# Patient Record
Sex: Female | Born: 1960
Health system: Southern US, Community
[De-identification: ages and names within clinical notes are randomized; demographics above are authoritative.]

## PROBLEM LIST (undated history)

## (undated) DIAGNOSIS — I10 Essential (primary) hypertension: Secondary | ICD-10-CM

## (undated) DIAGNOSIS — M1990 Unspecified osteoarthritis, unspecified site: Secondary | ICD-10-CM

## (undated) DIAGNOSIS — F988 Other specified behavioral and emotional disorders with onset usually occurring in childhood and adolescence: Secondary | ICD-10-CM

## (undated) DIAGNOSIS — L409 Psoriasis, unspecified: Secondary | ICD-10-CM

## (undated) DIAGNOSIS — G2581 Restless legs syndrome: Secondary | ICD-10-CM

## (undated) DIAGNOSIS — F32A Depression, unspecified: Secondary | ICD-10-CM

## (undated) DIAGNOSIS — K219 Gastro-esophageal reflux disease without esophagitis: Secondary | ICD-10-CM

## (undated) HISTORY — PX: TONSILLECTOMY: SUR1361

---

## 1996-03-27 HISTORY — PX: DILATION AND CURETTAGE OF UTERUS: SHX78

## 2016-01-10 DIAGNOSIS — G4709 Other insomnia: Secondary | ICD-10-CM | POA: Diagnosis not present

## 2016-01-10 DIAGNOSIS — E668 Other obesity: Secondary | ICD-10-CM | POA: Diagnosis not present

## 2016-01-10 DIAGNOSIS — F331 Major depressive disorder, recurrent, moderate: Secondary | ICD-10-CM | POA: Diagnosis not present

## 2016-01-10 DIAGNOSIS — I10 Essential (primary) hypertension: Secondary | ICD-10-CM | POA: Diagnosis not present

## 2016-01-10 DIAGNOSIS — M150 Primary generalized (osteo)arthritis: Secondary | ICD-10-CM | POA: Diagnosis not present

## 2016-01-10 DIAGNOSIS — F5081 Binge eating disorder: Secondary | ICD-10-CM | POA: Diagnosis not present

## 2016-01-18 ENCOUNTER — Ambulatory Visit: Payer: Self-pay | Admitting: Family Medicine

## 2016-02-07 ENCOUNTER — Encounter (INDEPENDENT_AMBULATORY_CARE_PROVIDER_SITE_OTHER): Payer: Self-pay | Admitting: Physician Assistant

## 2016-02-07 ENCOUNTER — Ambulatory Visit (INDEPENDENT_AMBULATORY_CARE_PROVIDER_SITE_OTHER): Payer: 59 | Admitting: Physician Assistant

## 2016-02-07 ENCOUNTER — Ambulatory Visit (INDEPENDENT_AMBULATORY_CARE_PROVIDER_SITE_OTHER): Payer: Self-pay

## 2016-02-07 VITALS — Ht 64.0 in | Wt 190.0 lb

## 2016-02-07 DIAGNOSIS — M25561 Pain in right knee: Secondary | ICD-10-CM

## 2016-02-07 MED ORDER — LIDOCAINE HCL 1 % IJ SOLN
1.0000 mL | INTRAMUSCULAR | Status: AC | PRN
  Administered 2016-02-07: 1 mL

## 2016-02-07 MED ORDER — METHYLPREDNISOLONE ACETATE 40 MG/ML IJ SUSP
40.0000 mg | INTRAMUSCULAR | Status: AC | PRN
  Administered 2016-02-07: 40 mg via INTRA_ARTICULAR

## 2016-02-07 NOTE — Progress Notes (Signed)
Office Visit Note   Patient: [name]           Date of Birth: 1960/06/20           MRN: [id number] Visit Date: 02/07/2016              Requested by: No referring provider defined for this encounter. PCP: No primary care provider on file.   Assessment & Plan: Visit Diagnoses:  1. Acute pain of right knee     Plan: Quad strengthening . If knee pain persist would recommend MRI of the right knee to rule out medial meniscal tear. Re- Examination in 2 weeks.  Follow-Up Instructions: Return in about 2 weeks (around 02/21/2016).   Orders:  Orders Placed This Encounter  Procedures  . XR Knee 1-2 Views Right   No orders of the defined types were placed in this encounter.     Procedures: Large Joint Inj Date/Time: 02/07/2016 11:49 AM Performed by: Kirtland BouchardLARK, Saraia Platner W Authorized by: Kirtland BouchardLARK, Roberto Hlavaty W   Consent Given by:  Patient Indications:  Pain Location:  Knee Site:  R knee Needle Size:  22 G Approach:  Anterolateral Ultrasound Guidance: No   Fluoroscopic Guidance: No   Medications:  1 mL lidocaine 1 %; 40 mg methylPREDNISolone acetate 40 MG/ML Aspiration Attempted: Yes   Aspirate amount (mL):  12 Patient tolerance:  Patient tolerated the procedure well with no immediate complications     Clinical Data: No additional findings.   Subjective: Chief Complaint  Patient presents with  . Right Knee - Pain    Pain in right knee for about 8 weeks. States pain came on after using bike and treadmill for about 4 days in row. States that her knee pops in and out, limited range of motion, and swelling. Wears hinge knee brace while working. She notes no catching locking or painful popping. 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    Review of Systems See HPI  Objective: Vital Signs: Ht 5\' 4"  (1.626 m)   Wt 190 lb (86.2 kg)   BMI 32.61 kg/m   Physical Exam  Constitutional: She is oriented to person, place, and time. She appears  well-developed and well-nourished.  Pulmonary/Chest: Effort normal.  Musculoskeletal:       Right knee: She exhibits effusion.       Left knee: She exhibits no effusion.  Neurological: She is alert and oriented to person, place, and time.  Psychiatric: She has a normal mood and affect.    Right Knee Exam   Tenderness  The patient is experiencing tenderness in the medial joint line.  Range of Motion  Extension: normal  Flexion: normal   Tests  McMurray:  Medial - negative Lateral - negative Varus: negative Valgus: negative  Other  Erythema: present Swelling: moderate Other tests: effusion present   Left Knee Exam  Left knee exam is normal.  Range of Motion  Extension: normal  Flexion: normal   Tests  McMurray:  Medial - negative Lateral - negative Varus: negative Valgus: negative  Other  Effusion: no effusion present      Specialty Comments:  No specialty comments available.  Imaging: Xr Knee 1-2 Views Right  Result Date: 02/07/2016  AP view bilateral knees and lateral view of the right knee: No acute fractures. Left knee with near bone-on-bone medial compartment and lateral compartment spurring. Right knee with moderate narrowing of the lateral compartment. No other bony abnormalities.  PMFS History: There are no active problems to display for this patient.  No past medical history on file.  No family history on file.  No past surgical history on file. Social History   Occupational History  . Not on file.   Social History Main Topics  . Smoking status: Never Smoker  . Smokeless tobacco: Never Used  . Alcohol use No  . Drug use: No  . Sexual activity: Not on file

## 2016-02-23 ENCOUNTER — Ambulatory Visit (INDEPENDENT_AMBULATORY_CARE_PROVIDER_SITE_OTHER): Payer: 59 | Admitting: Physician Assistant

## 2016-08-10 ENCOUNTER — Ambulatory Visit (INDEPENDENT_AMBULATORY_CARE_PROVIDER_SITE_OTHER): Payer: 59 | Admitting: Physician Assistant

## 2016-09-07 DIAGNOSIS — E669 Obesity, unspecified: Secondary | ICD-10-CM | POA: Diagnosis not present

## 2016-09-07 DIAGNOSIS — I10 Essential (primary) hypertension: Secondary | ICD-10-CM | POA: Diagnosis not present

## 2016-09-07 DIAGNOSIS — G4700 Insomnia, unspecified: Secondary | ICD-10-CM | POA: Diagnosis not present

## 2016-09-07 DIAGNOSIS — F329 Major depressive disorder, single episode, unspecified: Secondary | ICD-10-CM | POA: Diagnosis not present

## 2016-09-07 DIAGNOSIS — Z7689 Persons encountering health services in other specified circumstances: Secondary | ICD-10-CM | POA: Diagnosis not present

## 2016-09-07 DIAGNOSIS — R238 Other skin changes: Secondary | ICD-10-CM | POA: Diagnosis not present

## 2016-09-07 DIAGNOSIS — Z6835 Body mass index (BMI) 35.0-35.9, adult: Secondary | ICD-10-CM | POA: Diagnosis not present

## 2016-09-07 DIAGNOSIS — F5081 Binge eating disorder: Secondary | ICD-10-CM | POA: Diagnosis not present

## 2016-09-08 ENCOUNTER — Other Ambulatory Visit: Payer: Self-pay

## 2016-09-08 ENCOUNTER — Ambulatory Visit (HOSPITAL_BASED_OUTPATIENT_CLINIC_OR_DEPARTMENT_OTHER)
Admission: RE | Admit: 2016-09-08 | Discharge: 2016-09-08 | Disposition: A | Discharge status: Home / Self Care | Payer: 59 | Source: Ambulatory Visit | Attending: Family Medicine | Admitting: Family Medicine

## 2016-09-08 ENCOUNTER — Other Ambulatory Visit (HOSPITAL_BASED_OUTPATIENT_CLINIC_OR_DEPARTMENT_OTHER): Payer: Self-pay | Admitting: Family Medicine

## 2016-09-08 ENCOUNTER — Encounter (HOSPITAL_BASED_OUTPATIENT_CLINIC_OR_DEPARTMENT_OTHER): Payer: Self-pay

## 2016-09-08 DIAGNOSIS — Z1231 Encounter for screening mammogram for malignant neoplasm of breast: Secondary | ICD-10-CM

## 2016-10-06 DIAGNOSIS — E782 Mixed hyperlipidemia: Secondary | ICD-10-CM | POA: Diagnosis not present

## 2016-10-06 DIAGNOSIS — N951 Menopausal and female climacteric states: Secondary | ICD-10-CM | POA: Diagnosis not present

## 2016-10-06 DIAGNOSIS — L409 Psoriasis, unspecified: Secondary | ICD-10-CM | POA: Diagnosis not present

## 2016-10-06 DIAGNOSIS — E559 Vitamin D deficiency, unspecified: Secondary | ICD-10-CM | POA: Diagnosis not present

## 2016-10-06 DIAGNOSIS — Z7689 Persons encountering health services in other specified circumstances: Secondary | ICD-10-CM | POA: Diagnosis not present

## 2016-10-06 DIAGNOSIS — R41840 Attention and concentration deficit: Secondary | ICD-10-CM | POA: Diagnosis not present

## 2016-10-06 DIAGNOSIS — F5081 Binge eating disorder: Secondary | ICD-10-CM | POA: Diagnosis not present

## 2016-10-06 DIAGNOSIS — G4700 Insomnia, unspecified: Secondary | ICD-10-CM | POA: Diagnosis not present

## 2016-10-06 DIAGNOSIS — Z008 Encounter for other general examination: Secondary | ICD-10-CM | POA: Diagnosis not present

## 2016-10-06 DIAGNOSIS — I10 Essential (primary) hypertension: Secondary | ICD-10-CM | POA: Diagnosis not present

## 2016-12-28 ENCOUNTER — Ambulatory Visit: Payer: 59 | Admitting: Osteopathic Medicine

## 2017-01-09 ENCOUNTER — Ambulatory Visit: Payer: 59 | Admitting: Osteopathic Medicine

## 2017-01-09 DIAGNOSIS — Z0189 Encounter for other specified special examinations: Secondary | ICD-10-CM

## 2017-01-26 MED FILL — AMPHETAMINE SALTS 30 MG TAB: 30 | 30 days supply | Qty: 30 | Fill #0

## 2017-03-26 MED FILL — cloNIDine HCL 0.2 MG TABS: 0.2 | 90 days supply | Qty: 90 | Fill #0

## 2017-05-01 ENCOUNTER — Encounter (INDEPENDENT_AMBULATORY_CARE_PROVIDER_SITE_OTHER): Payer: 59

## 2017-05-08 ENCOUNTER — Ambulatory Visit (INDEPENDENT_AMBULATORY_CARE_PROVIDER_SITE_OTHER): Payer: 59 | Admitting: Family Medicine

## 2017-06-05 ENCOUNTER — Ambulatory Visit: Payer: 59 | Admitting: Family Medicine

## 2017-06-05 ENCOUNTER — Telehealth: Payer: Self-pay | Admitting: *Deleted

## 2017-06-05 DIAGNOSIS — Z0289 Encounter for other administrative examinations: Secondary | ICD-10-CM

## 2017-06-05 NOTE — Telephone Encounter (Signed)
Copied from CRM [id number]. Topic: Quick Communication - Appointment Cancellation >> Jun 05, 2017 12:11 PM Cecelia ByarsGreen, Temeka L, RMA wrote: Patient called to cancel appointment scheduled for 06/05/17 @ 2:15 pm. Patient has rescheduled their appointment.

## 2017-07-02 ENCOUNTER — Encounter (INDEPENDENT_AMBULATORY_CARE_PROVIDER_SITE_OTHER): Payer: Self-pay

## 2017-07-03 ENCOUNTER — Ambulatory Visit: Payer: 59 | Admitting: Family Medicine

## 2017-09-14 ENCOUNTER — Ambulatory Visit: Payer: 59 | Admitting: Podiatry

## 2017-10-29 ENCOUNTER — Ambulatory Visit (HOSPITAL_BASED_OUTPATIENT_CLINIC_OR_DEPARTMENT_OTHER)
Admission: RE | Admit: 2017-10-29 | Discharge: 2017-10-29 | Disposition: A | Discharge status: Home / Self Care | Payer: 59 | Source: Ambulatory Visit | Attending: Family Medicine | Admitting: Family Medicine

## 2017-10-29 ENCOUNTER — Other Ambulatory Visit (HOSPITAL_BASED_OUTPATIENT_CLINIC_OR_DEPARTMENT_OTHER): Payer: Self-pay | Admitting: Family Medicine

## 2017-10-29 DIAGNOSIS — Z1231 Encounter for screening mammogram for malignant neoplasm of breast: Secondary | ICD-10-CM | POA: Diagnosis not present

## 2017-10-31 DIAGNOSIS — N951 Menopausal and female climacteric states: Secondary | ICD-10-CM | POA: Diagnosis not present

## 2017-10-31 DIAGNOSIS — R635 Abnormal weight gain: Secondary | ICD-10-CM | POA: Diagnosis not present

## 2017-11-01 DIAGNOSIS — I10 Essential (primary) hypertension: Secondary | ICD-10-CM | POA: Diagnosis not present

## 2017-11-01 DIAGNOSIS — E669 Obesity, unspecified: Secondary | ICD-10-CM | POA: Diagnosis not present

## 2017-11-01 DIAGNOSIS — F329 Major depressive disorder, single episode, unspecified: Secondary | ICD-10-CM | POA: Diagnosis not present

## 2017-11-01 DIAGNOSIS — M2550 Pain in unspecified joint: Secondary | ICD-10-CM | POA: Diagnosis not present

## 2017-11-01 DIAGNOSIS — R635 Abnormal weight gain: Secondary | ICD-10-CM | POA: Diagnosis not present

## 2017-11-01 DIAGNOSIS — Z1331 Encounter for screening for depression: Secondary | ICD-10-CM | POA: Diagnosis not present

## 2017-11-01 DIAGNOSIS — Z1339 Encounter for screening examination for other mental health and behavioral disorders: Secondary | ICD-10-CM | POA: Diagnosis not present

## 2017-11-01 DIAGNOSIS — R4586 Emotional lability: Secondary | ICD-10-CM | POA: Diagnosis not present

## 2017-11-01 DIAGNOSIS — R6882 Decreased libido: Secondary | ICD-10-CM | POA: Diagnosis not present

## 2017-11-01 DIAGNOSIS — R5383 Other fatigue: Secondary | ICD-10-CM | POA: Diagnosis not present

## 2017-11-06 ENCOUNTER — Ambulatory Visit: Payer: 59 | Admitting: Family Medicine

## 2017-11-06 ENCOUNTER — Encounter: Payer: Self-pay | Admitting: Family Medicine

## 2017-11-06 VITALS — BP 167/100 | HR 84 | Ht 63.0 in | Wt 224.0 lb

## 2017-11-06 DIAGNOSIS — M25562 Pain in left knee: Secondary | ICD-10-CM | POA: Diagnosis not present

## 2017-11-06 DIAGNOSIS — G8929 Other chronic pain: Secondary | ICD-10-CM | POA: Diagnosis not present

## 2017-11-06 MED ORDER — METHYLPREDNISOLONE ACETATE 40 MG/ML IJ SUSP
40.0000 mg | Freq: Once | INTRAMUSCULAR | Status: AC
  Administered 2017-11-06: 40 mg via INTRA_ARTICULAR

## 2017-11-06 NOTE — Patient Instructions (Signed)
Your pain is due to arthritis. These are the different medications you can take for this: Tylenol 500mg  1-2 tabs three times a day for pain. Capsaicin, aspercreme, or biofreeze topically up to four times a day may also help with pain. Some supplements that may help for arthritis: Boswellia extract, curcumin, pycnogenol Aleve 1-2 tabs twice a day with food Cortisone injections are an option - you were given this today. If cortisone injections do not help, there are different types of shots that may help but they take longer to take effect. It's important that you continue to stay active. Straight leg raises, knee extensions 3 sets of 10 once a day (add ankle weight if these become too easy). Consider physical therapy to strengthen muscles around the joint that hurts to take pressure off of the joint itself. Shoe inserts with good arch support may be helpful. Heat or ice 15 minutes at a time 3-4 times a day as needed to help with pain. Water aerobics and cycling with low resistance are the best two types of exercise for arthritis though any exercise is ok as long as it doesn't worsen the pain. Wait about 2 weeks before you get back on the treadmill - only do it every other day start at 15-20 minutes, increase by 5 minutes each time you exercise if tolerated. Avoid squats, lunges, leg press. Follow up with me in 1 month.

## 2017-11-07 ENCOUNTER — Encounter: Payer: Self-pay | Admitting: Family Medicine

## 2017-11-07 NOTE — Progress Notes (Signed)
PCP: System, Pcp Not In  Subjective:   HPI: Patient is a 57 y.o. female here for left knee pain.  Patient reports that she has had problems with her left knee off and on the past 2 years though it has been much worse the past 3 weeks. She has [facility] that showed bone-on-bone arthritis medially. 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 Pain is medial with some associated swelling. She is tried a knee brace and Aleve with mild benefit. No skin changes or numbness.  History reviewed. No pertinent past medical history.  Current Outpatient Medications on File Prior to Visit  Medication Sig Dispense Refill  . traMADol (ULTRAM) 50 MG tablet TK 1 T PO BID PRN P  1   No current facility-administered medications on file prior to visit.     History reviewed. No pertinent surgical history.  No Known Allergies  Social History   Socioeconomic History  . Marital status: Divorced    Spouse name: Not on file  . Number of children: Not on file  . Years of education: Not on file  . Highest education level: Not on file  Occupational History  . Not on file  Social Needs  . Financial resource strain: Not on file  . Food insecurity:    Worry: Not on file    Inability: Not on file  . Transportation needs:    Medical: Not on file    Non-medical: Not on file  Tobacco Use  . Smoking status: Never Smoker  . Smokeless tobacco: Never Used  Substance and Sexual Activity  . Alcohol use: No  . Drug use: No  . Sexual activity: Not on file  Lifestyle  . Physical activity:    Days per week: Not on file    Minutes per session: Not on file  . Stress: Not on file  Relationships  . Social connections:    Talks on phone: Not on file    Gets together: Not on file    Attends religious service: Not on file    Active member of club or organization: Not on file    Attends meetings of  clubs or organizations: Not on file    Relationship status: Not on file  . Intimate partner violence:    Fear of current or ex partner: Not on file    Emotionally abused: Not on file    Physically abused: Not on file    Forced sexual activity: Not on file  Other Topics Concern  . Not on file  Social History Narrative  . Not on file    History reviewed. No pertinent family history.  BP (!) 167/100   Pulse 84   Ht 5\' 3"  (1.6 m)   Wt 224 lb (101.6 kg)   BMI 39.68 kg/m   Review of Systems: See HPI above.     Objective:  Physical Exam:  Gen: NAD, comfortable in exam room  Left knee: No gross deformity, ecchymoses.  Mild effusion. TTP medial joint line. FROM with 5/5 strength flexion and extension. Negative ant/post drawers. Negative valgus/varus testing. Negative lachmanns. Negative mcmurrays, apleys, patellar apprehension. NV intact distally.  Right knee: No deformity. FROM with 5/5 strength. No tenderness to palpation. NVI distally.   Assessment & Plan:  1. Left knee pain - secondary to known arthritis which is at least moderate based on muscular skeletal ultrasound.  Prior radiographs about 1 to 2 years ago showed severe end-stage arthritis.  She was given an intra-articular injection today Depo-Medrol.  We discussed Tylenol, topical medications, supplements that might help, Aleve.  She will consider Visco supplementation.  Shown exercises to do daily.  Heat or ice.  We discussed return to walking on the treadmill and how to go about this.  Avoid squats, lunges, leg press.  Follow-up in 1 month.  After informed written consent timeout was performed, patient was seated on exam table. Left knee was prepped with alcohol swab and utilizing anteromedial approach, patient's left knee was injected intraarticularly with 3:1 bupivicaine: depomedrol. Patient tolerated the procedure well without immediate complications.

## 2017-11-12 DIAGNOSIS — E669 Obesity, unspecified: Secondary | ICD-10-CM | POA: Diagnosis not present

## 2017-11-12 DIAGNOSIS — I10 Essential (primary) hypertension: Secondary | ICD-10-CM | POA: Diagnosis not present

## 2017-11-12 DIAGNOSIS — Z713 Dietary counseling and surveillance: Secondary | ICD-10-CM | POA: Diagnosis not present

## 2017-12-10 ENCOUNTER — Ambulatory Visit: Payer: 59 | Admitting: Family Medicine

## 2017-12-25 ENCOUNTER — Encounter: Payer: Self-pay | Admitting: Medical

## 2017-12-25 ENCOUNTER — Ambulatory Visit: Payer: 59 | Admitting: Medical

## 2017-12-25 VITALS — BP 153/85 | HR 99 | Temp 98.4°F | Ht 64.0 in | Wt 218.4 lb

## 2017-12-25 DIAGNOSIS — F419 Anxiety disorder, unspecified: Secondary | ICD-10-CM

## 2017-12-25 DIAGNOSIS — Z113 Encounter for screening for infections with a predominantly sexual mode of transmission: Secondary | ICD-10-CM | POA: Diagnosis not present

## 2017-12-25 DIAGNOSIS — I10 Essential (primary) hypertension: Secondary | ICD-10-CM

## 2017-12-25 DIAGNOSIS — M25541 Pain in joints of right hand: Secondary | ICD-10-CM | POA: Diagnosis not present

## 2017-12-25 DIAGNOSIS — F32A Depression, unspecified: Secondary | ICD-10-CM

## 2017-12-25 DIAGNOSIS — M25542 Pain in joints of left hand: Secondary | ICD-10-CM | POA: Diagnosis not present

## 2017-12-25 DIAGNOSIS — F988 Other specified behavioral and emotional disorders with onset usually occurring in childhood and adolescence: Secondary | ICD-10-CM

## 2017-12-25 DIAGNOSIS — Z124 Encounter for screening for malignant neoplasm of cervix: Secondary | ICD-10-CM | POA: Diagnosis not present

## 2017-12-25 DIAGNOSIS — F329 Major depressive disorder, single episode, unspecified: Secondary | ICD-10-CM

## 2017-12-25 MED ORDER — BUPROPION HCL ER (SR) 150 MG PO TB12: 150.0000 mg | ORAL_TABLET | Freq: Two times a day (BID) | ORAL | 0 refills | Status: DC

## 2017-12-25 MED ORDER — FLUOXETINE HCL 40 MG PO CAPS: 40.0000 mg | ORAL_CAPSULE | Freq: Every day | ORAL | 0 refills | Status: DC

## 2017-12-25 MED ORDER — LISINOPRIL 20 MG PO TABS: 20.0000 mg | ORAL_TABLET | Freq: Every day | ORAL | 3 refills | Status: DC

## 2017-12-25 MED ORDER — QUETIAPINE FUMARATE 50 MG PO TABS: 50.0000 mg | ORAL_TABLET | Freq: Every day | ORAL | 0 refills | Status: DC

## 2017-12-25 MED FILL — BUPROPION SR 150 MG TABLET: 150 | 90 days supply | Qty: 180 | Fill #0

## 2017-12-25 MED FILL — FLUoxetine HCL 40 MG CAPS: 40 | 90 days supply | Qty: 90 | Fill #0

## 2017-12-25 MED FILL — LISINOPRIL 20 MG TABLET: 20 | 90 days supply | Qty: 90 | Fill #0

## 2017-12-25 MED FILL — QUETIAPINE FUMARATE 50 MG T: 50 | 90 days supply | Qty: 90 | Fill #0

## 2017-12-25 NOTE — Progress Notes (Signed)
Subjective:    Patient ID: Bryna Colander, female    DOB: 13-Oct-1960, 57 y.o.   MRN: [id number]  HPI  Pt in for first time. She is new to area. She works as ED Engineer, civil (consulting). She does not work out regularly.    Pt does had hx of knee arthritis. Pt sees Dr. Pearletha Forge. Pt has some recent left shin pain about 2 weeks ago. She states after knee injection.  Pt bp is moderate high today. She states 160/110 at work in ED. She been out of bp med since more than a year/15 months.  Pt has hx of depression, anxiety and ADD. Pt states mood overall stable but know she feels better and functions better with her meds.  Pt did see [facility] to try to loose weight. Pt had labs done. She will bring those in and we can scan.   She also reports wrist and hand pain bilaterally. More pain in last year.   Pt declines gi referral for colonosocopy presently.  Pt states menopausal.  Review of Systems  Constitutional: Negative for chills, fatigue and fever.  HENT: Negative for congestion, drooling, ear pain, facial swelling, hearing loss, sinus pressure and sinus pain.   Respiratory: Negative for cough, chest tightness, shortness of breath and wheezing.   Cardiovascular: Negative for chest pain and palpitations.  Gastrointestinal: Negative for abdominal pain.  Genitourinary: Negative for decreased urine volume, difficulty urinating, flank pain, frequency and pelvic pain.  Musculoskeletal: Negative for back pain, joint swelling, neck pain and neck stiffness.  Skin: Negative for rash.  Neurological: Negative for dizziness, tremors, seizures, syncope, weakness, numbness and headaches.  Hematological: Negative for adenopathy. Does not bruise/bleed easily.  Psychiatric/Behavioral: Positive for dysphoric mood and sleep disturbance. Negative for suicidal ideas. The patient is nervous/anxious.        Controlled with meds. Pt assures me large number of tabs not risk. She is always relatively stable.    No past medical history  on file.   Social History   Socioeconomic History  . Marital status: Divorced    Spouse name: Not on file  . Number of children: Not on file  . Years of education: Not on file  . Highest education level: Not on file  Occupational History  . Not on file  Social Needs  . Financial resource strain: Not on file  . Food insecurity:    Worry: Not on file    Inability: Not on file  . Transportation needs:    Medical: Not on file    Non-medical: Not on file  Tobacco Use  . Smoking status: Never Smoker  . Smokeless tobacco: Never Used  Substance and Sexual Activity  . Alcohol use: No  . Drug use: No  . Sexual activity: Not on file  Lifestyle  . Physical activity:    Days per week: Not on file    Minutes per session: Not on file  . Stress: Not on file  Relationships  . Social connections:    Talks on phone: Not on file    Gets together: Not on file    Attends religious service: Not on file    Active member of club or organization: Not on file    Attends meetings of clubs or organizations: Not on file    Relationship status: Not on file  . Intimate partner violence:    Fear of current or ex partner: Not on file    Emotionally abused: Not on file    Physically  abused: Not on file    Forced sexual activity: Not on file  Other Topics Concern  . Not on file  Social History Narrative  . Not on file    No past surgical history on file.  No family history on file.  No Known Allergies  No current outpatient medications on file prior to visit.   No current facility-administered medications on file prior to visit.     BP (!) 143/81 (BP Location: Left Arm, Patient Position: Sitting, Cuff Size: Large)   Pulse 99   Temp 98.4 F (36.9 C) (Oral)   Ht 5\' 4"  (1.626 m) Comment: Per patient  Wt 218 lb 6.4 oz (99.1 kg)   LMP 12/25/2012 (Approximate)   SpO2 99%   BMI 37.49 kg/m       Objective:   Physical Exam  General Mental Status- Alert. General Appearance- Not in  acute distress.   Skin General: Color- Normal Color. Moisture- Normal Moisture.  Neck Carotid Arteries- Normal color. Moisture- Normal Moisture. No carotid bruits. No JVD.  Chest and Lung Exam Auscultation: Breath Sounds:-Normal.  Cardiovascular Auscultation:Rythm- Regular. Murmurs & Other Heart Sounds:Auscultation of the heart reveals- No Murmurs.  Abdomen Inspection:-Inspeection Normal. Palpation/Percussion:Note:No mass. Palpation and Percussion of the abdomen reveal- Non Tender, Non Distended + BS, no rebound or guarding.   Neurologic Cranial Nerve exam:- CN III-XII intact(No nystagmus), symmetric smile. Strength:- 5/5 equal and symmetric strength both upper and lower extremities.      Assessment & Plan:  Your blood pressure is moderately elevated presently but you have not been on your medications.  So I did refill your lisinopril.  For history of ADD, I did refill your Wellbutrin.  For history of anxiety and depression, I did refill your fluoxetine and Seroquel.  For recent arthralgias of both hands over the past month, I did place future arthritis panel.  When those studies are back may refer to rheumatologist.  Also can refer to rheumatologist even if labs are negative particularly if you worsen clinically.  For STD screening, did place HIV future lab.  For cervical cancer screening, did place referral to gynecologist.  Let me know when you are ready to be referred to gastroenterology for screening colonoscopy.  Follow-up in 3 months or as needed.  But please my chart me in 1 month let me know how you feel or sooner.  Esperanza Richters, PA-C

## 2017-12-25 NOTE — Patient Instructions (Signed)
Your blood pressure is moderately elevated presently but you have not been on your medications.  So I did refill your lisinopril.  For history of ADD, I did refill your Wellbutrin.  For history of anxiety and depression, I did refill your fluoxetine and Seroquel.  For recent arthralgias of both hands over the past month, I did place future arthritis panel.  When those studies are back may refer to rheumatologist.  Also can refer to rheumatologist even if labs are negative particularly if you worsen clinically.  For STD screening, did place HIV future lab.  For cervical cancer screening, did place referral to gynecologist.  Let me know when you are ready to be referred to gastroenterology for screening colonoscopy.  Follow-up in 3 months or as needed.  But please my chart me in 1 month let me know how you feel or sooner.

## 2017-12-27 ENCOUNTER — Encounter: Payer: Self-pay | Admitting: Family Medicine

## 2017-12-28 ENCOUNTER — Other Ambulatory Visit: Payer: 59

## 2017-12-28 ENCOUNTER — Ambulatory Visit: Payer: 59 | Admitting: Family Medicine

## 2018-01-04 ENCOUNTER — Other Ambulatory Visit: Payer: 59

## 2018-01-09 ENCOUNTER — Other Ambulatory Visit: Payer: 59

## 2018-01-13 ENCOUNTER — Encounter: Payer: Self-pay | Admitting: Family Medicine

## 2018-01-14 ENCOUNTER — Ambulatory Visit: Payer: 59 | Admitting: Family Medicine

## 2018-01-15 ENCOUNTER — Other Ambulatory Visit: Payer: 59

## 2018-03-06 ENCOUNTER — Encounter: Payer: Self-pay | Admitting: Medical

## 2018-03-07 ENCOUNTER — Ambulatory Visit: Payer: 59 | Admitting: Medical

## 2018-03-10 ENCOUNTER — Other Ambulatory Visit: Payer: Self-pay | Admitting: Medical

## 2018-03-14 MED ORDER — BUPROPION HCL ER (SR) 150 MG PO TB12: 150.0000 mg | ORAL_TABLET | Freq: Two times a day (BID) | ORAL | 0 refills | Status: DC

## 2018-03-14 MED ORDER — QUETIAPINE FUMARATE 50 MG PO TABS: ORAL_TABLET | ORAL | 0 refills | Status: DC

## 2018-03-14 MED ORDER — FLUOXETINE HCL 40 MG PO CAPS: 40.0000 mg | ORAL_CAPSULE | Freq: Every day | ORAL | 0 refills | Status: DC

## 2018-03-15 ENCOUNTER — Ambulatory Visit: Payer: 59 | Admitting: Medical

## 2018-03-15 ENCOUNTER — Other Ambulatory Visit: Payer: Self-pay | Admitting: Medical

## 2018-03-18 ENCOUNTER — Ambulatory Visit: Payer: 59 | Admitting: Medical

## 2018-03-18 DIAGNOSIS — Z0289 Encounter for other administrative examinations: Secondary | ICD-10-CM

## 2018-03-25 ENCOUNTER — Encounter: Payer: Self-pay | Admitting: Medical

## 2018-03-25 ENCOUNTER — Ambulatory Visit: Payer: 59 | Admitting: Medical

## 2018-03-25 VITALS — BP 138/90 | HR 74 | Temp 97.9°F | Resp 16 | Ht 64.0 in | Wt 217.4 lb

## 2018-03-25 DIAGNOSIS — L409 Psoriasis, unspecified: Secondary | ICD-10-CM

## 2018-03-25 DIAGNOSIS — Z113 Encounter for screening for infections with a predominantly sexual mode of transmission: Secondary | ICD-10-CM | POA: Diagnosis not present

## 2018-03-25 DIAGNOSIS — I10 Essential (primary) hypertension: Secondary | ICD-10-CM

## 2018-03-25 DIAGNOSIS — K219 Gastro-esophageal reflux disease without esophagitis: Secondary | ICD-10-CM | POA: Diagnosis not present

## 2018-03-25 DIAGNOSIS — F329 Major depressive disorder, single episode, unspecified: Secondary | ICD-10-CM

## 2018-03-25 DIAGNOSIS — F988 Other specified behavioral and emotional disorders with onset usually occurring in childhood and adolescence: Secondary | ICD-10-CM | POA: Diagnosis not present

## 2018-03-25 DIAGNOSIS — M2550 Pain in unspecified joint: Secondary | ICD-10-CM

## 2018-03-25 DIAGNOSIS — F32A Depression, unspecified: Secondary | ICD-10-CM

## 2018-03-25 LAB — C-REACTIVE PROTEIN: CRP: 1.3 mg/dL (ref 0.5–20.0)

## 2018-03-25 LAB — SEDIMENTATION RATE: Sed Rate: 24 mm/hr (ref 0–30)

## 2018-03-25 MED ORDER — OMEPRAZOLE 20 MG PO CPDR: 20.0000 mg | DELAYED_RELEASE_CAPSULE | Freq: Every day | ORAL | 3 refills | Status: DC

## 2018-03-25 MED ORDER — HYDROXYZINE HCL 25 MG PO TABS: ORAL_TABLET | ORAL | 0 refills | Status: DC

## 2018-03-25 MED ORDER — KETOCONAZOLE 2 % EX SHAM: 1.0000 "application " | MEDICATED_SHAMPOO | CUTANEOUS | 1 refills | Status: DC

## 2018-03-25 MED ORDER — QUETIAPINE FUMARATE 100 MG PO TABS: 100.0000 mg | ORAL_TABLET | Freq: Every day | ORAL | 1 refills | Status: DC

## 2018-03-25 MED ORDER — CLOBETASOL PROPIONATE 0.05 % EX OINT: 1.0000 "application " | TOPICAL_OINTMENT | Freq: Two times a day (BID) | CUTANEOUS | 1 refills | Status: DC

## 2018-03-25 MED FILL — CLOBETASOL PROP 0.05% OINT: 0.05 | 15 days supply | Qty: 30 | Fill #0

## 2018-03-25 MED FILL — KETOCONAZOLE 2% SHAMPOO: 2 | 90 days supply | Qty: 120 | Fill #0

## 2018-03-25 MED FILL — QUETIAPINE FUMARATE 100 MG: 100 | 90 days supply | Qty: 90 | Fill #0

## 2018-03-25 MED FILL — BUPROPION SR 150 MG TABLET: 150 | 90 days supply | Qty: 180 | Fill #0

## 2018-03-25 MED FILL — FLUoxetine HCL 40 MG CAPS: 40 | 90 days supply | Qty: 90 | Fill #0

## 2018-03-25 MED FILL — OMEPRAZOLE 20 MG CPDR: 20 | 30 days supply | Qty: 30 | Fill #0

## 2018-03-25 MED FILL — LISINOPRIL 20 MG TABLET: 20 | 90 days supply | Qty: 90 | Fill #1

## 2018-03-25 MED FILL — hydrOXYzine HCL 25 MG TABS: 25 | 30 days supply | Qty: 30 | Fill #0

## 2018-03-25 NOTE — Progress Notes (Signed)
Subjective:    Patient ID: Sarah ColanderSophie Strough, female    DOB: 1960-04-21, 57 y.o.   MRN: [id number]  HPI  Pt in today and she states has not taking lisinopril today. Pt last took lisinopril 5:30 am. No cardiac or neurologic signs or symptoms.  Pt has hx of depression, insomnia, anxiety and ADD. Pt has been on wellbutrin and seroquel(before I started seeing her). She states not bipolor. She has taken seroquel to help with insomnia. She states side effects of seroquel allows her to sleep. She does not want to be on a hypnotic.  Hx of psoriasis. Flares behind her ears. She uses clobestol ocassionaly about twice a year.   Gerd signs and symptoms for past 2 months on and off. Worse past 6 months.   Review of Systems  Constitutional: Negative for chills and fatigue.  HENT: Negative for congestion, ear pain and facial swelling.   Respiratory: Negative for cough, chest tightness, shortness of breath and wheezing.   Cardiovascular: Negative for chest pain and palpitations.  Gastrointestinal: Negative for abdominal distention, abdominal pain and diarrhea.       Bad gerd when she drinks coffee or eats greasy.  Genitourinary: Negative for difficulty urinating, flank pain, frequency, pelvic pain and urgency.  Musculoskeletal: Negative for back pain and neck stiffness.  Skin: Positive for rash.  Neurological: Negative for dizziness, speech difficulty, weakness, numbness and headaches.  Hematological: Negative for adenopathy. Does not bruise/bleed easily.  Psychiatric/Behavioral: Positive for decreased concentration and dysphoric mood. Negative for behavioral problems, confusion and sleep disturbance. The patient is not hyperactive.     No past medical history on file.   Social History   Socioeconomic History  . Marital status: Divorced    Spouse name: Not on file  . Number of children: Not on file  . Years of education: Not on file  . Highest education level: Not on file  Occupational History    . Not on file  Social Needs  . Financial resource strain: Not on file  . Food insecurity:    Worry: Not on file    Inability: Not on file  . Transportation needs:    Medical: Not on file    Non-medical: Not on file  Tobacco Use  . Smoking status: Never Smoker  . Smokeless tobacco: Never Used  Substance and Sexual Activity  . Alcohol use: No  . Drug use: No  . Sexual activity: Not on file  Lifestyle  . Physical activity:    Days per week: Not on file    Minutes per session: Not on file  . Stress: Not on file  Relationships  . Social connections:    Talks on phone: Not on file    Gets together: Not on file    Attends religious service: Not on file    Active member of club or organization: Not on file    Attends meetings of clubs or organizations: Not on file    Relationship status: Not on file  . Intimate partner violence:    Fear of current or ex partner: Not on file    Emotionally abused: Not on file    Physically abused: Not on file    Forced sexual activity: Not on file  Other Topics Concern  . Not on file  Social History Narrative  . Not on file    No past surgical history on file.  No family history on file.  No Known Allergies  Current Outpatient Medications on File Prior  to Visit  Medication Sig Dispense Refill  . buPROPion (WELLBUTRIN SR) 150 MG 12 hr tablet Take 1 tablet (150 mg total) by mouth 2 (two) times daily. 180 tablet 0  . FLUoxetine (PROZAC) 40 MG capsule Take 1 capsule (40 mg total) by mouth daily. 90 capsule 0  . lisinopril (PRINIVIL,ZESTRIL) 20 MG tablet Take 1 tablet (20 mg total) by mouth daily. 90 tablet 3  . QUEtiapine (SEROQUEL) 50 MG tablet Take 1-2 tablets by mouth as needed for sleep. 180 tablet 0   No current facility-administered medications on file prior to visit.     BP 138/90   Pulse 74   Temp 97.9 F (36.6 C) (Oral)   Resp 16   Ht 5\' 4"  (1.626 m)   Wt 217 lb 6.4 oz (98.6 kg)   LMP 12/25/2012 (Approximate)   SpO2 97%    BMI 37.32 kg/m       Objective:   Physical Exam  General Mental Status- Alert. General Appearance- Not in acute distress.   Skin General: Color- Normal Color. Moisture- Normal Moisture.  Neck Carotid Arteries- Normal color. Moisture- Normal Moisture. No carotid bruits. No JVD.  Chest and Lung Exam Auscultation: Breath Sounds:-Normal.  Cardiovascular Auscultation:Rythm- Regular. Murmurs & Other Heart Sounds:Auscultation of the heart reveals- No Murmurs.  Abdomen Inspection:-Inspeection Normal. Palpation/Percussion:Note:No mass. Palpation and Percussion of the abdomen reveal- Non Tender, Non Distended + BS, no rebound or guarding.    Neurologic Cranial Nerve exam:- CN III-XII intact(No nystagmus), symmetric smile. Drift Test:- No drift. Romberg Exam:- Negative.  Heal to Toe Gait exam:-Normal. Finger to Nose:- Normal/Intact Strength:- 5/5 equal and symmetric strength both upper and lower extremities.      Assessment & Plan:  Your blood pressure was moderately well controlled on second check with a medical assistant staff.  A little bit higher when I checked.  You were not on your medication so please take lisinopril daily at the same time.  Would also recommend that you check your blood pressure at home and if your blood pressures not consistently less than 140/90 please let us know and we would make appropriate adjustment.  For history of depression, continue Wellbutrin and Seroquel.  He has some described ADD in the past and Wellbutrin may have added benefit of treating ADD.  There are other options but we would need to make sure that you are blood sugar is tightly controlled before considering controlled medication/stimulant options.  For psoriasis, refilled the clobetasol.  Also Rx Nizoral shampoo.  For GERD, I prescribed omeprazole.  EKG done today due to hypertension history and Seroquel use. No lvh seen. No qt prolongation seen.  For insomnia did make  hydroxyzine available.  You can see how this works.  Follow-up in 3 months or as needed.   40 minutes spent with pt today. 50% of time spent on discussing treatment plans for each conditions.   Esperanza RichtersEdward Jaskarn Schweer, PA-C

## 2018-03-25 NOTE — Patient Instructions (Addendum)
Your blood pressure was moderately well controlled on second check with a medical assistant staff.  A little bit higher when I checked.  You were not on your medication so please take lisinopril daily at the same time.  Would also recommend that you check your blood pressure at home and if your blood pressures not consistently less than 140/90 please let us know and we would make appropriate adjustment.  For history of depression, continue Wellbutrin and Seroquel.  He has some described ADD in the past and Wellbutrin may have added benefit of treating ADD.  There are other options but we would need to make sure that you are blood sugar is tightly controlled before considering controlled medication/stimulant options.  For psoriasis, refilled the clobetasol.  Also Rx Nizoral shampoo.  For GERD, I prescribed omeprazole.  EKG done today due to hypertension history and Seroquel use.sinus rhythm. No lvh seen. No qt prolongation seen.  For insomnia did make hydroxyzine available.  You can see how this works.  Follow-up in 3 months or as needed.

## 2018-03-26 ENCOUNTER — Encounter: Payer: Self-pay | Admitting: Medical

## 2018-03-28 LAB — RHEUMATOID FACTOR: Rheumatoid fact SerPl-aCnc: <14 IU/mL (ref <14)

## 2018-03-28 LAB — ANA: Anti Nuclear Antibody(ANA): NEGATIVE

## 2018-03-28 LAB — HIV ANTIBODY (ROUTINE TESTING W REFLEX): HIV 1&2 Ab, 4th Generation: NONREACTIVE

## 2018-03-28 LAB — HLA-B27 ANTIGEN: HLA-B27 ANTIGEN: NEGATIVE

## 2018-04-26 MED FILL — OMEPRAZOLE 20 MG CPDR: 20 | 30 days supply | Qty: 30 | Fill #1

## 2018-05-23 MED FILL — OMEPRAZOLE 20 MG CPDR: 20 | 30 days supply | Qty: 30 | Fill #2

## 2018-06-02 ENCOUNTER — Other Ambulatory Visit: Payer: Self-pay | Admitting: Medical

## 2018-06-03 ENCOUNTER — Ambulatory Visit (INDEPENDENT_AMBULATORY_CARE_PROVIDER_SITE_OTHER): Payer: 59 | Admitting: Physician Assistant

## 2018-06-03 MED ORDER — HYDROXYZINE HCL 25 MG PO TABS: ORAL_TABLET | ORAL | 0 refills | Status: DC

## 2018-06-03 NOTE — Telephone Encounter (Signed)
Rx hydroxyzine sent to pt pharmacy. °

## 2018-06-17 ENCOUNTER — Other Ambulatory Visit: Payer: Self-pay | Admitting: Medical

## 2018-06-17 MED ORDER — FLUOXETINE HCL 40 MG PO CAPS: 40.0000 mg | ORAL_CAPSULE | Freq: Every day | ORAL | 1 refills | Status: DC

## 2018-06-17 MED ORDER — BUPROPION HCL ER (SR) 150 MG PO TB12: 150.0000 mg | ORAL_TABLET | Freq: Two times a day (BID) | ORAL | 1 refills | Status: DC

## 2018-06-17 MED ORDER — HYDROXYZINE HCL 25 MG PO TABS: 25.0000 mg | ORAL_TABLET | Freq: Every evening | ORAL | 3 refills | Status: DC | PRN

## 2018-06-17 MED FILL — BUPROPION HCL ER (SR) 150 M: 150 | 90 days supply | Qty: 180 | Fill #0

## 2018-06-17 MED FILL — hydrOXYzine HCL 25 MG TABS: 25 | 30 days supply | Qty: 30 | Fill #0

## 2018-06-17 MED FILL — FLUoxetine HCL 40 MG CAPS: 40 | 90 days supply | Qty: 90 | Fill #0

## 2018-06-20 MED FILL — OMEPRAZOLE 20 MG CPDR: 20 | 30 days supply | Qty: 30 | Fill #3

## 2018-06-20 MED FILL — LISINOPRIL 20 MG TABLET: 20 | 90 days supply | Qty: 90 | Fill #2

## 2018-07-25 ENCOUNTER — Encounter: Payer: Self-pay | Admitting: Medical

## 2018-08-01 ENCOUNTER — Other Ambulatory Visit: Payer: Self-pay | Admitting: Medical

## 2018-08-01 MED FILL — hydrOXYzine HCL 25 MG TABS: 25 | 30 days supply | Qty: 30 | Fill #1

## 2018-08-01 MED FILL — KETOCONAZOLE 2% SHAMPOO: 2 | 90 days supply | Qty: 120 | Fill #1

## 2018-09-04 ENCOUNTER — Ambulatory Visit: Payer: 59 | Admitting: Family Medicine

## 2018-09-05 ENCOUNTER — Encounter: Payer: Self-pay | Admitting: Family Medicine

## 2018-09-05 ENCOUNTER — Ambulatory Visit: Payer: 59 | Admitting: Family Medicine

## 2018-09-22 NOTE — Progress Notes (Signed)
Subjective:    Patient ID: [name], female    DOB: 02-04-1961, 58 y.o.   MRN: [id number]  HPI  Virtual Visit via Video Note  I connected with [name] on 09/22/18 at  8:00 AM EDT by a video enabled telemedicine application and verified that I am speaking with the correct person using two identifiers.  Location: Patient: home  Provider: office  Pt has not checked her bp yet.   I discussed the limitations of evaluation and management by telemedicine and the availability of in person appointments. The patient expressed understanding and agreed to proceed.  History of Present Illness: Follow up for htn. Last visit her bp was borderline controlled but she had not taken lisinopril that day. No cardiac or neurologic signs or symptoms reported.  For hx of depression advised continue wellbutrin and seroquel. Pt states 2 months ago she stopped wellbutrin since it was not helping for ADD. She states on line for FNP. Pt [place] vyvanse 70 mg. Pt bp at beginning of her shift is 120/83.(formerly on vyvanse for about 4 years).   Pt states she needs refill of prozac 40 mg daily.  Also she mentioned some ADD in the past. I explained wellbutrin off label for ADD but sometimes pt report helps.  For reported insomnia I did make hydroxyzine available on last visit. Pt decided to stop prn seroquel for insomnia. Since hydroxyzine works Education officer, environmentaladequatlely.  For psoriasis behind ears I refilled her clobestol. For scalp rash I rx' nizoral shampoo. No worse controlled.  For gerd refilled his omeprazole.    Pt has htn and need refill of lisinopril.   Observations/Objective: General-no acute distress, pleasant, oriented. Lungs- on inspection lungs appear unlabored. Neck- no tracheal deviation or jvd on inspection. Neuro- gross motor function appears intact.  General-no acute distress, pleasant, oriented. Lungs- on inspection lungs appear unlabored. Neck-  no tracheal deviation or jvd on inspection. Neuro- gross motor function appears intact.    Assessment and Plan: For hx of depression stable with just prozac. Doing well presently.  Htn recently controlled but do want updated bp level today. Pt will my chart me bp reading. Refilled pt lisinopril  Hx of ADD in the past. Pt thinks wellbutrin was not helping so she stopped. Had been on vyvanse in the past. Pt will give uds, sign contract and will get her to fill out ADD questioneer. Try to send out old record who wrote her former vyvanse.  Plan to start vyvanse at lower dose 30 mg assess respnse on attention and bp as well.  For gerd, refilled her omeprazole.  Follow Up Instructions:    I discussed the assessment and treatment plan with the patient. The patient was provided an opportunity to ask questions and all were answered. The patient agreed with the plan and demonstrated an understanding of the instructions.   The patient was advised to call back or seek an in-person evaluation if the symptoms worsen or if the condition fails to improve as anticipated.  I provided 25 minutes of non-face-to-face time during this encounter.   Esperanza RichtersEdward Annalisa Colonna, PA-C    Review of Systems  Constitutional: Negative for chills, fatigue and fever.  Respiratory: Negative for cough, chest tightness and wheezing.   Cardiovascular: Negative for chest pain and palpitations.  Gastrointestinal: Negative for abdominal distention, abdominal pain, constipation, diarrhea and nausea.       Controlled gerd.  Musculoskeletal: Negative for back pain.  Skin: Positive  for rash.       See hpi.  Neurological: Negative for dizziness, tremors, speech difficulty, numbness and headaches.  Psychiatric/Behavioral: Positive for decreased concentration, dysphoric mood and sleep disturbance. Negative for agitation and behavioral problems. The patient is not nervous/anxious.        See hpi.    No past medical history on file.    Social History   Socioeconomic History  . Marital status: Divorced    Spouse name: Not on file  . Number of children: Not on file  . Years of education: Not on file  . Highest education level: Not on file  Occupational History  . Not on file  Social Needs  . Financial resource strain: Not on file  . Food insecurity    Worry: Not on file    Inability: Not on file  . Transportation needs    Medical: Not on file    Non-medical: Not on file  Tobacco Use  . Smoking status: Never Smoker  . Smokeless tobacco: Never Used  Substance and Sexual Activity  . Alcohol use: No  . Drug use: No  . Sexual activity: Not on file  Lifestyle  . Physical activity    Days per week: Not on file    Minutes per session: Not on file  . Stress: Not on file  Relationships  . Social Herbalist on phone: Not on file    Gets together: Not on file    Attends religious service: Not on file    Active member of club or organization: Not on file    Attends meetings of clubs or organizations: Not on file    Relationship status: Not on file  . Intimate partner violence    Fear of current or ex partner: Not on file    Emotionally abused: Not on file    Physically abused: Not on file    Forced sexual activity: Not on file  Other Topics Concern  . Not on file  Social History Narrative  . Not on file    No past surgical history on file.  No family history on file.  No Known Allergies  Current Outpatient Medications on File Prior to Visit  Medication Sig Dispense Refill  . buPROPion (WELLBUTRIN SR) 150 MG 12 hr tablet Take 1 tablet (150 mg total) by mouth 2 (two) times daily. 180 tablet 1  . clobetasol ointment (TEMOVATE) 9.37 % Apply 1 application topically 2 (two) times daily. 30 g 1  . FLUoxetine (PROZAC) 40 MG capsule Take 1 capsule (40 mg total) by mouth daily. 90 capsule 1  . hydrOXYzine (ATARAX/VISTARIL) 25 MG tablet Take 1 tablet (25 mg total) by mouth at bedtime as needed. 30  tablet 3  . ketoconazole (NIZORAL) 2 % shampoo Apply 1 application topically 2 (two) times a week. 120 mL 1  . lisinopril (PRINIVIL,ZESTRIL) 20 MG tablet Take 1 tablet (20 mg total) by mouth daily. 90 tablet 3  . omeprazole (PRILOSEC) 20 MG capsule Take 1 capsule (20 mg total) by mouth daily. 30 capsule 3  . QUEtiapine (SEROQUEL) 100 MG tablet Take 1 tablet (100 mg total) by mouth at bedtime. 90 tablet 1   No current facility-administered medications on file prior to visit.     Pulse 72   Ht 5\' 3"  (1.6 m)   Wt 223 lb (101.2 kg)   LMP 12/25/2012 (Approximate)   BMI 39.50 kg/m        Objective:   Physical  Exam        Assessment & Plan:

## 2018-09-24 ENCOUNTER — Ambulatory Visit (INDEPENDENT_AMBULATORY_CARE_PROVIDER_SITE_OTHER): Payer: 59 | Admitting: Medical

## 2018-09-24 ENCOUNTER — Encounter: Payer: Self-pay | Admitting: Medical

## 2018-09-24 ENCOUNTER — Other Ambulatory Visit: Payer: Self-pay

## 2018-09-24 VITALS — BP 118/73 | HR 72 | Ht 63.0 in | Wt 223.0 lb

## 2018-09-24 DIAGNOSIS — F988 Other specified behavioral and emotional disorders with onset usually occurring in childhood and adolescence: Secondary | ICD-10-CM

## 2018-09-24 DIAGNOSIS — L409 Psoriasis, unspecified: Secondary | ICD-10-CM

## 2018-09-24 DIAGNOSIS — F329 Major depressive disorder, single episode, unspecified: Secondary | ICD-10-CM | POA: Diagnosis not present

## 2018-09-24 DIAGNOSIS — I10 Essential (primary) hypertension: Secondary | ICD-10-CM | POA: Diagnosis not present

## 2018-09-24 DIAGNOSIS — F32A Depression, unspecified: Secondary | ICD-10-CM

## 2018-09-24 DIAGNOSIS — K219 Gastro-esophageal reflux disease without esophagitis: Secondary | ICD-10-CM

## 2018-09-24 MED ORDER — LISINOPRIL 20 MG PO TABS: 20.0000 mg | ORAL_TABLET | Freq: Every day | ORAL | 3 refills | Status: DC

## 2018-09-24 MED ORDER — FLUOXETINE HCL 40 MG PO CAPS: 40.0000 mg | ORAL_CAPSULE | Freq: Every day | ORAL | 1 refills | Status: DC

## 2018-09-24 MED ORDER — OMEPRAZOLE 20 MG PO CPDR: 20.0000 mg | DELAYED_RELEASE_CAPSULE | Freq: Every day | ORAL | 3 refills | Status: DC

## 2018-09-24 MED FILL — LISINOPRIL 20 MG TABLET: 20 | 90 days supply | Qty: 90 | Fill #0

## 2018-09-24 MED FILL — FLUoxetine HCL 40 MG CAPS: 40 | 90 days supply | Qty: 90 | Fill #0

## 2018-09-24 MED FILL — OMEPRAZOLE 20 MG CPDR: 20 | 30 days supply | Qty: 30 | Fill #0

## 2018-09-24 NOTE — Patient Instructions (Addendum)
For hx of depression stable with just prozac. Doing well presently.  Htn recently controlled but do want updated bp level today. Pt will my chart me bp reading. Refilled pt lisinopril  Hx of ADD in the past. Pt thinks wellbutrin was not helping so she stopped. Had been on vyvanse in the past. Pt will give uds, sign contract and will get her to fill out ADD questioneer. Try to send out old record who wrote her former vyvanse.  Plan to start vyvanse at lower dose 30 mg assess respnse on attention and bp as well.  For gerd, refilled her omeprazole.

## 2018-09-26 ENCOUNTER — Other Ambulatory Visit: Payer: 59

## 2018-09-26 ENCOUNTER — Other Ambulatory Visit: Payer: Self-pay

## 2018-09-26 ENCOUNTER — Telehealth: Payer: Self-pay | Admitting: Medical

## 2018-09-26 DIAGNOSIS — Z79899 Other long term (current) drug therapy: Secondary | ICD-10-CM

## 2018-09-26 NOTE — Telephone Encounter (Signed)
Pt coming in today for lab appointment.

## 2018-09-29 ENCOUNTER — Telehealth: Payer: Self-pay | Admitting: Medical

## 2018-09-29 ENCOUNTER — Encounter: Payer: Self-pay | Admitting: Medical

## 2018-09-29 LAB — PAIN MGMT, PROFILE 8 W/CONF, U
6 Acetylmorphine: NEGATIVE ng/mL
Alcohol Metabolites: POSITIVE ng/mL — AB (ref <500)
Amphetamines: NEGATIVE ng/mL
Benzodiazepines: NEGATIVE ng/mL
Buprenorphine, Urine: NEGATIVE ng/mL
Cocaine Metabolite: NEGATIVE ng/mL
Creatinine: 117.1 mg/dL
Ethyl Glucuronide (ETG): 1439 ng/mL
Ethyl Sulfate (ETS): 620 ng/mL
MDMA: NEGATIVE ng/mL
Marijuana Metabolite: NEGATIVE ng/mL
Opiates: NEGATIVE ng/mL
Oxidant: NEGATIVE ug/mL
Oxycodone: NEGATIVE ng/mL
pH: 5.5 (ref 4.5–9.0)

## 2018-09-29 MED ORDER — LISDEXAMFETAMINE DIMESYLATE 30 MG PO CAPS: 30.0000 mg | ORAL_CAPSULE | Freq: Every day | ORAL | 0 refills | Status: DC

## 2018-09-29 NOTE — Telephone Encounter (Signed)
Rx vyvanse sent to pt pharmacy. 

## 2018-09-30 MED FILL — VYVANSE 30 MG CAPSULE: 30 | 30 days supply | Qty: 30 | Fill #0

## 2018-09-30 MED FILL — hydrOXYzine HCL 25 MG TABS: 25 | 30 days supply | Qty: 30 | Fill #2

## 2018-10-20 ENCOUNTER — Encounter: Payer: Self-pay | Admitting: Medical

## 2018-10-22 ENCOUNTER — Ambulatory Visit: Payer: 59 | Admitting: Medical

## 2018-10-22 ENCOUNTER — Other Ambulatory Visit: Payer: Self-pay

## 2018-10-23 ENCOUNTER — Encounter: Payer: Self-pay | Admitting: Medical

## 2018-10-24 ENCOUNTER — Ambulatory Visit: Payer: 59 | Admitting: Medical

## 2018-11-04 ENCOUNTER — Ambulatory Visit (INDEPENDENT_AMBULATORY_CARE_PROVIDER_SITE_OTHER): Payer: 59 | Admitting: Medical

## 2018-11-04 ENCOUNTER — Other Ambulatory Visit: Payer: Self-pay

## 2018-11-04 ENCOUNTER — Encounter: Payer: Self-pay | Admitting: Medical

## 2018-11-04 VITALS — BP 154/84 | HR 87 | Temp 98.1°F | Resp 16 | Ht 63.0 in | Wt 227.0 lb

## 2018-11-04 DIAGNOSIS — R5383 Other fatigue: Secondary | ICD-10-CM

## 2018-11-04 DIAGNOSIS — I10 Essential (primary) hypertension: Secondary | ICD-10-CM | POA: Diagnosis not present

## 2018-11-04 MED ORDER — LISDEXAMFETAMINE DIMESYLATE 40 MG PO CAPS: ORAL_CAPSULE | ORAL | 0 refills | Status: DC

## 2018-11-04 MED ORDER — LISINOPRIL 40 MG PO TABS: 40.0000 mg | ORAL_TABLET | Freq: Every day | ORAL | 0 refills | Status: DC

## 2018-11-04 MED FILL — LISINOPRIL 40 MG TABLET: 40 | 30 days supply | Qty: 30 | Fill #0

## 2018-11-04 MED FILL — VYVANSE 40 MG CAPSULE: 40 | 30 days supply | Qty: 30 | Fill #0

## 2018-11-04 NOTE — Patient Instructions (Addendum)
For left knee osteoarthritis moderate to severe knee pain, patient is going to see Ortho  this Thursday.  Not giving any NSAIDs today due to concern for patient's blood pressure.  For hypertension usually controlled at home and at work but high today, going to increase her lisinopril since also increasing her Vyvanse.   For ADD, patient has reported that current dose has not been adequate enough to help her concentration.  Formally was on 70 mg but I think that is too high of a dose in light of her hypertension.  Hopefully she will get improvement with increase to 40 mg.  Future metabolic panel and lipid panel placed today.  Asked patient to get scheduled on the way out for either tomorrow or Wednesday.  Follow-up date to be determined after lab review.  Maybe 2 week bp follow up by virtual telephone or my chart reading review.

## 2018-11-04 NOTE — Progress Notes (Signed)
   Subjective:    Patient ID: [name], female    DOB: April 29, 1960, 58 y.o.   MRN: [id number]  HPI  Pt in for follow up.  Pt is going to ortho for left knee bone on bone OA. Pt will see ortho on Thursday.  Pt bp is high today. She just worked 4 12 hour shift. Also has severe knee pain.  Pt has htn. At work and home her bp 427-062 systolic and 37-62 diastolic.  Pt is on ADD meds. She states has been on vyvanse 30 mg. In past was on 70 mg. Up to date on UDS.     Review of Systems  Constitutional: Positive for fatigue. Negative for chills and fever.       Probably associated with work.  Respiratory: Negative for cough, chest tightness, wheezing and stridor.   Cardiovascular: Negative for chest pain and palpitations.  Gastrointestinal: Negative for abdominal pain.  Genitourinary: Negative for dysuria and flank pain.  Musculoskeletal: Negative for back pain, joint swelling and neck stiffness.       Left knee pain  Skin: Negative for rash.  Neurological: Negative for dizziness, syncope, weakness and headaches.  Hematological: Negative for adenopathy. Does not bruise/bleed easily.  Psychiatric/Behavioral: Positive for decreased concentration. Negative for agitation, confusion, hallucinations, sleep disturbance and suicidal ideas. The patient is not nervous/anxious.        Objective:   Physical Exam  General Mental Status- Alert. General Appearance- Not in acute distress.   Skin General: Color- Normal Color. Moisture- Normal Moisture.  Neck Carotid Arteries- Normal color. Moisture- Normal Moisture. No carotid bruits. No JVD.  Chest and Lung Exam Auscultation: Breath Sounds:-Normal.  Cardiovascular Auscultation:Rythm- Regular. Murmurs & Other Heart Sounds:Auscultation of the heart reveals- No Murmurs.  Abdomen Inspection:-Inspeection Normal. Palpation/Percussion:Note:No mass. Palpation and Percussion of the abdomen reveal- Non Tender, Non Distended + BS, no rebound or  guarding.   Neurologic Cranial Nerve exam:- CN III-XII intact(No nystagmus), symmetric smile. Strength:- 5/5 equal and symmetric strength both upper and lower extremities.  Left knee- moderate to severe crepitus. Tender to palpation over tibial plateau.     Assessment & Plan:  For left knee osteoarthritis moderate to severe knee pain, patient is going to see Ortho  this Thursday.  Not giving any NSAIDs today due to concern for patient's blood pressure.  For hypertension usually controlled at home and at work but high today, going to increase her lisinopril since also increasing her Vyvanse.   For ADD, patient has reported that current dose has not been adequate enough to help her concentration.  Formally was on 70 mg but I think that is too high of a dose in light of her hypertension.  Hopefully she will get improvement with increase to 40 mg.  Future metabolic panel and lipid panel placed today.  Asked patient to get scheduled on the way out for either tomorrow or Wednesday.  Follow-up date to be determined after lab review.  Maybe 2 week bp follow up by virtual telephone or my chart reading review.

## 2018-11-06 ENCOUNTER — Other Ambulatory Visit: Payer: 59

## 2018-11-06 MED FILL — hydrOXYzine HCL 25 MG TABS: 25 | 30 days supply | Qty: 30 | Fill #3

## 2018-11-07 DIAGNOSIS — M170 Bilateral primary osteoarthritis of knee: Secondary | ICD-10-CM | POA: Diagnosis not present

## 2018-11-07 DIAGNOSIS — M1712 Unilateral primary osteoarthritis, left knee: Secondary | ICD-10-CM | POA: Diagnosis not present

## 2018-11-07 DIAGNOSIS — M25562 Pain in left knee: Secondary | ICD-10-CM | POA: Diagnosis not present

## 2018-11-10 ENCOUNTER — Encounter: Payer: Self-pay | Admitting: Medical

## 2018-11-11 ENCOUNTER — Other Ambulatory Visit: Payer: 59

## 2018-11-14 ENCOUNTER — Encounter: Payer: Self-pay | Admitting: Medical

## 2018-11-14 ENCOUNTER — Telehealth: Payer: Self-pay | Admitting: Medical

## 2018-11-14 ENCOUNTER — Telehealth: Payer: Self-pay | Admitting: *Deleted

## 2018-11-14 ENCOUNTER — Other Ambulatory Visit (INDEPENDENT_AMBULATORY_CARE_PROVIDER_SITE_OTHER): Payer: 59

## 2018-11-14 ENCOUNTER — Other Ambulatory Visit: Payer: Self-pay

## 2018-11-14 DIAGNOSIS — I10 Essential (primary) hypertension: Secondary | ICD-10-CM | POA: Diagnosis not present

## 2018-11-14 DIAGNOSIS — R5383 Other fatigue: Secondary | ICD-10-CM

## 2018-11-14 LAB — COMPREHENSIVE METABOLIC PANEL
ALT: 22 U/L (ref 0–35)
AST: 23 U/L (ref 0–37)
Albumin: 4.6 g/dL (ref 3.5–5.2)
Alkaline Phosphatase: 98 U/L (ref 39–117)
BUN: 22 mg/dL (ref 6–23)
CO2: 27 mEq/L (ref 19–32)
Calcium: 9.4 mg/dL (ref 8.4–10.5)
Chloride: 102 mEq/L (ref 96–112)
Creatinine, Ser: 0.76 mg/dL (ref 0.40–1.20)
GFR: 78.7 mL/min (ref >60.00)
Glucose, Bld: 100 mg/dL — ABNORMAL HIGH (ref 70–99)
Potassium: 3.9 mEq/L (ref 3.5–5.1)
Sodium: 140 mEq/L (ref 135–145)
Total Bilirubin: 0.4 mg/dL (ref 0.2–1.2)
Total Protein: 6.8 g/dL (ref 6.0–8.3)

## 2018-11-14 LAB — T4, FREE: Free T4: 0.98 ng/dL (ref 0.60–1.60)

## 2018-11-14 LAB — TSH: TSH: 1.17 u[IU]/mL (ref 0.35–4.50)

## 2018-11-14 MED FILL — OMEPRAZOLE 20 MG CAP: 20 | 30 days supply | Qty: 30 | Fill #1

## 2018-11-14 NOTE — Telephone Encounter (Signed)
Pt came in for labs. States she has been having fatigue and would like a tsh level checked. Is this ok to add?  Also, she declined lipid panel today as she was not fasting and stated she would do lipids at another time.

## 2018-11-14 NOTE — Telephone Encounter (Signed)
For fatigue can get tsh and t4. You could also add cbc, cmp and b12. If you have right tubes?

## 2018-11-14 NOTE — Telephone Encounter (Signed)
I added tsh and free T4 to today's lab. I not able to add CBC since I did not draw a lavender tube.

## 2018-11-14 NOTE — Addendum Note (Signed)
Addended by: Kelle Darting A on: 11/14/2018 10:47 AM   Modules accepted: Orders

## 2018-11-14 NOTE — Telephone Encounter (Signed)
Opened to review 

## 2018-11-14 NOTE — Addendum Note (Signed)
Addended by: Kelle Darting A on: 11/14/2018 01:26 PM   Modules accepted: Orders

## 2018-12-01 ENCOUNTER — Other Ambulatory Visit: Payer: Self-pay | Admitting: Medical

## 2018-12-01 DIAGNOSIS — R5383 Other fatigue: Secondary | ICD-10-CM

## 2018-12-03 MED ORDER — LISDEXAMFETAMINE DIMESYLATE 40 MG PO CAPS: ORAL_CAPSULE | ORAL | 0 refills | Status: DC

## 2018-12-03 MED FILL — VYVANSE 40 MG CAPSULE: 40 | 30 days supply | Qty: 30 | Fill #0

## 2018-12-03 NOTE — Telephone Encounter (Signed)
Up to date on contract and uds.  Controlled med site review today.  Refill vyvanse today.

## 2018-12-09 MED FILL — CLOBETASOL PROP 0.05% OINT: 0.05 | 15 days supply | Qty: 30 | Fill #1

## 2018-12-28 ENCOUNTER — Other Ambulatory Visit: Payer: Self-pay | Admitting: Medical

## 2018-12-28 DIAGNOSIS — R5383 Other fatigue: Secondary | ICD-10-CM

## 2018-12-30 ENCOUNTER — Encounter: Payer: Self-pay | Admitting: Medical

## 2018-12-30 ENCOUNTER — Other Ambulatory Visit: Payer: Self-pay | Admitting: *Deleted

## 2018-12-30 MED ORDER — LISINOPRIL 40 MG PO TABS: 40.0000 mg | ORAL_TABLET | Freq: Every day | ORAL | 1 refills | Status: DC

## 2018-12-30 MED ORDER — LISDEXAMFETAMINE DIMESYLATE 40 MG PO CAPS: ORAL_CAPSULE | ORAL | 0 refills | Status: DC

## 2018-12-30 MED FILL — LISINOPRIL 40 MG TABLET: 40 | 90 days supply | Qty: 90 | Fill #0

## 2018-12-30 NOTE — Telephone Encounter (Signed)
Rx vyvanse sent to pt pharmacy.   Up to date on contract, uds and  Reviewed controlled med site today.

## 2019-01-08 MED FILL — FLUoxetine HCL 40 MG CAPS: 40 | 90 days supply | Qty: 90 | Fill #1

## 2019-01-08 MED FILL — hydrOXYzine HCL 25 MG TABS: 25 | 30 days supply | Qty: 30 | Fill #0

## 2019-01-08 MED FILL — LISINOPRIL 40 MG TABLET: 40 | 90 days supply | Qty: 90 | Fill #0

## 2019-01-08 MED FILL — VYVANSE 40 MG CAPSULE: 40 | 30 days supply | Qty: 30 | Fill #0

## 2019-01-08 MED FILL — OMEPRAZOLE 20 MG CAP: 20 | 30 days supply | Qty: 30 | Fill #2

## 2019-02-04 ENCOUNTER — Other Ambulatory Visit: Payer: Self-pay | Admitting: Medical

## 2019-02-04 DIAGNOSIS — R5383 Other fatigue: Secondary | ICD-10-CM

## 2019-02-04 MED ORDER — LISDEXAMFETAMINE DIMESYLATE 40 MG PO CAPS: ORAL_CAPSULE | ORAL | 0 refills | Status: DC

## 2019-02-04 MED FILL — VYVANSE 40 MG CAPSULE: 40 | 30 days supply | Qty: 30 | Fill #0

## 2019-02-04 NOTE — Telephone Encounter (Signed)
Requesting:Vyvanse Contract:10/09/2018 UDS:09/26/2018 Last Visit:11/04/2018 Next Visit:none Last Refill:12/30/2018  Please Advise

## 2019-02-04 NOTE — Telephone Encounter (Signed)
Rx vyvanse sent to pharmacy. 

## 2019-02-24 ENCOUNTER — Encounter: Payer: Self-pay | Admitting: Medical

## 2019-02-24 MED ORDER — OMEPRAZOLE 20 MG PO CPDR: 20.0000 mg | DELAYED_RELEASE_CAPSULE | Freq: Every day | ORAL | 3 refills | Status: DC

## 2019-02-24 MED FILL — OMEPRAZOLE 20 MG CAP: 20 | 30 days supply | Qty: 30 | Fill #0

## 2019-03-04 ENCOUNTER — Other Ambulatory Visit: Payer: Self-pay | Admitting: Medical

## 2019-03-04 DIAGNOSIS — R5383 Other fatigue: Secondary | ICD-10-CM

## 2019-03-04 NOTE — Telephone Encounter (Addendum)
Requesting:vyvanse  Contract:yes UDS: low risk  Last OV:11/04/18 Next OV:n/a Last Refill:02/04/19  #30-0rf Database:03/05/2019 reviewed   Please advise   I refilled her rx. vyvanse

## 2019-03-05 ENCOUNTER — Telehealth: Payer: Self-pay | Admitting: Medical

## 2019-03-05 MED ORDER — LISDEXAMFETAMINE DIMESYLATE 40 MG PO CAPS: ORAL_CAPSULE | ORAL | 0 refills | Status: DC

## 2019-03-05 MED FILL — VYVANSE 40 MG CAPSULE: 40 | 30 days supply | Qty: 30 | Fill #0

## 2019-03-05 NOTE — Telephone Encounter (Signed)
Opened to review controlled med site review.

## 2019-03-22 ENCOUNTER — Encounter: Payer: Self-pay | Admitting: Medical

## 2019-04-02 ENCOUNTER — Ambulatory Visit: Payer: 59 | Admitting: Medical

## 2019-04-02 ENCOUNTER — Encounter: Payer: Self-pay | Admitting: Medical

## 2019-04-10 ENCOUNTER — Other Ambulatory Visit: Payer: Self-pay | Admitting: Medical

## 2019-04-10 MED FILL — LISINOPRIL 40 MG TABLET: 40 | 90 days supply | Qty: 90 | Fill #1

## 2019-04-10 MED FILL — hydrOXYzine HCL 25 MG TABS: 25 | 30 days supply | Qty: 30 | Fill #0

## 2019-04-10 MED FILL — OMEPRAZOLE 20 MG CAP: 20 | 30 days supply | Qty: 30 | Fill #1

## 2019-04-11 ENCOUNTER — Other Ambulatory Visit: Payer: Self-pay

## 2019-04-14 ENCOUNTER — Other Ambulatory Visit: Payer: Self-pay

## 2019-04-14 ENCOUNTER — Ambulatory Visit: Payer: 59 | Admitting: Medical

## 2019-04-14 ENCOUNTER — Encounter: Payer: Self-pay | Admitting: Medical

## 2019-04-14 VITALS — BP 149/94 | HR 99 | Temp 97.0°F | Resp 18 | Ht 64.0 in | Wt 231.2 lb

## 2019-04-14 DIAGNOSIS — F988 Other specified behavioral and emotional disorders with onset usually occurring in childhood and adolescence: Secondary | ICD-10-CM

## 2019-04-14 DIAGNOSIS — F329 Major depressive disorder, single episode, unspecified: Secondary | ICD-10-CM | POA: Diagnosis not present

## 2019-04-14 DIAGNOSIS — R739 Hyperglycemia, unspecified: Secondary | ICD-10-CM

## 2019-04-14 DIAGNOSIS — I10 Essential (primary) hypertension: Secondary | ICD-10-CM

## 2019-04-14 DIAGNOSIS — F32A Depression, unspecified: Secondary | ICD-10-CM

## 2019-04-14 MED ORDER — LISDEXAMFETAMINE DIMESYLATE 50 MG PO CAPS: 50.0000 mg | ORAL_CAPSULE | Freq: Every day | ORAL | 0 refills | Status: DC

## 2019-04-14 MED ORDER — AMLODIPINE BESYLATE 5 MG PO TABS: 5.0000 mg | ORAL_TABLET | Freq: Every day | ORAL | 0 refills | Status: DC

## 2019-04-14 MED ORDER — FLUOXETINE HCL 20 MG PO TABS: 20.0000 mg | ORAL_TABLET | Freq: Every day | ORAL | 3 refills | Status: DC

## 2019-04-14 MED FILL — VYVANSE 50 MG CAPSULE: 50 | 30 days supply | Qty: 30 | Fill #0

## 2019-04-14 MED FILL — FLUOXETINE HCL 20 MG TABS: 20 | 30 days supply | Qty: 30 | Fill #0

## 2019-04-14 MED FILL — AMLODIPINE BESYLATE 5 MG TA: 5 | 30 days supply | Qty: 30 | Fill #0

## 2019-04-14 NOTE — Progress Notes (Signed)
Subjective:    Patient ID: [name], female    DOB: 09-29-1960, 59 y.o.   MRN: [id number]  HPI  Pt in for follow up.  She has ADD. Missed her last appointment due to flu like illness.   Pt is on vyvanse 40 mg daily. In the past she had wanted me to increase dose to 50 mg. Pt up to date on contract and uds.  Pt has htn history. Pt is on lisinopril 40 mg daily. Pt bp usually closer to 732 systolic usually. Does not give me diastolic reading estimate. No gross motor or sensory function deficits reported. No cardiac or neurologic signs or symptoms.    Review of Systems  Constitutional: Negative for chills, fatigue and fever.  Respiratory: Negative for cough, chest tightness, shortness of breath and wheezing.   Cardiovascular: Negative for chest pain and palpitations.  Skin: Negative for rash.  Neurological: Negative for dizziness, seizures, weakness and headaches.  Hematological: Negative for adenopathy. Does not bruise/bleed easily.  Psychiatric/Behavioral: Positive for decreased concentration and dysphoric mood. Negative for behavioral problems, confusion, sleep disturbance and suicidal ideas. The patient is not nervous/anxious.     No past medical history on file.   Social History   Socioeconomic History  . Marital status: Divorced    Spouse name: Not on file  . Number of children: Not on file  . Years of education: Not on file  . Highest education level: Not on file  Occupational History  . Not on file  Tobacco Use  . Smoking status: Never Smoker  . Smokeless tobacco: Never Used  Substance and Sexual Activity  . Alcohol use: No  . Drug use: No  . Sexual activity: Not on file  Other Topics Concern  . Not on file  Social History Narrative  . Not on file   Social Determinants of Health   Financial Resource Strain:   . Difficulty of Paying Living Expenses: Not on file  Food Insecurity:   . Worried About Charity fundraiser in the Last Year: Not on file  . Ran  Out of Food in the Last Year: Not on file  Transportation Needs:   . Lack of Transportation (Medical): Not on file  . Lack of Transportation (Non-Medical): Not on file  Physical Activity:   . Days of Exercise per Week: Not on file  . Minutes of Exercise per Session: Not on file  Stress:   . Feeling of Stress : Not on file  Social Connections:   . Frequency of Communication with Friends and Family: Not on file  . Frequency of Social Gatherings with Friends and Family: Not on file  . Attends Religious Services: Not on file  . Active Member of Clubs or Organizations: Not on file  . Attends Archivist Meetings: Not on file  . Marital Status: Not on file  Intimate Partner Violence:   . Fear of Current or Ex-Partner: Not on file  . Emotionally Abused: Not on file  . Physically Abused: Not on file  . Sexually Abused: Not on file    No past surgical history on file.  No family history on file.  No Known Allergies  Current Outpatient Medications on File Prior to Visit  Medication Sig Dispense Refill  . clobetasol ointment (TEMOVATE) 2.02 % Apply 1 application topically 2 (two) times daily. 30 g 1  . hydrOXYzine (ATARAX/VISTARIL) 25 MG tablet TAKE 1 TABLET BY MOUTH AT BEDTIME AS NEEDED FOR INSOMNIA 30 tablet 0  .  ketoconazole (NIZORAL) 2 % shampoo Apply 1 application topically 2 (two) times a week. 120 mL 1  . lisinopril (ZESTRIL) 40 MG tablet Take 1 tablet (40 mg total) by mouth daily. 90 tablet 1  . omeprazole (PRILOSEC) 20 MG capsule Take 1 capsule (20 mg total) by mouth daily. 30 capsule 3   No current facility-administered medications on file prior to visit.    BP (!) 149/94 (BP Location: Right Arm, Patient Position: Sitting, Cuff Size: Large)   Pulse 99   Temp (!) 97 F (36.1 C) (Temporal)   Resp 18   Ht 5\' 4"  (1.626 m)   Wt 231 lb 3.2 oz (104.9 kg)   LMP 12/25/2012 (Approximate)   SpO2 96%   BMI 39.69 kg/m       Objective:   Physical  Exam  General Mental Status- Alert. General Appearance- Not in acute distress.   Skin General: Color- Normal Color. Moisture- Normal Moisture.  Neck Carotid Arteries- Normal color. Moisture- Normal Moisture. No carotid bruits. No JVD.  Chest and Lung Exam Auscultation: Breath Sounds:-Normal.  Cardiovascular Auscultation:Rythm- Regular. Murmurs & Other Heart Sounds:Auscultation of the heart reveals- No Murmurs.  Abdomen Inspection:-Inspeection Normal. Palpation/Percussion:Note:No mass. Palpation and Percussion of the abdomen reveal- Non Tender, Non Distended + BS, no rebound or guarding.  Neurologic Cranial Nerve exam:- CN III-XII intact(No nystagmus), symmetric smile. Strength:- 5/5 equal and symmetric strength both upper and lower extremities.      Assessment & Plan:  For htn, I am adding amlodipine to your current regimen of lisinopril.  For add, I am increasing your vyvanse to 50 mg daily.   Keep checking bp. If bp not coming down to less than 140/90 will need to increase amlodipine to 10 mg daily. Update me in one week.  For depression, I am refilling your prozac.   Follow up by my chart in one.(need bp readings to review)   30 minutes spent with pt. 50% of time spent counseling pt on plan going forward.   02/24/2013, PA-C

## 2019-04-14 NOTE — Patient Instructions (Addendum)
For htn, I am adding amlodipine to your current regimen of lisinopril.  For add, I am increasing your vyvanse to 50 mg daily.   Keep checking bp. If bp not coming down to less than 140/90 will need to increase amlodipine to 10 mg daily. Update me in one week.  For depression, I am refilling your prozac.   Future labs placed cmp, lipid panel and a1c.  Follow up by my chart in one.(need bp readings to review)

## 2019-04-16 ENCOUNTER — Telehealth: Payer: Self-pay | Admitting: *Deleted

## 2019-04-16 DIAGNOSIS — M25562 Pain in left knee: Secondary | ICD-10-CM | POA: Diagnosis not present

## 2019-04-16 NOTE — Telephone Encounter (Signed)
Called pt to screen for lab visit tomorrow and mailbox was full. Sent [employer].

## 2019-04-17 ENCOUNTER — Other Ambulatory Visit: Payer: Self-pay

## 2019-04-17 ENCOUNTER — Other Ambulatory Visit (INDEPENDENT_AMBULATORY_CARE_PROVIDER_SITE_OTHER): Payer: 59

## 2019-04-17 DIAGNOSIS — I10 Essential (primary) hypertension: Secondary | ICD-10-CM | POA: Diagnosis not present

## 2019-04-17 DIAGNOSIS — R739 Hyperglycemia, unspecified: Secondary | ICD-10-CM

## 2019-04-17 LAB — COMPREHENSIVE METABOLIC PANEL
ALT: 27 U/L (ref 0–35)
AST: 25 U/L (ref 0–37)
Albumin: 4.1 g/dL (ref 3.5–5.2)
Alkaline Phosphatase: 95 U/L (ref 39–117)
BUN: 14 mg/dL (ref 6–23)
CO2: 31 mEq/L (ref 19–32)
Calcium: 9.3 mg/dL (ref 8.4–10.5)
Chloride: 103 mEq/L (ref 96–112)
Creatinine, Ser: 0.64 mg/dL (ref 0.40–1.20)
GFR: 95.82 mL/min (ref >60.00)
Glucose, Bld: 92 mg/dL (ref 70–99)
Potassium: 4 mEq/L (ref 3.5–5.1)
Sodium: 140 mEq/L (ref 135–145)
Total Bilirubin: 0.4 mg/dL (ref 0.2–1.2)
Total Protein: 6.4 g/dL (ref 6.0–8.3)

## 2019-04-17 LAB — LIPID PANEL
Cholesterol: 227 mg/dL — ABNORMAL HIGH (ref 0–200)
HDL: 74 mg/dL (ref >39.00)
LDL Cholesterol: 133 mg/dL — ABNORMAL HIGH (ref 0–99)
NonHDL: 152.82
Total CHOL/HDL Ratio: 3
Triglycerides: 99 mg/dL (ref 0.0–149.0)
VLDL: 19.8 mg/dL (ref 0.0–40.0)

## 2019-04-17 LAB — HEMOGLOBIN A1C: Hgb A1c MFr Bld: 5.5 % (ref 4.6–6.5)

## 2019-04-17 NOTE — Addendum Note (Signed)
Addended by: Mervin Kung A on: 04/17/2019 07:37 AM   Modules accepted: Orders

## 2019-04-28 ENCOUNTER — Encounter: Payer: Self-pay | Admitting: Medical

## 2019-05-03 ENCOUNTER — Encounter: Payer: Self-pay | Admitting: Medical

## 2019-05-06 ENCOUNTER — Telehealth: Payer: Self-pay | Admitting: Medical

## 2019-05-06 MED ORDER — AMLODIPINE BESYLATE 10 MG PO TABS: 10.0000 mg | ORAL_TABLET | Freq: Every day | ORAL | 1 refills | Status: DC

## 2019-05-06 MED FILL — AMLODIPINE BESYLATE 10 MG T: 10 | 90 days supply | Qty: 90 | Fill #0

## 2019-05-06 NOTE — Telephone Encounter (Signed)
Rx higher dose amlodipine sent to pharmacy.

## 2019-05-08 MED FILL — OMEPRAZOLE 20 MG CAP: 20 | 30 days supply | Qty: 30 | Fill #2

## 2019-05-12 MED FILL — FLUOXETINE HCL 20 MG TABS: 20 | 30 days supply | Qty: 30 | Fill #1

## 2019-05-14 ENCOUNTER — Encounter: Payer: Self-pay | Admitting: Medical

## 2019-05-14 ENCOUNTER — Other Ambulatory Visit: Payer: Self-pay | Admitting: Medical

## 2019-05-14 MED ORDER — LISDEXAMFETAMINE DIMESYLATE 50 MG PO CAPS: 50.0000 mg | ORAL_CAPSULE | Freq: Every day | ORAL | 0 refills | Status: DC

## 2019-05-14 MED FILL — VYVANSE 50 MG CAPSULE: 50 | 30 days supply | Qty: 30 | Fill #0

## 2019-05-14 NOTE — Telephone Encounter (Signed)
Rx refill vyvanse sent.

## 2019-05-14 NOTE — Telephone Encounter (Signed)
Requesting: Vyvanse  Contract: none UDS:09/26/2018 Last Visit:04/14/19 Next Visit:05/21/19 Last Refill:04/14/19  Please Advise

## 2019-05-14 NOTE — Telephone Encounter (Signed)
Requesting: Vyvanse Contract: None UDS: UTD Last OV: 1.18.2021 Next OV: 2.19.2021 Last Refill: 1.18.2021   Please advise

## 2019-05-15 ENCOUNTER — Ambulatory Visit: Payer: 59 | Admitting: Medical

## 2019-05-16 ENCOUNTER — Emergency Department (HOSPITAL_BASED_OUTPATIENT_CLINIC_OR_DEPARTMENT_OTHER): Payer: 59

## 2019-05-16 ENCOUNTER — Encounter (HOSPITAL_BASED_OUTPATIENT_CLINIC_OR_DEPARTMENT_OTHER): Payer: Self-pay | Admitting: Emergency Medicine

## 2019-05-16 ENCOUNTER — Ambulatory Visit: Payer: 59 | Admitting: Medical

## 2019-05-16 ENCOUNTER — Emergency Department (HOSPITAL_BASED_OUTPATIENT_CLINIC_OR_DEPARTMENT_OTHER)
Admission: EM | Admit: 2019-05-16 | Discharge: 2019-05-16 | Disposition: A | Discharge status: Home / Self Care | Payer: 59 | Attending: Emergency Medicine | Admitting: Emergency Medicine

## 2019-05-16 ENCOUNTER — Other Ambulatory Visit: Payer: Self-pay

## 2019-05-16 DIAGNOSIS — S8002XA Contusion of left knee, initial encounter: Secondary | ICD-10-CM | POA: Insufficient documentation

## 2019-05-16 DIAGNOSIS — Y999 Unspecified external cause status: Secondary | ICD-10-CM | POA: Diagnosis not present

## 2019-05-16 DIAGNOSIS — R220 Localized swelling, mass and lump, head: Secondary | ICD-10-CM | POA: Diagnosis not present

## 2019-05-16 DIAGNOSIS — S0121XA Laceration without foreign body of nose, initial encounter: Secondary | ICD-10-CM | POA: Insufficient documentation

## 2019-05-16 DIAGNOSIS — S0191XA Laceration without foreign body of unspecified part of head, initial encounter: Secondary | ICD-10-CM | POA: Diagnosis not present

## 2019-05-16 DIAGNOSIS — Y929 Unspecified place or not applicable: Secondary | ICD-10-CM | POA: Insufficient documentation

## 2019-05-16 DIAGNOSIS — W010XXA Fall on same level from slipping, tripping and stumbling without subsequent striking against object, initial encounter: Secondary | ICD-10-CM | POA: Diagnosis not present

## 2019-05-16 DIAGNOSIS — Y93K1 Activity, walking an animal: Secondary | ICD-10-CM | POA: Insufficient documentation

## 2019-05-16 DIAGNOSIS — I10 Essential (primary) hypertension: Secondary | ICD-10-CM | POA: Diagnosis not present

## 2019-05-16 DIAGNOSIS — S0181XA Laceration without foreign body of other part of head, initial encounter: Secondary | ICD-10-CM | POA: Diagnosis present

## 2019-05-16 DIAGNOSIS — Z79899 Other long term (current) drug therapy: Secondary | ICD-10-CM | POA: Diagnosis not present

## 2019-05-16 DIAGNOSIS — S0083XA Contusion of other part of head, initial encounter: Secondary | ICD-10-CM | POA: Diagnosis not present

## 2019-05-16 DIAGNOSIS — S0993XA Unspecified injury of face, initial encounter: Secondary | ICD-10-CM | POA: Diagnosis not present

## 2019-05-16 DIAGNOSIS — W19XXXA Unspecified fall, initial encounter: Secondary | ICD-10-CM

## 2019-05-16 HISTORY — DX: Other specified behavioral and emotional disorders with onset usually occurring in childhood and adolescence: F98.8

## 2019-05-16 HISTORY — DX: Unspecified osteoarthritis, unspecified site: M19.90

## 2019-05-16 HISTORY — DX: Gastro-esophageal reflux disease without esophagitis: K21.9

## 2019-05-16 HISTORY — DX: Essential (primary) hypertension: I10

## 2019-05-16 NOTE — ED Provider Notes (Signed)
[facility] EMERGENCY DEPARTMENT Provider Note   CSN: [id number] Arrival date & time: 05/16/19  1854     History Chief Complaint  Patient presents with  . Fall    [name] is a 59 y.o. female.  Pt presents to the ED today with a laceration to her forehead and nose.  Pt said she was walking her dog and tripped and fell.  She did a face plant and was dragged.  She did not have a loc.  She also hit her left knee, but is able to walk and bend her knee.          Past Medical History:  Diagnosis Date  . ADD (attention deficit disorder)   . Arthritis   . GERD (gastroesophageal reflux disease)   . Hypertension     Patient Active Problem List   Diagnosis Date Noted  . HTN (hypertension) 12/25/2017    Past Surgical History:  Procedure Laterality Date  . CESAREAN SECTION    . TONSILLECTOMY       OB History   No obstetric history on file.     No family history on file.  Social History   Tobacco Use  . Smoking status: Never Smoker  . Smokeless tobacco: Never Used  Substance Use Topics  . Alcohol use: No  . Drug use: No    Home Medications Prior to Admission medications   Medication Sig Start Date End Date Taking? Authorizing Provider  amLODipine (NORVASC) 10 MG tablet Take 1 tablet (10 mg total) by mouth daily. 05/06/19   Saguier, [name], PA-C  clobetasol ointment (TEMOVATE) 0.05 % Apply 1 application topically 2 (two) times daily. 03/25/18   Saguier, [name], PA-C  FLUoxetine (PROZAC) 20 MG tablet Take 1 tablet (20 mg total) by mouth daily. 04/14/19   Saguier, [name], PA-C  hydrOXYzine (ATARAX/VISTARIL) 25 MG tablet TAKE 1 TABLET BY MOUTH AT BEDTIME AS NEEDED FOR INSOMNIA 04/10/19   Saguier, [name], PA-C  ketoconazole (NIZORAL) 2 % shampoo Apply 1 application topically 2 (two) times a week. 03/25/18   Saguier, [name], PA-C  lisdexamfetamine (VYVANSE) 50 MG capsule Take 1 capsule (50 mg total) by mouth daily. 05/14/19   Saguier, [name], PA-C  lisinopril  (ZESTRIL) 40 MG tablet Take 1 tablet (40 mg total) by mouth daily. 12/30/18   Saguier, [name], PA-C  omeprazole (PRILOSEC) 20 MG capsule Take 1 capsule (20 mg total) by mouth daily. 02/24/19   Saguier, [name], PA-C    Allergies    Patient has no known allergies.  Review of Systems   Review of Systems  HENT: Positive for facial swelling.        Forehead lac, bridge of nose lac  All other systems reviewed and are negative.   Physical Exam Updated Vital Signs BP 129/77   Pulse 69   Temp 97.8 F (36.6 C) (Oral)   Resp 16   Ht 5\' 4"  (1.626 m)   Wt 102.1 kg   LMP 12/25/2012 (Approximate)   SpO2 99%   BMI 38.62 kg/m   Physical Exam Vitals and nursing note reviewed.  Constitutional:      Appearance: Normal appearance.  HENT:     Head:     Comments: Skin on bridge of nose gone.  No cartilage showing.  Lac to left forehead.  See picture.    Right Ear: External ear normal.     Left Ear: External ear normal.     Mouth/Throat:     Mouth: Mucous membranes are moist.  Pharynx: Oropharynx is clear.  Eyes:     Extraocular Movements: Extraocular movements intact.     Conjunctiva/sclera: Conjunctivae normal.     Pupils: Pupils are equal, round, and reactive to light.  Cardiovascular:     Rate and Rhythm: Normal rate and regular rhythm.     Pulses: Normal pulses.     Heart sounds: Normal heart sounds.  Pulmonary:     Effort: Pulmonary effort is normal.     Breath sounds: Normal breath sounds.  Abdominal:     General: Abdomen is flat. Bowel sounds are normal.     Palpations: Abdomen is soft.  Musculoskeletal:     Cervical back: Normal range of motion and neck supple.       Legs:     Comments: Full rom to left knee, but extensive bruising  Skin:    General: Skin is warm.     Capillary Refill: Capillary refill takes less than 2 seconds.  Neurological:     General: No focal deficit present.     Mental Status: She is alert and oriented to person, place, and time.    Psychiatric:        Mood and Affect: Mood normal.        Behavior: Behavior normal.        Thought Content: Thought content normal.        Judgment: Judgment normal.       ED Results / Procedures / Treatments   Labs (all labs ordered are listed, but only abnormal results are displayed) Labs Reviewed - No data to display  EKG None  Radiology CT Head Wo Contrast  Result Date: 05/16/2019 CLINICAL DATA:  59 year old female status post blunt trauma to the face, fall while walking dog. Lacerations. EXAM: CT HEAD WITHOUT CONTRAST CT MAXILLOFACIAL WITHOUT CONTRAST TECHNIQUE: Multidetector CT imaging of the head and maxillofacial structures were performed using the standard protocol without intravenous contrast. Multiplanar CT image reconstructions of the maxillofacial structures were also generated. COMPARISON:  None. FINDINGS: CT HEAD FINDINGS Brain: No midline shift, ventriculomegaly, mass effect, evidence of mass lesion, intracranial hemorrhage or evidence of cortically based acute infarction. Gray-white matter differentiation is within normal limits throughout the brain. Vascular: Mild Calcified atherosclerosis at the skull base. Skull: Benign-appearing and circumscribed 12 mm lucent area along the left lateral calvarium on series 3, image 53. No skull fracture identified. Other: Left forehead scalp hematoma and laceration with some subcutaneous gas measures up to 9 mm in thickness. Underlying left frontal bone and left frontal sinus intact. Negative scalp soft tissues elsewhere. CT MAXILLOFACIAL FINDINGS Osseous: Mandible intact. Left TMJ degeneration. No maxilla or zygoma fracture. Nasal bones also appear to remain intact. Central skull base intact. Advanced cervical spine degeneration from C3 inferiorly. Orbits: Intact orbital walls. Globes and intraorbital soft tissues appear normal. Sinuses: Clear throughout. Tympanic cavities and mastoids are clear. Soft tissues: Negative visible noncontrast  thyroid, larynx, pharynx, parapharyngeal spaces, retropharyngeal space, sublingual space, submandibular, parotid and masticator spaces. No upper cervical lymphadenopathy. IMPRESSION: 1. Left forehead scalp hematoma and laceration without underlying skull fracture. No facial fracture identified. 2. Normal noncontrast CT appearance of the brain. 3. Circumscribed and benign appearing lucent lesion of the left lateral calvarium (series 3, image 53). Consider a 88-month follow-up Noncontrast Head CT to document stability. Electronically Signed   By: Odessa Fleming M.D.   On: 05/16/2019 19:43   CT Maxillofacial Wo Contrast  Result Date: 05/16/2019 CLINICAL DATA:  59 year old female status post blunt trauma to  the face, fall while walking dog. Lacerations. EXAM: CT HEAD WITHOUT CONTRAST CT MAXILLOFACIAL WITHOUT CONTRAST TECHNIQUE: Multidetector CT imaging of the head and maxillofacial structures were performed using the standard protocol without intravenous contrast. Multiplanar CT image reconstructions of the maxillofacial structures were also generated. COMPARISON:  None. FINDINGS: CT HEAD FINDINGS Brain: No midline shift, ventriculomegaly, mass effect, evidence of mass lesion, intracranial hemorrhage or evidence of cortically based acute infarction. Gray-white matter differentiation is within normal limits throughout the brain. Vascular: Mild Calcified atherosclerosis at the skull base. Skull: Benign-appearing and circumscribed 12 mm lucent area along the left lateral calvarium on series 3, image 53. No skull fracture identified. Other: Left forehead scalp hematoma and laceration with some subcutaneous gas measures up to 9 mm in thickness. Underlying left frontal bone and left frontal sinus intact. Negative scalp soft tissues elsewhere. CT MAXILLOFACIAL FINDINGS Osseous: Mandible intact. Left TMJ degeneration. No maxilla or zygoma fracture. Nasal bones also appear to remain intact. Central skull base intact. Advanced  cervical spine degeneration from C3 inferiorly. Orbits: Intact orbital walls. Globes and intraorbital soft tissues appear normal. Sinuses: Clear throughout. Tympanic cavities and mastoids are clear. Soft tissues: Negative visible noncontrast thyroid, larynx, pharynx, parapharyngeal spaces, retropharyngeal space, sublingual space, submandibular, parotid and masticator spaces. No upper cervical lymphadenopathy. IMPRESSION: 1. Left forehead scalp hematoma and laceration without underlying skull fracture. No facial fracture identified. 2. Normal noncontrast CT appearance of the brain. 3. Circumscribed and benign appearing lucent lesion of the left lateral calvarium (series 3, image 53). Consider a 43-month follow-up Noncontrast Head CT to document stability. Electronically Signed   By: Odessa Fleming M.D.   On: 05/16/2019 19:43    Procedures .Marland KitchenLaceration Repair  Date/Time: 05/16/2019 8:30 PM Performed by: Jacalyn Lefevre, MD Authorized by: Jacalyn Lefevre, MD   Consent:    Consent obtained:  Verbal   Consent given by:  Patient   Risks discussed:  Pain and infection   Alternatives discussed:  No treatment Anesthesia (see MAR for exact dosages):    Anesthesia method:  None Laceration details:    Location:  Face   Face location:  Forehead   Length (cm):  1 Repair type:    Repair type:  Simple Treatment:    Area cleansed with:  Saline   Amount of cleaning:  Extensive   Irrigation solution:  Sterile saline Skin repair:    Repair method:  Tissue adhesive Post-procedure details:    Dressing:  Open (no dressing)   Patient tolerance of procedure:  Tolerated well, no immediate complications .Marland KitchenLaceration Repair  Date/Time: 05/16/2019 8:30 PM Performed by: Jacalyn Lefevre, MD Authorized by: Jacalyn Lefevre, MD   Consent:    Consent obtained:  Verbal   Consent given by:  Patient   Risks discussed:  Pain Anesthesia (see MAR for exact dosages):    Anesthesia method:  None Laceration details:     Location:  Face   Face location:  Nose   Length (cm):  1 Treatment:    Area cleansed with:  Saline   Amount of cleaning:  Extensive   Irrigation solution:  Sterile saline Skin repair:    Repair method:  Tissue adhesive Post-procedure details:    Dressing:  Open (no dressing)   Patient tolerance of procedure:  Tolerated well, no immediate complications   (including critical care time)  Medications Ordered in ED Medications - No data to display  ED Course  I have reviewed the triage vital signs and the nursing notes.  Pertinent labs &  imaging results that were available during my care of the patient were reviewed by me and considered in my medical decision making (see chart for details).    MDM Rules/Calculators/A&P                       I had to dermabond the nose as well as the forehead to get it to stop bleeding.  Pt did not want an xray of her knee.    Pt d/w Dr. Iran Planas who recommended f/u in her office in a week.  Pt is stable for d/c.  Return if worse. Final Clinical Impression(s) / ED Diagnoses Final diagnoses:  Fall, initial encounter  Contusion of face, initial encounter  Contusion of left knee, initial encounter  Laceration of nose, initial encounter    Rx / DC Orders ED Discharge Orders    None       Isla Pence, MD 05/16/19 2037

## 2019-05-16 NOTE — ED Notes (Signed)
ED Provider at bedside. 

## 2019-05-16 NOTE — ED Triage Notes (Signed)
Face planted onto hard top while walking dog.  Lac to forehead and top of nose.

## 2019-05-21 ENCOUNTER — Other Ambulatory Visit: Payer: Self-pay

## 2019-05-21 ENCOUNTER — Ambulatory Visit (INDEPENDENT_AMBULATORY_CARE_PROVIDER_SITE_OTHER): Payer: 59 | Admitting: Medical

## 2019-05-21 VITALS — BP 110/70 | HR 107 | Temp 97.3°F | Resp 17 | Ht 64.0 in | Wt 219.6 lb

## 2019-05-21 DIAGNOSIS — I10 Essential (primary) hypertension: Secondary | ICD-10-CM | POA: Diagnosis not present

## 2019-05-21 DIAGNOSIS — S0121XD Laceration without foreign body of nose, subsequent encounter: Secondary | ICD-10-CM | POA: Diagnosis not present

## 2019-05-21 DIAGNOSIS — F988 Other specified behavioral and emotional disorders with onset usually occurring in childhood and adolescence: Secondary | ICD-10-CM | POA: Diagnosis not present

## 2019-05-21 NOTE — Progress Notes (Signed)
   Subjective:    Patient ID: [name], female    DOB: [birth year], 59 y.o.   MRN: [id number]  HPI  Pt in for wellness exam as well to fill out forms for nursing school.   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  Pt is up to date on vaccine and ppd etc. So I don't need to fill out that form per pt.  Pt bp is well controlled. Depression and ADD well controlled.  Pt did have nose laceration. I reviewed ED note the other day. Pt states on sore bridge of nose. She does have plastic surgery appointment pending.       Review of Systems     Objective:   Physical Exam  General- No acute distress. Pleasant patient. Neck- Full range of motion, no jvd Lungs- Clear, even and unlabored. Heart- regular rate and rhythm. Neurologic- CNII- XII grossly intact.       Assessment & Plan:  Htn well controlled on current tx.   ADD treated well with vyvanse.  Depression well controlled with prozac.   Nose laceration healing by secondary intention. Plastic referral pending.  Filled out pt NP clinical rotation form.  Follow up in 3 months or as needed  30 spent with pt.

## 2019-05-21 NOTE — Patient Instructions (Addendum)
Htn well controlled on current tx.   ADD treated well with vyvanse.  Depression well controlled with prozac.   Nose laceration healing by secondary intention. Plastic referral pending.  Filled out pt NP clinical rotation form.  Follow up in 3 months or as needed

## 2019-06-09 ENCOUNTER — Encounter: Payer: Self-pay | Admitting: Medical

## 2019-06-09 ENCOUNTER — Other Ambulatory Visit: Payer: Self-pay | Admitting: Medical

## 2019-06-09 NOTE — Telephone Encounter (Signed)
Requesting: vyvanse Contract:11/04/18 UDS:09/26/18 Last Visit:05/21/19 Next Visit:n/a Last Refill:05/14/19  Please Advise

## 2019-06-10 ENCOUNTER — Telehealth: Payer: Self-pay | Admitting: Medical

## 2019-06-10 MED ORDER — LISDEXAMFETAMINE DIMESYLATE 50 MG PO CAPS: 50.0000 mg | ORAL_CAPSULE | Freq: Every day | ORAL | 0 refills | Status: DC

## 2019-06-10 MED FILL — VYVANSE 50 MG CAPSULE: 50 | 30 days supply | Qty: 30 | Fill #0

## 2019-06-10 NOTE — Telephone Encounter (Signed)
Opened to rx med.

## 2019-06-10 NOTE — Telephone Encounter (Signed)
Rx vyvanse sent to pt pharmacy. 

## 2019-06-12 MED FILL — OMEPRAZOLE 20 MG CAP: 20 | 30 days supply | Qty: 30 | Fill #3

## 2019-06-16 ENCOUNTER — Encounter: Payer: Self-pay | Admitting: Medical

## 2019-07-09 ENCOUNTER — Other Ambulatory Visit: Payer: Self-pay | Admitting: Medical

## 2019-07-09 ENCOUNTER — Encounter: Payer: Self-pay | Admitting: Medical

## 2019-07-10 MED ORDER — LISDEXAMFETAMINE DIMESYLATE 50 MG PO CAPS: 50.0000 mg | ORAL_CAPSULE | Freq: Every day | ORAL | 0 refills | Status: DC

## 2019-07-10 MED FILL — VYVANSE 50 MG CAPSULE: 50 | 30 days supply | Qty: 30 | Fill #0

## 2019-07-10 MED FILL — FLUOXETINE HCL 20 MG TABS: 20 | 30 days supply | Qty: 30 | Fill #2

## 2019-07-10 NOTE — Telephone Encounter (Signed)
Refilled vyvanse. Up todate uds, contract and review controlled med site.

## 2019-07-16 ENCOUNTER — Other Ambulatory Visit (HOSPITAL_BASED_OUTPATIENT_CLINIC_OR_DEPARTMENT_OTHER): Payer: Self-pay | Admitting: Medical

## 2019-07-16 ENCOUNTER — Encounter: Payer: Self-pay | Admitting: Medical

## 2019-07-16 MED ORDER — OMEPRAZOLE 20 MG PO CPDR: 20.0000 mg | DELAYED_RELEASE_CAPSULE | Freq: Every day | ORAL | 3 refills | Status: DC

## 2019-07-16 MED FILL — OMEPRAZOLE 20 MG CAP: 20 | 30 days supply | Qty: 30 | Fill #0

## 2019-07-17 ENCOUNTER — Encounter: Payer: Self-pay | Admitting: Medical

## 2019-07-17 MED ORDER — LISINOPRIL 40 MG PO TABS: 40.0000 mg | ORAL_TABLET | Freq: Every day | ORAL | 1 refills | Status: DC

## 2019-07-17 MED FILL — LISINOPRIL 40 MG TABS: 40 | 90 days supply | Qty: 90 | Fill #0

## 2019-08-12 ENCOUNTER — Other Ambulatory Visit: Payer: Self-pay | Admitting: Medical

## 2019-08-12 ENCOUNTER — Encounter: Payer: Self-pay | Admitting: Medical

## 2019-08-12 MED ORDER — LISDEXAMFETAMINE DIMESYLATE 50 MG PO CAPS: 50.0000 mg | ORAL_CAPSULE | Freq: Every day | ORAL | 0 refills | Status: DC

## 2019-08-12 MED FILL — VYVANSE 50 MG CAPSULE: 50 | 30 days supply | Qty: 30 | Fill #0

## 2019-08-12 NOTE — Telephone Encounter (Addendum)
Requesting:Vyvanse Contract: 09/26/2018 UDS:10/14/2018  Last Visit:05/21/2019 Next Visit:NONE Last Refill:07/10/2019  Please Advise  Reviewed controlled med site today.  Sent over refill today.

## 2019-08-13 ENCOUNTER — Telehealth: Payer: Self-pay | Admitting: Medical

## 2019-08-13 MED ORDER — AMLODIPINE BESYLATE 10 MG PO TABS: 10.0000 mg | ORAL_TABLET | Freq: Every day | ORAL | 1 refills | Status: DC

## 2019-08-13 NOTE — Telephone Encounter (Signed)
Refilled pt norvasc

## 2019-08-14 ENCOUNTER — Ambulatory Visit: Payer: 59 | Admitting: Medical

## 2019-08-14 ENCOUNTER — Encounter: Payer: Self-pay | Admitting: Medical

## 2019-08-14 MED FILL — AMLODIPINE BESYLATE 10 MG T: 10 | 90 days supply | Qty: 90 | Fill #0

## 2019-08-28 ENCOUNTER — Encounter: Payer: Self-pay | Admitting: Medical

## 2019-08-28 ENCOUNTER — Other Ambulatory Visit (HOSPITAL_BASED_OUTPATIENT_CLINIC_OR_DEPARTMENT_OTHER): Payer: Self-pay | Admitting: Medical

## 2019-08-28 MED ORDER — OMEPRAZOLE 20 MG PO CPDR: 20.0000 mg | DELAYED_RELEASE_CAPSULE | Freq: Every day | ORAL | 6 refills | Status: DC

## 2019-08-28 MED FILL — OMEPRAZOLE DR 20 MG CAPSULE: 20 | 30 days supply | Qty: 30 | Fill #0

## 2019-09-10 ENCOUNTER — Encounter: Payer: Self-pay | Admitting: Medical

## 2019-09-12 ENCOUNTER — Ambulatory Visit (INDEPENDENT_AMBULATORY_CARE_PROVIDER_SITE_OTHER): Payer: 59 | Admitting: Medical

## 2019-09-12 ENCOUNTER — Other Ambulatory Visit: Payer: Self-pay

## 2019-09-12 ENCOUNTER — Encounter: Payer: Self-pay | Admitting: Medical

## 2019-09-12 VITALS — BP 128/86 | HR 107 | Resp 18 | Ht 64.0 in | Wt 216.6 lb

## 2019-09-12 DIAGNOSIS — F988 Other specified behavioral and emotional disorders with onset usually occurring in childhood and adolescence: Secondary | ICD-10-CM | POA: Diagnosis not present

## 2019-09-12 DIAGNOSIS — F329 Major depressive disorder, single episode, unspecified: Secondary | ICD-10-CM

## 2019-09-12 DIAGNOSIS — I10 Essential (primary) hypertension: Secondary | ICD-10-CM

## 2019-09-12 DIAGNOSIS — F32A Depression, unspecified: Secondary | ICD-10-CM

## 2019-09-12 MED ORDER — LISDEXAMFETAMINE DIMESYLATE 50 MG PO CAPS: 50.0000 mg | ORAL_CAPSULE | Freq: Every day | ORAL | 0 refills | Status: DC

## 2019-09-12 MED ORDER — CHLORTHALIDONE 25 MG PO TABS: 25.0000 mg | ORAL_TABLET | Freq: Every day | ORAL | 0 refills | Status: DC

## 2019-09-12 MED ORDER — BUPROPION HCL ER (XL) 150 MG PO TB24: 150.0000 mg | ORAL_TABLET | Freq: Every day | ORAL | 2 refills | Status: DC

## 2019-09-12 MED ORDER — METFORMIN HCL 500 MG PO TABS: 500.0000 mg | ORAL_TABLET | Freq: Every day | ORAL | 1 refills | Status: DC

## 2019-09-12 MED FILL — METFORMIN HCL 500 MG TABS: 500 | 30 days supply | Qty: 30 | Fill #0

## 2019-09-12 MED FILL — VYVANSE 50 MG CAPSULE: 50 | 30 days supply | Qty: 30 | Fill #0

## 2019-09-12 MED FILL — CHLORTHALIDONE 25 MG TABS: 25 | 30 days supply | Qty: 30 | Fill #0

## 2019-09-12 MED FILL — buPROPion HCL ER (XL) 150 M: 150 | 30 days supply | Qty: 30 | Fill #0

## 2019-09-12 MED FILL — OMEPRAZOLE DR 20 MG CAPSULE: 20 | 30 days supply | Qty: 30 | Fill #0

## 2019-09-12 NOTE — Patient Instructions (Addendum)
You updated your contract today and uds. Pt needs refill of vyvanse today. Controlled me site reviewed today.  For htn adding chlorthalidone 25 gm. Continue ace inhibitor. Stop amlodipoine.  For weight loss low dose metformin.  For depression stop prozac due to side effects and use wellbutrin but wait until Monday to start as want you bp to be better before starting wellbutrin.  Follow up my chart bp update in 7 days.

## 2019-09-12 NOTE — Progress Notes (Addendum)
Subjective:    Patient ID: [name], female    DOB: [birth year], 59 y.o.   MRN: [id number]  HPI  Pt in for follow up.  Pt states she stopped prozac since decreased libido. Also clenched teeth too much. She stopped prozac. She started back on wellbutrin which she had and feels like helping.   Pt had been on wellbutrin in past no side effects.  Bp is high. She was on amlodipine. Did not like effects of pedal edema. Has not been on medication for 3 weeks. She is on lisinopril. She does not want to be on b blocker.  ADD. Needed update on uds and contract. Pt bp had been controlled when on amlodipine. Pt did well with med. 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 Vyvanse helping concentration significantly.   Review of Systems  Constitutional: Negative for chills, fatigue and fever.  Respiratory: Negative for cough, chest tightness, shortness of breath and wheezing.   Cardiovascular: Negative for chest pain and palpitations.  Gastrointestinal: Negative for abdominal pain.  Musculoskeletal: Negative for back pain.  Skin: Negative for rash.  Neurological: Negative for dizziness, syncope, weakness and headaches.  Hematological: Negative for adenopathy. Does not bruise/bleed easily.  Psychiatric/Behavioral: Positive for decreased concentration and dysphoric mood. Negative for behavioral problems, confusion, sleep disturbance and suicidal ideas. The patient is not nervous/anxious.        But controlled with meds.     Past Medical History:  Diagnosis Date  . ADD (attention deficit disorder)   . Arthritis   . GERD (gastroesophageal reflux disease)   . Hypertension      Social History   Socioeconomic History  . Marital status: Divorced    Spouse name: Not on file  . Number of children: Not on file  . Years of education: Not on file  . Highest education level: Not on file  Occupational History  . Not on file  Tobacco Use  . Smoking status: Never Smoker  . Smokeless  tobacco: Never Used  Substance and Sexual Activity  . Alcohol use: No  . Drug use: No  . Sexual activity: Not on file  Other Topics Concern  . Not on file  Social History Narrative  . Not on file   Social Determinants of Health   Financial Resource Strain:   . Difficulty of Paying Living Expenses:   Food Insecurity:   . Worried About Charity fundraiser in the Last Year:   . Arboriculturist in the Last Year:   Transportation Needs:   . Film/video editor (Medical):   Marland Kitchen Lack of Transportation (Non-Medical):   Physical Activity:   . Days of Exercise per Week:   . Minutes of Exercise per Session:   Stress:   . Feeling of Stress :   Social Connections:   . Frequency of Communication with Friends and Family:   . Frequency of Social Gatherings with Friends and Family:   . Attends Religious Services:   . Active Member of Clubs or Organizations:   . Attends Archivist Meetings:   Marland Kitchen Marital Status:   Intimate Partner Violence:   . Fear of Current or Ex-Partner:   . Emotionally Abused:   Marland Kitchen Physically Abused:   . Sexually Abused:     Past Surgical History:  Procedure Laterality Date  . CESAREAN SECTION    . TONSILLECTOMY      No family history on file.  No Known Allergies  Current Outpatient  Medications on File Prior to Visit  Medication Sig Dispense Refill  . clobetasol ointment (TEMOVATE) 0.05 % Apply 1 application topically 2 (two) times daily. 30 g 1  . lisdexamfetamine (VYVANSE) 50 MG capsule Take 1 capsule (50 mg total) by mouth daily. 30 capsule 0  . lisinopril (ZESTRIL) 40 MG tablet Take 1 tablet (40 mg total) by mouth daily. 90 tablet 1  . omeprazole (PRILOSEC) 20 MG capsule Take 1 capsule (20 mg total) by mouth daily. 30 capsule 6  . amLODipine (NORVASC) 10 MG tablet Take 1 tablet (10 mg total) by mouth daily. (Patient not taking: Reported on 09/12/2019) 90 tablet 1  . FLUoxetine (PROZAC) 20 MG tablet Take 1 tablet (20 mg total) by mouth daily.  (Patient not taking: Reported on 09/12/2019) 30 tablet 3   No current facility-administered medications on file prior to visit.    BP (!) 146/102 (BP Location: Left Arm, Patient Position: Sitting, Cuff Size: Large)   Pulse (!) 107   Resp 18   Ht 5\' 4"  (1.626 m)   Wt 216 lb 9.6 oz (98.2 kg)   LMP 12/25/2012 (Approximate)   SpO2 98%   BMI 37.18 kg/m       Objective:   Physical Exam   General Mental Status- Alert. General Appearance- Not in acute distress.   Skin General: Color- Normal Color. Moisture- Normal Moisture.  Neck Carotid Arteries- Normal color. Moisture- Normal Moisture. No carotid bruits. No JVD.  Chest and Lung Exam Auscultation: Breath Sounds:-Normal.  Cardiovascular Auscultation:Rythm- Regular. Murmurs & Other Heart Sounds:Auscultation of the heart reveals- No Murmurs.  Abdomen Inspection:-Inspeection Normal. Palpation/Percussion:Note:No mass. Palpation and Percussion of the abdomen reveal- Non Tender, Non Distended + BS, no rebound or guarding.    Neurologic Cranial Nerve exam:- CN III-XII intact(No nystagmus), symmetric smile. Strength:- 5/5 equal and symmetric strength both upper and lower extremities.     Assessment & Plan:  You updated your contract today and uds. Pt needs refill of vyvanse today. Controlled me site reviewed today.  For htn adding chlorthalidone 25 gm. Continue ace inhibitor. Stop amlodipoine.  For weight loss low dose metformin.  For depression stop prozac due to side effects and use wellbutrin but wait until Monday to start as want you bp to be better.  Follow up my chart bp update in 7 days.  Time spent with patient today was 30  minutes which consisted of chart review, discussing diagnoses, work up, treatment and documentation.  Sending note for supervison DO to review controlled med RX.  Friday, PA-C

## 2019-09-14 LAB — DRUG MONITORING, PANEL 8 WITH CONFIRMATION, URINE
6 Acetylmorphine: NEGATIVE ng/mL (ref <10)
Alcohol Metabolites: POSITIVE ng/mL — AB
Amphetamine: 1761 ng/mL — ABNORMAL HIGH (ref <250)
Amphetamines: POSITIVE ng/mL — AB (ref <500)
Benzodiazepines: NEGATIVE ng/mL (ref <100)
Buprenorphine, Urine: NEGATIVE ng/mL (ref <5)
Cocaine Metabolite: NEGATIVE ng/mL (ref <150)
Creatinine: 35.1 mg/dL
Ethyl Glucuronide (ETG): 524 ng/mL — ABNORMAL HIGH (ref <500)
Ethyl Sulfate (ETS): 142 ng/mL — ABNORMAL HIGH (ref <100)
MDMA: NEGATIVE ng/mL (ref <500)
Marijuana Metabolite: NEGATIVE ng/mL (ref <20)
Methamphetamine: NEGATIVE ng/mL (ref <250)
Opiates: NEGATIVE ng/mL (ref <100)
Oxidant: NEGATIVE ug/mL
Oxycodone: NEGATIVE ng/mL (ref <100)
pH: 7.3 (ref 4.5–9.0)

## 2019-09-14 LAB — DM TEMPLATE

## 2019-09-21 ENCOUNTER — Encounter: Payer: Self-pay | Admitting: Medical

## 2019-09-30 ENCOUNTER — Encounter: Payer: Self-pay | Admitting: Medical

## 2019-10-01 ENCOUNTER — Telehealth: Payer: Self-pay | Admitting: Medical

## 2019-10-01 MED ORDER — TRAZODONE HCL 50 MG PO TABS: 25.0000 mg | ORAL_TABLET | Freq: Every evening | ORAL | 0 refills | Status: DC | PRN

## 2019-10-01 MED FILL — traZODone HCL 50 MG TABS: 50 | 30 days supply | Qty: 30 | Fill #0

## 2019-10-01 NOTE — Telephone Encounter (Signed)
Rx trazadone sent to pt pharmacy. 

## 2019-10-08 DIAGNOSIS — M25562 Pain in left knee: Secondary | ICD-10-CM | POA: Diagnosis not present

## 2019-10-10 ENCOUNTER — Encounter: Payer: Self-pay | Admitting: Medical

## 2019-10-10 ENCOUNTER — Other Ambulatory Visit: Payer: Self-pay | Admitting: Medical

## 2019-10-10 MED ORDER — CHLORTHALIDONE 25 MG PO TABS: 25.0000 mg | ORAL_TABLET | Freq: Every day | ORAL | 5 refills | Status: DC

## 2019-10-10 MED FILL — CHLORTHALIDONE 25 MG TABS: 25 | 30 days supply | Qty: 30 | Fill #0

## 2019-10-10 NOTE — Telephone Encounter (Signed)
Requesting:Vyvanse Contract:09/24/2019 UDS: none Last Visit:09/12/2019 Next Visit:none Last Refill:09/12/2019  Please Advise

## 2019-10-10 NOTE — Telephone Encounter (Signed)
Requesting: vyvanse Contract:09/24/19 UDS:09/12/19 Last Visit:09/12/19 Next Visit:n/a Last Refill:09/12/19  Please Advise

## 2019-10-11 ENCOUNTER — Telehealth: Payer: Self-pay | Admitting: Medical

## 2019-10-11 MED ORDER — LISDEXAMFETAMINE DIMESYLATE 50 MG PO CAPS: 50.0000 mg | ORAL_CAPSULE | Freq: Every day | ORAL | 0 refills | Status: DC

## 2019-10-11 MED FILL — VYVANSE 50 MG CAPSULE: 50 | 30 days supply | Qty: 30 | Fill #0

## 2019-10-11 NOTE — Telephone Encounter (Signed)
Rx vyvanse refill sent to pt pharmacy. 

## 2019-10-13 NOTE — Telephone Encounter (Signed)
[name]- can you deny this request? You have already refilled but it won't let me deny it.

## 2019-11-11 ENCOUNTER — Other Ambulatory Visit: Payer: Self-pay | Admitting: Medical

## 2019-11-11 MED ORDER — LISDEXAMFETAMINE DIMESYLATE 50 MG PO CAPS: 50.0000 mg | ORAL_CAPSULE | Freq: Every day | ORAL | 0 refills | Status: DC

## 2019-11-11 NOTE — Telephone Encounter (Signed)
Rx vyvanse sent to pt pharmacy. 

## 2019-11-11 NOTE — Telephone Encounter (Signed)
Requesting: vyvanse  Contract:09/24/19 UDS:09/12/19 Last Visit:09/12/19 Next Visit:n/a Last Refill:10/11/19  Please Advise

## 2019-11-12 MED FILL — VYVANSE 50 MG CAPSULE: 50 | 30 days supply | Qty: 30 | Fill #0

## 2019-11-14 MED FILL — OMEPRAZOLE 20 MG CAP: 20 | 30 days supply | Qty: 30 | Fill #1

## 2019-11-14 MED FILL — buPROPion HCL ER (XL) 150 M: 150 | 30 days supply | Qty: 30 | Fill #1

## 2019-11-14 MED FILL — LISINOPRIL 40 MG TABS: 40 | 90 days supply | Qty: 90 | Fill #1

## 2019-11-14 MED FILL — CHLORTHALIDONE 25 MG TABS: 25 | 30 days supply | Qty: 30 | Fill #1

## 2019-11-22 ENCOUNTER — Ambulatory Visit: Payer: 59 | Attending: Internal Medicine

## 2019-11-22 DIAGNOSIS — Z23 Encounter for immunization: Secondary | ICD-10-CM

## 2019-11-22 NOTE — Progress Notes (Signed)
   Covid-19 Vaccination Clinic  Name:  [name]    MRN: [id number] DOB: 1960/06/21  11/22/2019  [name] was observed post Covid-19 immunization for 15 minutes without incident. She was provided with Vaccine Information Sheet and instruction to access the V-Safe system.   [name] was instructed to call 911 with any severe reactions post vaccine: Marland Kitchen Difficulty breathing  . Swelling of face and throat  . A fast heartbeat  . A bad rash all over body  . Dizziness and weakness   Immunizations Administered    Name Date Dose VIS Date Route   JANSSEN COVID-19 VACCINE 11/22/2019 10:04 AM 0.5 mL 05/24/2019 Intramuscular   Manufacturer: [employer]   Lot: [id number]   NDC: 11216-244-69

## 2019-12-12 ENCOUNTER — Other Ambulatory Visit: Payer: Self-pay | Admitting: Medical

## 2019-12-12 MED ORDER — TRAZODONE HCL 50 MG PO TABS: 25.0000 mg | ORAL_TABLET | Freq: Every evening | ORAL | 0 refills | Status: DC | PRN

## 2019-12-12 MED FILL — traZODone HCL 50 MG TABS: 50 | 30 days supply | Qty: 30 | Fill #0

## 2019-12-12 NOTE — Telephone Encounter (Signed)
Last written: 11/11/19 Last ov: 09/12/19 Next ov: none Contract: 09/26/18 UDS: 09/12/19

## 2019-12-15 ENCOUNTER — Other Ambulatory Visit: Payer: Self-pay | Admitting: Medical

## 2019-12-16 ENCOUNTER — Encounter: Payer: Self-pay | Admitting: Medical

## 2019-12-16 NOTE — Telephone Encounter (Addendum)
Requesting: Vyvanse Contract:09/12/2019 UDS:09/12/2019 Last Visit:09/12/2019 Next Visit:none scheduled Last Refill: 11/11/2019  Please Advise  I refilled but get her scheduled for follow up in one month.

## 2019-12-16 NOTE — Telephone Encounter (Signed)
Requesting: vyvanse Contract:09/24/19 UDS:09/12/19 Last Visit:09/12/19 Next Visit:n/a Last Refill:11/11/19  Please Advise

## 2019-12-17 MED ORDER — LISDEXAMFETAMINE DIMESYLATE 50 MG PO CAPS: 50.0000 mg | ORAL_CAPSULE | Freq: Every day | ORAL | 0 refills | Status: DC

## 2019-12-17 MED FILL — VYVANSE 50 MG CAPSULE: 50 | 30 days supply | Qty: 30 | Fill #0

## 2019-12-18 MED FILL — buPROPion HCL ER (XL) 150 M: 150 | 30 days supply | Qty: 30 | Fill #2

## 2019-12-18 MED FILL — CHLORTHALIDONE 25 MG TABS: 25 | 30 days supply | Qty: 30 | Fill #2

## 2019-12-18 MED FILL — OMEPRAZOLE 20 MG CAP: 20 | 30 days supply | Qty: 30 | Fill #2

## 2020-01-14 ENCOUNTER — Telehealth: Payer: Self-pay | Admitting: Medical

## 2020-01-14 ENCOUNTER — Other Ambulatory Visit (HOSPITAL_BASED_OUTPATIENT_CLINIC_OR_DEPARTMENT_OTHER): Payer: Self-pay | Admitting: Medical

## 2020-01-14 ENCOUNTER — Encounter: Payer: Self-pay | Admitting: Medical

## 2020-01-14 MED ORDER — LISDEXAMFETAMINE DIMESYLATE 50 MG PO CAPS: 50.0000 mg | ORAL_CAPSULE | Freq: Every day | ORAL | 0 refills | Status: DC

## 2020-01-14 MED ORDER — CHLORTHALIDONE 25 MG PO TABS: 25.0000 mg | ORAL_TABLET | Freq: Every day | ORAL | 5 refills | Status: DC

## 2020-01-14 NOTE — Telephone Encounter (Signed)
Rx vyvanse and chlorthalidone sent to pharmacy.

## 2020-01-15 MED FILL — CHLORTHALIDONE 25 MG TABS: 25 | 30 days supply | Qty: 30 | Fill #0

## 2020-01-15 MED FILL — VYVANSE 50 MG CAPSULE: 50 | 30 days supply | Qty: 30 | Fill #0

## 2020-01-21 ENCOUNTER — Ambulatory Visit: Payer: 59 | Admitting: Medical

## 2020-01-26 ENCOUNTER — Ambulatory Visit: Payer: 59 | Admitting: Medical

## 2020-02-16 ENCOUNTER — Ambulatory Visit: Payer: 59 | Admitting: Medical

## 2020-02-16 ENCOUNTER — Encounter: Payer: Self-pay | Admitting: Medical

## 2020-02-18 ENCOUNTER — Other Ambulatory Visit: Payer: Self-pay | Admitting: Medical

## 2020-02-18 ENCOUNTER — Other Ambulatory Visit (HOSPITAL_BASED_OUTPATIENT_CLINIC_OR_DEPARTMENT_OTHER): Payer: Self-pay | Admitting: Medical

## 2020-02-18 MED ORDER — TRAZODONE HCL 50 MG PO TABS: 25.0000 mg | ORAL_TABLET | Freq: Every evening | ORAL | 3 refills | Status: DC | PRN

## 2020-02-18 MED FILL — LISINOPRIL 40 MG TABS: 40 | 90 days supply | Qty: 90 | Fill #0

## 2020-02-18 MED FILL — CHLORTHALIDONE 25 MG TABS: 25 | 30 days supply | Qty: 30 | Fill #1

## 2020-02-18 MED FILL — buPROPion HCL ER (XL) 150 M: 150 | 30 days supply | Qty: 30 | Fill #0

## 2020-02-18 MED FILL — traZODone HCL 50 MG TABS: 50 | 30 days supply | Qty: 30 | Fill #0

## 2020-02-18 MED FILL — OMEPRAZOLE 20 MG CAP: 20 | 30 days supply | Qty: 30 | Fill #3

## 2020-02-23 ENCOUNTER — Other Ambulatory Visit (HOSPITAL_BASED_OUTPATIENT_CLINIC_OR_DEPARTMENT_OTHER): Payer: Self-pay | Admitting: Medical

## 2020-02-23 ENCOUNTER — Ambulatory Visit: Payer: 59 | Admitting: Medical

## 2020-02-23 ENCOUNTER — Other Ambulatory Visit: Payer: Self-pay

## 2020-02-23 VITALS — BP 100/57 | HR 100 | Resp 20 | Ht 64.0 in | Wt 211.0 lb

## 2020-02-23 DIAGNOSIS — M25541 Pain in joints of right hand: Secondary | ICD-10-CM | POA: Diagnosis not present

## 2020-02-23 DIAGNOSIS — F988 Other specified behavioral and emotional disorders with onset usually occurring in childhood and adolescence: Secondary | ICD-10-CM

## 2020-02-23 DIAGNOSIS — K219 Gastro-esophageal reflux disease without esophagitis: Secondary | ICD-10-CM | POA: Diagnosis not present

## 2020-02-23 DIAGNOSIS — M25542 Pain in joints of left hand: Secondary | ICD-10-CM | POA: Diagnosis not present

## 2020-02-23 DIAGNOSIS — I10 Essential (primary) hypertension: Secondary | ICD-10-CM | POA: Diagnosis not present

## 2020-02-23 DIAGNOSIS — Z79899 Other long term (current) drug therapy: Secondary | ICD-10-CM | POA: Diagnosis not present

## 2020-02-23 MED ORDER — LISDEXAMFETAMINE DIMESYLATE 50 MG PO CAPS: 50.0000 mg | ORAL_CAPSULE | Freq: Every day | ORAL | 0 refills | Status: DC

## 2020-02-23 MED ORDER — DICLOFENAC SODIUM 1 % EX GEL: 2.0000 g | Freq: Four times a day (QID) | CUTANEOUS | 1 refills | Status: DC

## 2020-02-23 MED FILL — DICLOFENAC SODIUM 1 % GEL: 1 | 25 days supply | Qty: 100 | Fill #0

## 2020-02-23 MED FILL — VYVANSE 50 MG CAPSULE: 50 | 30 days supply | Qty: 30 | Fill #0

## 2020-02-23 NOTE — Patient Instructions (Signed)
For adhd continue vyvanse. Up to date on controlled med contract and UDS.   For arthralgia place future inflammatory lab studies. After review will put in referral to rheumatologist.  For gerd recommend continue omeprazole and eat healthy diet.  For hypertension check bp at home after you restart vyvanse. If bp still low like today then may need dose reduction of bp med. Future labs to include fasting lipid panel and cmp.  Follow up in 4 months or as needed.

## 2020-02-23 NOTE — Progress Notes (Signed)
Subjective:    Patient ID: [name], female    DOB: 03-08-1961, 59 y.o.   MRN: [id number]  HPI  Pt in for follow up.  Pt has adhd history.Does help with her concentration. On vyvase and doing well.  Pt bp is on low side. Today. I wanted to recheck her bp. Pt declines. No dizziness. No light headed sensation.  Pt also mentions that she has recent various joint pains. Pt is trying voltaren gel 2%.  Pt has gerd. Pt is on omeprazole. Resolves symptoms quick.    Review of Systems  Constitutional: Negative for chills, fatigue and fever.  Respiratory: Negative for cough, chest tightness, shortness of breath and wheezing.   Cardiovascular: Negative for chest pain and palpitations.  Gastrointestinal: Negative for abdominal pain, constipation and nausea.       Gerd  Musculoskeletal: Positive for arthralgias. Negative for back pain.  Neurological: Negative for dizziness, syncope, speech difficulty, weakness, numbness and headaches.  Hematological: Negative for adenopathy. Does not bruise/bleed easily.  Psychiatric/Behavioral: Positive for decreased concentration. Negative for behavioral problems, confusion, dysphoric mood, sleep disturbance and suicidal ideas. The patient is not nervous/anxious.     Past Medical History:  Diagnosis Date   ADD (attention deficit disorder)    Arthritis    GERD (gastroesophageal reflux disease)    Hypertension      Social History   Socioeconomic History   Marital status: Divorced    Spouse name: Not on file   Number of children: Not on file   Years of education: Not on file   Highest education level: Not on file  Occupational History   Not on file  Tobacco Use   Smoking status: Never Smoker   Smokeless tobacco: Never Used  Substance and Sexual Activity   Alcohol use: No   Drug use: No   Sexual activity: Not on file  Other Topics Concern   Not on file  Social History Narrative   Not on file   Social Determinants of  Health   Financial Resource Strain:    Difficulty of Paying Living Expenses: Not on file  Food Insecurity:    Worried About Running Out of Food in the Last Year: Not on file   Ran Out of Food in the Last Year: Not on file  Transportation Needs:    Lack of Transportation (Medical): Not on file   Lack of Transportation (Non-Medical): Not on file  Physical Activity:    Days of Exercise per Week: Not on file   Minutes of Exercise per Session: Not on file  Stress:    Feeling of Stress : Not on file  Social Connections:    Frequency of Communication with Friends and Family: Not on file   Frequency of Social Gatherings with Friends and Family: Not on file   Attends Religious Services: Not on file   Active Member of Clubs or Organizations: Not on file   Attends Banker Meetings: Not on file   Marital Status: Not on file  Intimate Partner Violence:    Fear of Current or Ex-Partner: Not on file   Emotionally Abused: Not on file   Physically Abused: Not on file   Sexually Abused: Not on file    Past Surgical History:  Procedure Laterality Date   CESAREAN SECTION     TONSILLECTOMY      No family history on file.  No Known Allergies  Current Outpatient Medications on File Prior to Visit  Medication Sig Dispense Refill  buPROPion (WELLBUTRIN XL) 150 MG 24 hr tablet TAKE 1 TABLET BY MOUTH ONCE DAILY 30 tablet 2   chlorthalidone (HYGROTON) 25 MG tablet Take 1 tablet (25 mg total) by mouth daily. 30 tablet 5   lisinopril (ZESTRIL) 40 MG tablet TAKE 1 TABLET (40 MG TOTAL) BY MOUTH DAILY. 90 tablet 1   omeprazole (PRILOSEC) 20 MG capsule Take 1 capsule (20 mg total) by mouth daily. 30 capsule 6   traZODone (DESYREL) 50 MG tablet Take 0.5-1 tablets (25-50 mg total) by mouth at bedtime as needed for sleep. 30 tablet 3   clobetasol ointment (TEMOVATE) 0.05 % Apply 1 application topically 2 (two) times daily. (Patient not taking: Reported on 02/23/2020)  30 g 1   No current facility-administered medications on file prior to visit.    BP (!) 100/57    Pulse 100    Resp 20    Ht 5\' 4"  (1.626 m)    Wt 211 lb (95.7 kg)    LMP 12/25/2012 (Approximate)    SpO2 97%    BMI 36.22 kg/m      Objective:   Physical Exam  General Mental Status- Alert. General Appearance- Not in acute distress.   Skin General: Color- Normal Color. Moisture- Normal Moisture.  Neck Carotid Arteries- Normal color. Moisture- Normal Moisture. No carotid bruits. No JVD.  Chest and Lung Exam Auscultation: Breath Sounds:-Normal.  Cardiovascular Auscultation:Rythm- Regular. Murmurs & Other Heart Sounds:Auscultation of the heart reveals- No Murmurs.  Abdomen Inspection:-Inspeection Normal. Palpation/Percussion:Note:No mass. Palpation and Percussion of the abdomen reveal- Non Tender, Non Distended + BS, no rebound or guarding.    Neurologic Cranial Nerve exam:- CN III-XII intact(No nystagmus), symmetric smile.  Strength:- 5/5 equal and symmetric strength both upper and lower extremities.      Assessment & Plan:  For adhd continue vyvanse. Up to date on controlled med contract and UDS.   For arthralgia place future inflammatory lab studies. After review will put in referral to rheumatologist.  For gerd recommend continue omeprazole and eat healthy diet.  For hypertension check bp at home after you restart vyvanse. If bp still low like today then may need dose reduction of bp med. Future labs to include fasting lipid panel and cmp.  Follow up in 4 months or as needed.  02/24/2013, PA-C

## 2020-02-25 LAB — DRUG MONITORING, PANEL 8 WITH CONFIRMATION, URINE
6 Acetylmorphine: NEGATIVE ng/mL (ref <10)
Alcohol Metabolites: NEGATIVE ng/mL
Amphetamine: 3396 ng/mL — ABNORMAL HIGH (ref <250)
Amphetamines: POSITIVE ng/mL — AB (ref <500)
Benzodiazepines: NEGATIVE ng/mL (ref <100)
Buprenorphine, Urine: NEGATIVE ng/mL (ref <5)
Cocaine Metabolite: NEGATIVE ng/mL (ref <150)
Creatinine: 72.6 mg/dL
MDMA: NEGATIVE ng/mL (ref <500)
Marijuana Metabolite: NEGATIVE ng/mL (ref <20)
Methamphetamine: NEGATIVE ng/mL (ref <250)
Opiates: NEGATIVE ng/mL (ref <100)
Oxidant: NEGATIVE ug/mL
Oxycodone: NEGATIVE ng/mL (ref <100)
pH: 7.3 (ref 4.5–9.0)

## 2020-02-25 LAB — DM TEMPLATE

## 2020-03-05 ENCOUNTER — Other Ambulatory Visit: Payer: 59

## 2020-03-07 ENCOUNTER — Other Ambulatory Visit (HOSPITAL_BASED_OUTPATIENT_CLINIC_OR_DEPARTMENT_OTHER): Payer: Self-pay | Admitting: Medical

## 2020-03-07 DIAGNOSIS — Z1231 Encounter for screening mammogram for malignant neoplasm of breast: Secondary | ICD-10-CM

## 2020-03-08 ENCOUNTER — Other Ambulatory Visit: Payer: Self-pay

## 2020-03-08 ENCOUNTER — Encounter (HOSPITAL_BASED_OUTPATIENT_CLINIC_OR_DEPARTMENT_OTHER): Payer: Self-pay

## 2020-03-08 ENCOUNTER — Ambulatory Visit (HOSPITAL_BASED_OUTPATIENT_CLINIC_OR_DEPARTMENT_OTHER)
Admission: RE | Admit: 2020-03-08 | Discharge: 2020-03-08 | Disposition: A | Discharge status: Home / Self Care | Payer: 59 | Source: Ambulatory Visit | Attending: Medical | Admitting: Medical

## 2020-03-08 DIAGNOSIS — Z1231 Encounter for screening mammogram for malignant neoplasm of breast: Secondary | ICD-10-CM | POA: Diagnosis not present

## 2020-03-09 ENCOUNTER — Other Ambulatory Visit (INDEPENDENT_AMBULATORY_CARE_PROVIDER_SITE_OTHER): Payer: 59

## 2020-03-09 DIAGNOSIS — M25541 Pain in joints of right hand: Secondary | ICD-10-CM | POA: Diagnosis not present

## 2020-03-09 DIAGNOSIS — M25542 Pain in joints of left hand: Secondary | ICD-10-CM | POA: Diagnosis not present

## 2020-03-09 DIAGNOSIS — I10 Essential (primary) hypertension: Secondary | ICD-10-CM | POA: Diagnosis not present

## 2020-03-10 ENCOUNTER — Telehealth: Payer: Self-pay | Admitting: Medical

## 2020-03-10 ENCOUNTER — Other Ambulatory Visit (HOSPITAL_BASED_OUTPATIENT_CLINIC_OR_DEPARTMENT_OTHER): Payer: Self-pay | Admitting: Medical

## 2020-03-10 DIAGNOSIS — M25542 Pain in joints of left hand: Secondary | ICD-10-CM

## 2020-03-10 DIAGNOSIS — M25541 Pain in joints of right hand: Secondary | ICD-10-CM

## 2020-03-10 LAB — ANA: Anti Nuclear Antibody (ANA): NEGATIVE

## 2020-03-10 LAB — COMPREHENSIVE METABOLIC PANEL
ALT: 18 U/L (ref 0–35)
AST: 14 U/L (ref 0–37)
Albumin: 4.2 g/dL (ref 3.5–5.2)
Alkaline Phosphatase: 73 U/L (ref 39–117)
BUN: 28 mg/dL — ABNORMAL HIGH (ref 6–23)
CO2: 29 mEq/L (ref 19–32)
Calcium: 9.3 mg/dL (ref 8.4–10.5)
Chloride: 98 mEq/L (ref 96–112)
Creatinine, Ser: 0.77 mg/dL (ref 0.40–1.20)
GFR: 85.64 mL/min (ref >60.00)
Glucose, Bld: 85 mg/dL (ref 70–99)
Potassium: 3.2 mEq/L — ABNORMAL LOW (ref 3.5–5.1)
Sodium: 138 mEq/L (ref 135–145)
Total Bilirubin: 0.5 mg/dL (ref 0.2–1.2)
Total Protein: 6.7 g/dL (ref 6.0–8.3)

## 2020-03-10 LAB — RHEUMATOID FACTOR: Rheumatoid fact SerPl-aCnc: <14 IU/mL (ref <14)

## 2020-03-10 LAB — LIPID PANEL
Cholesterol: 208 mg/dL — ABNORMAL HIGH (ref 0–200)
HDL: 68.2 mg/dL (ref >39.00)
LDL Cholesterol: 120 mg/dL — ABNORMAL HIGH (ref 0–99)
NonHDL: 139.54
Total CHOL/HDL Ratio: 3
Triglycerides: 96 mg/dL (ref 0.0–149.0)
VLDL: 19.2 mg/dL (ref 0.0–40.0)

## 2020-03-10 LAB — C-REACTIVE PROTEIN: CRP: 1.2 mg/dL (ref 0.5–20.0)

## 2020-03-10 LAB — SEDIMENTATION RATE: Sed Rate: 22 mm/hr (ref 0–30)

## 2020-03-10 MED ORDER — POTASSIUM CHLORIDE ER 10 MEQ PO TBCR: EXTENDED_RELEASE_TABLET | ORAL | 3 refills | Status: DC

## 2020-03-10 NOTE — Telephone Encounter (Signed)
Rx k-dur sent to pt pharmacy. 

## 2020-03-11 LAB — EXTRA SPECIMEN

## 2020-03-11 LAB — HLA-B27 ANTIGEN: HLA-B27 Antigen: NEGATIVE

## 2020-03-11 MED FILL — POTASSIUM CL ER 10 MEQ TAB: 10 | 90 days supply | Qty: 36 | Fill #0

## 2020-03-17 ENCOUNTER — Encounter: Payer: Self-pay | Admitting: Medical

## 2020-03-17 DIAGNOSIS — M1712 Unilateral primary osteoarthritis, left knee: Secondary | ICD-10-CM | POA: Diagnosis not present

## 2020-03-18 MED FILL — OMEPRAZOLE 20 MG CAP: 20 | 30 days supply | Qty: 30 | Fill #4

## 2020-03-18 MED FILL — traZODone HCL 50 MG TABS: 50 | 30 days supply | Qty: 30 | Fill #1

## 2020-03-18 MED FILL — CHLORTHALIDONE 25 MG TABS: 25 | 30 days supply | Qty: 30 | Fill #2

## 2020-03-24 ENCOUNTER — Encounter: Payer: Self-pay | Admitting: Medical

## 2020-03-26 ENCOUNTER — Telehealth: Payer: Self-pay | Admitting: Medical

## 2020-03-26 ENCOUNTER — Other Ambulatory Visit (HOSPITAL_BASED_OUTPATIENT_CLINIC_OR_DEPARTMENT_OTHER): Payer: Self-pay | Admitting: Medical

## 2020-03-26 MED ORDER — DESVENLAFAXINE SUCCINATE ER 50 MG PO TB24: 50.0000 mg | ORAL_TABLET | Freq: Every day | ORAL | 0 refills | Status: DC

## 2020-03-26 MED ORDER — LISDEXAMFETAMINE DIMESYLATE 50 MG PO CAPS: 50.0000 mg | ORAL_CAPSULE | Freq: Every day | ORAL | 0 refills | Status: DC

## 2020-03-26 MED FILL — VYVANSE 50 MG CAPSULE: 50 | 30 days supply | Qty: 30 | Fill #0

## 2020-03-26 MED FILL — DESVENLAFAXINE SUC ER 50 MG: 50 | 30 days supply | Qty: 30 | Fill #0

## 2020-03-26 NOTE — Telephone Encounter (Signed)
pristq and vyvanse rx sent in.

## 2020-04-12 ENCOUNTER — Telehealth: Payer: Self-pay | Admitting: Medical

## 2020-04-12 NOTE — Telephone Encounter (Signed)
Opened to review 

## 2020-04-20 MED FILL — traZODone HCL 50 MG TABS: 50 | 30 days supply | Qty: 30 | Fill #2

## 2020-04-20 MED FILL — CHLORTHALIDONE 25 MG TABS: 25 | 30 days supply | Qty: 30 | Fill #3

## 2020-04-20 MED FILL — OMEPRAZOLE 20 MG CAP: 20 | 30 days supply | Qty: 30 | Fill #5

## 2020-04-23 ENCOUNTER — Other Ambulatory Visit (HOSPITAL_BASED_OUTPATIENT_CLINIC_OR_DEPARTMENT_OTHER): Payer: Self-pay | Admitting: Medical

## 2020-04-23 ENCOUNTER — Other Ambulatory Visit: Payer: Self-pay | Admitting: Medical

## 2020-04-23 MED ORDER — DESVENLAFAXINE SUCCINATE ER 50 MG PO TB24: 50.0000 mg | ORAL_TABLET | Freq: Every day | ORAL | 0 refills | Status: DC

## 2020-04-23 MED FILL — DESVENLAFAXINE SUC ER 50 MG: 50 | 30 days supply | Qty: 30 | Fill #0

## 2020-04-23 NOTE — Telephone Encounter (Signed)
Requesting: Vyvanse 50mg  Contract: 02/23/2020 UDS: 02/23/2020 Last Visit: 02/23/2020 Next Visit: None Last Refill: 03/26/2020 #30 and 0RF  Please Advise

## 2020-04-24 ENCOUNTER — Other Ambulatory Visit (HOSPITAL_BASED_OUTPATIENT_CLINIC_OR_DEPARTMENT_OTHER): Payer: Self-pay | Admitting: Medical

## 2020-04-24 MED ORDER — LISDEXAMFETAMINE DIMESYLATE 50 MG PO CAPS: 50.0000 mg | ORAL_CAPSULE | Freq: Every day | ORAL | 0 refills | Status: DC

## 2020-04-26 MED FILL — VYVANSE 50 MG CAPSULE: 50 | 30 days supply | Qty: 30 | Fill #0

## 2020-05-05 NOTE — Progress Notes (Deleted)
   Office Visit Note  Patient: [name]             Date of Birth: 06-16-1960           MRN: [id number]             PCP: Elise Benne Referring: Elise Benne Visit Date: 05/06/2020 Occupation: _0 @  Subjective:  No chief complaint on file.   History of Present Illness: [name] is a 60 y.o. female ***   Activities of Daily Living:  Patient reports morning stiffness for *** {minute/hour:19697}.   Patient {ACTIONS;DENIES/REPORTS:21021675::"Denies"} nocturnal pain.  Difficulty dressing/grooming: {ACTIONS;DENIES/REPORTS:21021675::"Denies"} Difficulty climbing stairs: {ACTIONS;DENIES/REPORTS:21021675::"Denies"} Difficulty getting out of chair: {ACTIONS;DENIES/REPORTS:21021675::"Denies"} Difficulty using hands for taps, buttons, cutlery, and/or writing: {ACTIONS;DENIES/REPORTS:21021675::"Denies"}  No Rheumatology ROS completed.   PMFS History:  Patient Active Problem List   Diagnosis Date Noted  . HTN (hypertension) 12/25/2017    Past Medical History:  Diagnosis Date  . ADD (attention deficit disorder)   . Arthritis   . GERD (gastroesophageal reflux disease)   . Hypertension     No family history on file. Past Surgical History:  Procedure Laterality Date  . CESAREAN SECTION    . TONSILLECTOMY     Social History   Social History Narrative  . Not on file   Immunization History  Administered Date(s) Administered  . Janssen (J&J) SARS-COV-2 Vaccination 11/22/2019     Objective: Vital Signs: LMP 12/25/2012 (Approximate)    Physical Exam   Musculoskeletal Exam: ***  CDAI Exam: CDAI Score: -- Patient Global: --; Provider Global: -- Swollen: --; Tender: -- Joint Exam 05/06/2020   No joint exam has been documented for this visit   There is currently no information documented on the homunculus. Go to the Rheumatology activity and complete the homunculus joint exam.  Investigation: No additional findings.  Imaging: No results  found.  Recent Labs: Lab Results  Component Value Date   NA 138 03/09/2020   K 3.2 (L) 03/09/2020   CL 98 03/09/2020   CO2 29 03/09/2020   GLUCOSE 85 03/09/2020   BUN 28 (H) 03/09/2020   CREATININE 0.77 03/09/2020   BILITOT 0.5 03/09/2020   ALKPHOS 73 03/09/2020   AST 14 03/09/2020   ALT 18 03/09/2020   PROT 6.7 03/09/2020   ALBUMIN 4.2 03/09/2020   CALCIUM 9.3 03/09/2020    Speciality Comments: No specialty comments available.  Procedures:  No procedures performed Allergies: Patient has no known allergies.   Assessment / Plan:     Visit Diagnoses: Pain in both hands - 03/09/20: ANA negative, HLA-B27 negative, RF<14, CRP 1.2, ESR 22  Primary hypertension  Orders: No orders of the defined types were placed in this encounter.  No orders of the defined types were placed in this encounter.   Face-to-face time spent with patient was *** minutes. Greater than 50% of time was spent in counseling and coordination of care.  Follow-Up Instructions: No follow-ups on file.   Ofilia Neas, PA-C  Note - This record has been created using Dragon software.  Chart creation errors have been sought, but may not always  have been located. Such creation errors do not reflect on  the standard of medical care.

## 2020-05-06 ENCOUNTER — Ambulatory Visit: Payer: 59 | Admitting: Rheumatology

## 2020-05-06 ENCOUNTER — Encounter: Payer: Self-pay | Admitting: Medical

## 2020-05-06 DIAGNOSIS — I10 Essential (primary) hypertension: Secondary | ICD-10-CM

## 2020-05-06 DIAGNOSIS — Z8659 Personal history of other mental and behavioral disorders: Secondary | ICD-10-CM

## 2020-05-06 DIAGNOSIS — Z8719 Personal history of other diseases of the digestive system: Secondary | ICD-10-CM

## 2020-05-06 DIAGNOSIS — M79641 Pain in right hand: Secondary | ICD-10-CM

## 2020-05-19 ENCOUNTER — Ambulatory Visit: Payer: Self-pay | Admitting: Medical

## 2020-05-20 NOTE — Progress Notes (Signed)
 Office Visit Note  Patient: [name]             Date of Birth: 02/04/1961           MRN: [id number]             PCP: Saguier, Edward, PA-C Referring: Saguier, Edward, PA-C Visit Date: 06/03/2020 Occupation: @GUAROCC@  Subjective:  Pain in multiple joints.   History of Present Illness: [name] is a 59 y.o. female seen in consultation by request of her PCP.  According to the patient she developed psoriasis when she was 60 years old.  The psoriasis was mostly in her scalp and behind her years.  She was later diagnosed a dermatologist.  She was treated with only topical agents.  She states in the last 2 months the topical agents quit working and her psoriasis spread to her trunk.  She has had history of osteoarthritis in her bilateral knee joints.  She has been followed by [facility].  She has bad arthritis in her left knee joint for the last 15 years and she has had cortisone injection recently followed by Visco supplement injection.  She states she had increased pain after the Visco supplement injections and it was discontinued.  She also has discomfort in her bilateral hands for many years which has been getting worse.  She describes discomfort in her bilateral shoulders more so on the right than the left left elbow joint and her right foot.  She also has some neck stiffness.  She denies any SI joint pain.  There is no history of Achilles tendinitis, plantar fasciitis, epicondylitis.  She gives history of some irritation and redness in her eyes for the last 2 months.  No family history of psoriasis or psoriatic arthritis.  Activities of Daily Living:  Patient reports morning stiffness for 1  hour.   Patient Denies nocturnal pain.  Difficulty dressing/grooming: Denies Difficulty climbing stairs: Reports Difficulty getting out of chair: Reports Difficulty using hands for taps, buttons, cutlery, and/or writing: Reports  Review of Systems  Constitutional: Positive for fatigue.  Negative for night sweats, weight gain and weight loss.  HENT: Negative for mouth sores, trouble swallowing, trouble swallowing, mouth dryness and nose dryness.   Eyes: Positive for pain and redness. Negative for photophobia, itching, visual disturbance and dryness.  Respiratory: Negative for cough, shortness of breath and difficulty breathing.   Cardiovascular: Negative for chest pain, palpitations, hypertension, irregular heartbeat and swelling in legs/feet.  Gastrointestinal: Positive for heartburn. Negative for blood in stool, constipation and diarrhea.  Endocrine: Negative for increased urination.  Genitourinary: Negative for difficulty urinating and vaginal dryness.  Musculoskeletal: Positive for arthralgias, joint pain, joint swelling and morning stiffness. Negative for myalgias, muscle weakness, muscle tenderness and myalgias.  Skin: Positive for rash and hair loss. Negative for color change, redness, skin tightness, ulcers and sensitivity to sunlight.  Allergic/Immunologic: Negative for susceptible to infections.  Neurological: Negative for dizziness, numbness, headaches, memory loss, night sweats and weakness.  Hematological: Negative for bruising/bleeding tendency and swollen glands.  Psychiatric/Behavioral: Positive for depressed mood and sleep disturbance. Negative for confusion. The patient is not nervous/anxious.     PMFS History:  Patient Active Problem List   Diagnosis Date Noted  . Psoriasis 06/03/2020  . History of gastroesophageal reflux (GERD) 06/03/2020  . Attention deficit hyperactivity disorder (ADHD), predominantly inattentive type 06/03/2020  . History of depression 06/03/2020  . Primary insomnia 06/03/2020  . HTN (hypertension) 12/25/2017    Past Medical History:    Diagnosis Date  . ADD (attention deficit disorder)   . Arthritis   . GERD (gastroesophageal reflux disease)   . Hypertension     Family History  Problem Relation Age of Onset  . Cancer Mother    . Mental illness Father   . Alcoholism Father   . Mental illness Sister   . Suicidality Sister   . Healthy Son   . Healthy Son   . Healthy Daughter    Past Surgical History:  Procedure Laterality Date  . CESAREAN SECTION    . DILATION AND CURETTAGE OF UTERUS  1998  . TONSILLECTOMY     Social History   Social History Narrative  . Not on file   Immunization History  Administered Date(s) Administered  . Janssen (J&J) SARS-COV-2 Vaccination 11/22/2019     Objective: Vital Signs: BP 128/87 (BP Location: Left Arm, Patient Position: Sitting, Cuff Size: Large)   Pulse 92   Ht 5' 3" (1.6 m)   Wt 218 lb 12.8 oz (99.2 kg)   LMP 12/25/2012 (Approximate)   BMI 38.76 kg/m    Physical Exam Vitals and nursing note reviewed.  Constitutional:      Appearance: She is well-developed.  HENT:     Head: Normocephalic and atraumatic.  Eyes:     Conjunctiva/sclera: Conjunctivae normal.  Cardiovascular:     Rate and Rhythm: Normal rate and regular rhythm.     Heart sounds: Normal heart sounds.  Pulmonary:     Effort: Pulmonary effort is normal.     Breath sounds: Normal breath sounds.  Abdominal:     General: Bowel sounds are normal.     Palpations: Abdomen is soft.  Musculoskeletal:     Cervical back: Normal range of motion.  Lymphadenopathy:     Cervical: No cervical adenopathy.  Skin:    General: Skin is warm and dry.     Capillary Refill: Capillary refill takes less than 2 seconds.     Comments: She has psoriasis around her hairline behind the ears and over her trunk.  Neurological:     Mental Status: She is alert and oriented to person, place, and time.  Psychiatric:        Behavior: Behavior normal.      Musculoskeletal Exam: C-spine thoracic and lumbar spine with good range of motion.  She had no SI joint tenderness.  Shoulder joints, elbow joints, wrist joints, MCPs PIPs and DIPs with good range of motion.  She has some PIP and DIP prominence.  She also has some  tenderness over bilateral second MCP joint.  Hip joints with good range of motion with no tenderness.  She had limited extension of her bilateral knee joints.  She has bilateral pes cavus and hammertoes.  No synovitis was noted.  CDAI Exam: CDAI Score: - Patient Global: -; Provider Global: - Swollen: -; Tender: - Joint Exam 06/03/2020   No joint exam has been documented for this visit   There is currently no information documented on the homunculus. Go to the Rheumatology activity and complete the homunculus joint exam.  Investigation: No additional findings.  Imaging: XR Foot 2 Views Left  Result Date: 06/03/2020 First MTP, PIP and DIP narrowing was noted.  No intertarsal, tibiotalar or subtalar joint space narrowing was noted.  Dorsal spurring and posterior calcaneal spurring was noted. Impression: These findings are consistent with osteoarthritis of the foot.  XR Foot 2 Views Right  Result Date: 06/03/2020 First MTP, PIP and DIP narrowing was noted.  No intertarsal, tibiotalar or subtalar joint space narrowing was noted.  Dorsal spurring and posterior calcaneal spurring was noted. Impression: These findings are consistent with osteoarthritis of the foot.  XR Hand 2 View Left  Result Date: 06/03/2020 Severe CMC narrowing and subluxation was noted.  No MCP narrowing was noted.  PIP and DIP narrowing was noted.  Subluxation of third PIP joint was noted.  No intercarpal or radiocarpal joint space narrowing was noted.  Cystic/erovise changes were noted in the DIP joints. Impression: These findings are consistent with severe osteoarthritis.  It raises concern of inflammatory arthritis.  XR Hand 2 View Right  Result Date: 06/03/2020 Severe CMC narrowing and subluxation was noted.  No MCP narrowing was noted.  PIP and DIP narrowing was noted.  No intercarpal or radiocarpal joint space narrowing was noted.  Cystic versus erosive changes were noted in the DIP joints. Impression: These findings  are consistent with severe osteoarthritis and possible inflammatory arthritis.   Recent Labs: Lab Results  Component Value Date   NA 138 03/09/2020   K 3.2 (L) 03/09/2020   CL 98 03/09/2020   CO2 29 03/09/2020   GLUCOSE 85 03/09/2020   BUN 28 (H) 03/09/2020   CREATININE 0.77 03/09/2020   BILITOT 0.5 03/09/2020   ALKPHOS 73 03/09/2020   AST 14 03/09/2020   ALT 18 03/09/2020   PROT 6.7 03/09/2020   ALBUMIN 4.2 03/09/2020   CALCIUM 9.3 03/09/2020    Speciality Comments: No specialty comments available.  Procedures:  No procedures performed Allergies: Patient has no known allergies.   Assessment / Plan:     Visit Diagnoses: Pain in both hands - 05/11/19: ANA negative, CRP 1.2, ESR 22, RF<14, HLA-B27 negative -I do not see any synovitis on examination.  She has some osteoarthritic changes on my examination.  Detail concerning osteoarthritis per provided.  Joint protection muscle strengthening was discussed.  A hand out on exercises was given.  Plan: XR Hand 2 View Right, XR Hand 2 View Left.  X-rays were consistent with severe osteoarthritis.  Some cystic versus erosive changes were noted in the DIP joints which raises concern of psoriatic arthritis.  I will schedule ultrasound of bilateral hands to evaluate this further.  Primary osteoarthritis of both knees-she brought pictures of her knee joints x-ray on her cell phone.  She has left knee joint severe osteoarthritis which will require total knee replacement.  Right knee joint and moderate osteoarthritis.  She has been followed by orthopedics.    Pain in both feet -she complains of pain and discomfort in her bilateral feet.  She has bilateral pes cavus.  No synovitis was noted.  Plan: XR Foot 2 Views Right, XR Foot 2 Views Left.  X-rays were consistent with osteoarthritis.  She denies any history of Achilles tendinitis, plantar fasciitis.  Acquired bilateral pes cavus-she would benefit from arch support.  Hammertoes of both feet-she  would benefit from metatarsal pads.  Have advised her to schedule an appointment with a podiatrist.  Psoriasis-she was diagnosed with psoriasis when she was 61.  She has extensive psoriasis in her scalp and also on her trunk.  She would benefit from treatment of psoriasis.  She has a dermatologist.    Primary hypertension-her blood pressure is fairly well controlled today.  History of gastroesophageal reflux (GERD)-she is on medications.  Attention deficit hyperactivity disorder (ADHD), predominantly inattentive type  History of depression  Primary insomnia  Orders: Orders Placed This Encounter  Procedures  . XR Hand 2  View Right  . XR Hand 2 View Left  . XR Foot 2 Views Right  . XR Foot 2 Views Left   No orders of the defined types were placed in this encounter.    Follow-Up Instructions: Return for Osteoarthritis, psoriasis.   Shaili Deveshwar, MD  Note - This record has been created using Dragon software.  Chart creation errors have been sought, but may not always  have been located. Such creation errors do not reflect on  the standard of medical care.  

## 2020-05-25 MED FILL — OMEPRAZOLE 20 MG CAP: 20 | 30 days supply | Qty: 30 | Fill #6

## 2020-05-25 MED FILL — CHLORTHALIDONE 25 MG TABS: 25 | 30 days supply | Qty: 30 | Fill #4

## 2020-05-26 ENCOUNTER — Other Ambulatory Visit (HOSPITAL_BASED_OUTPATIENT_CLINIC_OR_DEPARTMENT_OTHER): Payer: Self-pay | Admitting: Medical

## 2020-05-26 ENCOUNTER — Ambulatory Visit (INDEPENDENT_AMBULATORY_CARE_PROVIDER_SITE_OTHER): Payer: No Typology Code available for payment source | Admitting: Medical

## 2020-05-26 ENCOUNTER — Other Ambulatory Visit: Payer: Self-pay

## 2020-05-26 VITALS — BP 127/73 | HR 94 | Temp 98.6°F | Resp 18 | Ht 64.0 in | Wt 218.0 lb

## 2020-05-26 DIAGNOSIS — L409 Psoriasis, unspecified: Secondary | ICD-10-CM | POA: Diagnosis not present

## 2020-05-26 DIAGNOSIS — Z1283 Encounter for screening for malignant neoplasm of skin: Secondary | ICD-10-CM

## 2020-05-26 DIAGNOSIS — L659 Nonscarring hair loss, unspecified: Secondary | ICD-10-CM

## 2020-05-26 DIAGNOSIS — R21 Rash and other nonspecific skin eruption: Secondary | ICD-10-CM | POA: Diagnosis not present

## 2020-05-26 DIAGNOSIS — F32A Depression, unspecified: Secondary | ICD-10-CM

## 2020-05-26 DIAGNOSIS — M25562 Pain in left knee: Secondary | ICD-10-CM

## 2020-05-26 DIAGNOSIS — G8929 Other chronic pain: Secondary | ICD-10-CM

## 2020-05-26 MED ORDER — PENNSAID 2 % EX SOLN: CUTANEOUS | 1 refills | Status: DC

## 2020-05-26 MED ORDER — KETOCONAZOLE 2 % EX SHAM: 1.0000 "application " | MEDICATED_SHAMPOO | CUTANEOUS | 1 refills | Status: DC

## 2020-05-26 MED ORDER — FLUCONAZOLE 150 MG PO TABS: ORAL_TABLET | ORAL | 0 refills | Status: DC

## 2020-05-26 MED FILL — KETOCONAZOLE 2% SHAMPOO: 2 | 30 days supply | Qty: 120 | Fill #0

## 2020-05-26 MED FILL — LISINOPRIL 40 MG TABS: 40 | 90 days supply | Qty: 90 | Fill #1

## 2020-05-26 MED FILL — FLUCONAZOLE 150 MG TABS: 150 | 28 days supply | Qty: 4 | Fill #0

## 2020-05-26 NOTE — Progress Notes (Signed)
Subjective:    Patient ID: [name], female    DOB: 04/03/1960, 60 y.o.   MRN: [id number]  HPI  Pt in for evaluation on skin issues. She states skin rash on her scalp. Pt has thick scaly yellow color to skin. Also has spots.rasised areas  on abdomen. Hx of this in past but recently a lot worse and loosing hair.    Pt has history of psoriasis.  Pt also has left knee pain. Pt thinks she needs knee replacement. As seen by ortho in past and failed conservative measures.States prior surgeon probably would require her to loose weight before surgery performed and she was told in past too young to replace knee.     Review of Systems  Constitutional: Negative for chills, fatigue and fever.  Respiratory: Negative for cough, chest tightness, shortness of breath and wheezing.   Cardiovascular: Negative for chest pain and palpitations.  Gastrointestinal: Negative for abdominal pain.  Genitourinary: Negative for difficulty urinating, dysuria, flank pain and frequency.  Musculoskeletal: Negative for back pain, gait problem, myalgias and neck stiffness.       Left knee pain.  Skin: Positive for rash.  Neurological: Negative for dizziness, speech difficulty, weakness, numbness and headaches.  Hematological: Negative for adenopathy. Does not bruise/bleed easily.  Psychiatric/Behavioral: Positive for dysphoric mood. Negative for behavioral problems and confusion. The patient is not nervous/anxious.        Report controlled with pristiq.    Past Medical History:  Diagnosis Date  . ADD (attention deficit disorder)   . Arthritis   . GERD (gastroesophageal reflux disease)   . Hypertension      Social History   Socioeconomic History  . Marital status: Divorced    Spouse name: Not on file  . Number of children: Not on file  . Years of education: Not on file  . Highest education level: Not on file  Occupational History  . Not on file  Tobacco Use  . Smoking status: Never Smoker  .  Smokeless tobacco: Never Used  Substance and Sexual Activity  . Alcohol use: No  . Drug use: No  . Sexual activity: Not on file  Other Topics Concern  . Not on file  Social History Narrative  . Not on file   Social Determinants of Health   Financial Resource Strain: Not on file  Food Insecurity: Not on file  Transportation Needs: Not on file  Physical Activity: Not on file  Stress: Not on file  Social Connections: Not on file  Intimate Partner Violence: Not on file    Past Surgical History:  Procedure Laterality Date  . CESAREAN SECTION    . TONSILLECTOMY      No family history on file.  No Known Allergies  Current Outpatient Medications on File Prior to Visit  Medication Sig Dispense Refill  . chlorthalidone (HYGROTON) 25 MG tablet Take 1 tablet (25 mg total) by mouth daily. 30 tablet 5  . desvenlafaxine (PRISTIQ) 50 MG 24 hr tablet Take 1 tablet (50 mg total) by mouth daily. 30 tablet 0  . lisdexamfetamine (VYVANSE) 50 MG capsule Take 1 capsule (50 mg total) by mouth daily. 30 capsule 0  . lisinopril (ZESTRIL) 40 MG tablet TAKE 1 TABLET (40 MG TOTAL) BY MOUTH DAILY. 90 tablet 1  . omeprazole (PRILOSEC) 20 MG capsule Take 1 capsule (20 mg total) by mouth daily. 30 capsule 6  . potassium chloride (KLOR-CON) 10 MEQ tablet 1 tab po Monday, Wednesday and Friday 36 tablet  3  . traZODone (DESYREL) 50 MG tablet Take 0.5-1 tablets (25-50 mg total) by mouth at bedtime as needed for sleep. 30 tablet 3  . clobetasol ointment (TEMOVATE) 0.05 % Apply 1 application topically 2 (two) times daily. (Patient not taking: No sig reported) 30 g 1   No current facility-administered medications on file prior to visit.    BP 127/73   Pulse 94   Temp 98.6 F (37 C) (Oral)   Resp 18   Ht 5\' 4"  (1.626 m)   Wt 218 lb (98.9 kg)   LMP 12/25/2012 (Approximate)   SpO2 100%   BMI 37.42 kg/m      Objective:   Physical Exam  .General- No acute distress. Pleasant patient. Neck- Full range  of motion, no jvd Lungs- Clear, even and unlabored. Heart- regular rate and rhythm. Neurologic- CNII- XII grossly intact. Skin- very thick flacky, white appearance to scalp with loss of hair. Mild papular rash on abdomen.        Assessment & Plan:  For history of scalp rash, hair loss, psoriasis and abdomen rash, I went ahead and place referral to dermatologist.  During the interim you can use ketoconazole 2% shampoo. At pt request sent in diflucan 150 mg po q day.  Also for chronic knee pain with possible need for knee replacement, I went ahead and place referral to orthopedist. Also pennsaid sent to pharmacy.  Depression controlled with pristiq.  Follow-up in 1 month or as needed

## 2020-05-26 NOTE — Patient Instructions (Addendum)
For history of scalp rash, hair loss, psoriasis and abdomen rash, I went ahead and place referral to dermatologist.  During the interim you can use ketoconazole 2% shampoo. At pt request sent in diflucan 150 mg po q day.  Also for chronic knee pain with possible need for knee replacement, I went ahead and place referral to orthopedist. Also pennsaid sent to pharmacy.  Depression controlled with prestiq.  Follow-up in 1 month or as needed

## 2020-05-27 ENCOUNTER — Ambulatory Visit: Payer: 59 | Admitting: Rheumatology

## 2020-05-28 ENCOUNTER — Encounter: Payer: Self-pay | Admitting: Medical

## 2020-05-28 ENCOUNTER — Telehealth: Payer: Self-pay

## 2020-05-28 ENCOUNTER — Other Ambulatory Visit (HOSPITAL_BASED_OUTPATIENT_CLINIC_OR_DEPARTMENT_OTHER): Payer: Self-pay | Admitting: Medical

## 2020-05-28 ENCOUNTER — Other Ambulatory Visit: Payer: Self-pay | Admitting: Medical

## 2020-05-28 MED ORDER — DESVENLAFAXINE SUCCINATE ER 50 MG PO TB24: 50.0000 mg | ORAL_TABLET | Freq: Every day | ORAL | 1 refills | Status: DC

## 2020-05-28 MED ORDER — LISDEXAMFETAMINE DIMESYLATE 50 MG PO CAPS: 50.0000 mg | ORAL_CAPSULE | Freq: Every day | ORAL | 0 refills | Status: DC

## 2020-05-28 MED FILL — DESVENLAFAXINE SUC ER 50 MG: 50 | 90 days supply | Qty: 90 | Fill #0

## 2020-05-28 MED FILL — VYVANSE 50 MG CAPSULE: 50 | 30 days supply | Qty: 30 | Fill #0

## 2020-05-28 NOTE — Telephone Encounter (Signed)
PA for Pennsaid 2% started today , waiting approval or denial .   KEY : [id number]

## 2020-05-28 NOTE — Telephone Encounter (Signed)
Requesting: Vyvanse 50MG  Contract: 02/23/2020 UDS: 02/23/2020  Last Visit: 05/26/2020 Next Visit: None Last Refill: 04/24/2020 #30 and 0RF  Please Advise

## 2020-05-28 NOTE — Telephone Encounter (Signed)
Rx vyvanse refill sent to pt pharmacy.

## 2020-06-01 ENCOUNTER — Other Ambulatory Visit (HOSPITAL_BASED_OUTPATIENT_CLINIC_OR_DEPARTMENT_OTHER): Payer: Self-pay | Admitting: Medical

## 2020-06-01 ENCOUNTER — Telehealth: Payer: Self-pay | Admitting: Medical

## 2020-06-01 MED ORDER — DICLOFENAC SODIUM 1 % EX GEL: 4.0000 g | Freq: Four times a day (QID) | CUTANEOUS | 2 refills | Status: DC

## 2020-06-01 MED FILL — DICLOFENAC SODIUM 1 % GEL: 1 | 13 days supply | Qty: 200 | Fill #0

## 2020-06-01 NOTE — Telephone Encounter (Signed)
PA for Pennsaid denied   Generic voltaren 1% and diclofenac 1.5% drops are covered by insurance

## 2020-06-01 NOTE — Telephone Encounter (Signed)
Rx voltaren gel sent to pt pharmacy since pennsaid denied.

## 2020-06-02 ENCOUNTER — Ambulatory Visit: Payer: No Typology Code available for payment source | Admitting: Orthopedic Surgery

## 2020-06-03 ENCOUNTER — Ambulatory Visit: Payer: No Typology Code available for payment source | Admitting: Rheumatology

## 2020-06-03 ENCOUNTER — Encounter: Payer: Self-pay | Admitting: Medical

## 2020-06-03 ENCOUNTER — Other Ambulatory Visit: Payer: Self-pay

## 2020-06-03 ENCOUNTER — Ambulatory Visit: Payer: Self-pay

## 2020-06-03 ENCOUNTER — Encounter: Payer: Self-pay | Admitting: Rheumatology

## 2020-06-03 ENCOUNTER — Telehealth: Payer: Self-pay | Admitting: Medical

## 2020-06-03 ENCOUNTER — Ambulatory Visit: Payer: 59 | Admitting: Rheumatology

## 2020-06-03 VITALS — BP 128/87 | HR 92 | Ht 63.0 in | Wt 218.8 lb

## 2020-06-03 DIAGNOSIS — M170 Bilateral primary osteoarthritis of knee: Secondary | ICD-10-CM | POA: Diagnosis not present

## 2020-06-03 DIAGNOSIS — M79642 Pain in left hand: Secondary | ICD-10-CM | POA: Diagnosis not present

## 2020-06-03 DIAGNOSIS — M25571 Pain in right ankle and joints of right foot: Secondary | ICD-10-CM

## 2020-06-03 DIAGNOSIS — M79671 Pain in right foot: Secondary | ICD-10-CM | POA: Diagnosis not present

## 2020-06-03 DIAGNOSIS — M216X1 Other acquired deformities of right foot: Secondary | ICD-10-CM

## 2020-06-03 DIAGNOSIS — F5101 Primary insomnia: Secondary | ICD-10-CM | POA: Insufficient documentation

## 2020-06-03 DIAGNOSIS — Z8659 Personal history of other mental and behavioral disorders: Secondary | ICD-10-CM

## 2020-06-03 DIAGNOSIS — M79672 Pain in left foot: Secondary | ICD-10-CM

## 2020-06-03 DIAGNOSIS — I10 Essential (primary) hypertension: Secondary | ICD-10-CM

## 2020-06-03 DIAGNOSIS — M79641 Pain in right hand: Secondary | ICD-10-CM | POA: Diagnosis not present

## 2020-06-03 DIAGNOSIS — L409 Psoriasis, unspecified: Secondary | ICD-10-CM

## 2020-06-03 DIAGNOSIS — Z8719 Personal history of other diseases of the digestive system: Secondary | ICD-10-CM | POA: Insufficient documentation

## 2020-06-03 DIAGNOSIS — M2042 Other hammer toe(s) (acquired), left foot: Secondary | ICD-10-CM

## 2020-06-03 DIAGNOSIS — F900 Attention-deficit hyperactivity disorder, predominantly inattentive type: Secondary | ICD-10-CM

## 2020-06-03 DIAGNOSIS — M2041 Other hammer toe(s) (acquired), right foot: Secondary | ICD-10-CM

## 2020-06-03 DIAGNOSIS — M216X2 Other acquired deformities of left foot: Secondary | ICD-10-CM

## 2020-06-03 NOTE — Patient Instructions (Addendum)
Journal for Nurse Practitioners, 15(4), 263-267. Retrieved December 31, 2017 from http://clinicalkey.com/nursing">  Knee Exercises Ask your health care provider which exercises are safe for you. Do exercises exactly as told by your health care provider and adjust them as directed. It is normal to feel mild stretching, pulling, tightness, or discomfort as you do these exercises. Stop right away if you feel sudden pain or your pain gets worse. Do not begin these exercises until told by your health care provider. Stretching and range-of-motion exercises These exercises warm up your muscles and joints and improve the movement and flexibility of your knee. These exercises also help to relieve pain and swelling. Knee extension, prone 1. Lie on your abdomen (prone position) on a bed. 2. Place your left / right knee just beyond the edge of the surface so your knee is not on the bed. You can put a towel under your left / right thigh just above your kneecap for comfort. 3. Relax your leg muscles and allow gravity to straighten your knee (extension). You should feel a stretch behind your left / right knee. 4. Hold this position for __________ seconds. 5. Scoot up so your knee is supported between repetitions. Repeat __________ times. Complete this exercise __________ times a day. Knee flexion, active 1. Lie on your back with both legs straight. If this causes back discomfort, bend your left / right knee so your foot is flat on the floor. 2. Slowly slide your left / right heel back toward your buttocks. Stop when you feel a gentle stretch in the front of your knee or thigh (flexion). 3. Hold this position for __________ seconds. 4. Slowly slide your left / right heel back to the starting position. Repeat __________ times. Complete this exercise __________ times a day.   Quadriceps stretch, prone 1. Lie on your abdomen on a firm surface, such as a bed or padded floor. 2. Bend your left / right knee and hold  your ankle. If you cannot reach your ankle or pant leg, loop a belt around your foot and grab the belt instead. 3. Gently pull your heel toward your buttocks. Your knee should not slide out to the side. You should feel a stretch in the front of your thigh and knee (quadriceps). 4. Hold this position for __________ seconds. Repeat __________ times. Complete this exercise __________ times a day.   Hamstring, supine 1. Lie on your back (supine position). 2. Loop a belt or towel over the ball of your left / right foot. The ball of your foot is on the walking surface, right under your toes. 3. Straighten your left / right knee and slowly pull on the belt to raise your leg until you feel a gentle stretch behind your knee (hamstring). ? Do not let your knee bend while you do this. ? Keep your other leg flat on the floor. 4. Hold this position for __________ seconds. Repeat __________ times. Complete this exercise __________ times a day. Strengthening exercises These exercises build strength and endurance in your knee. Endurance is the ability to use your muscles for a long time, even after they get tired. Quadriceps, isometric This exercise stretches the muscles in front of your thigh (quadriceps) without moving your knee joint (isometric). 1. Lie on your back with your left / right leg extended and your other knee bent. Put a rolled towel or small pillow under your knee if told by your health care provider. 2. Slowly tense the muscles in the front of your   left / right thigh. You should see your kneecap slide up toward your hip or see increased dimpling just above the knee. This motion will push the back of the knee toward the floor. 3. For __________ seconds, hold the muscle as tight as you can without increasing your pain. 4. Relax the muscles slowly and completely. Repeat __________ times. Complete this exercise __________ times a day.   Straight leg raises This exercise stretches the muscles in  front of your thigh (quadriceps) and the muscles that move your hips (hip flexors). 1. Lie on your back with your left / right leg extended and your other knee bent. 2. Tense the muscles in the front of your left / right thigh. You should see your kneecap slide up or see increased dimpling just above the knee. Your thigh may even shake a bit. 3. Keep these muscles tight as you raise your leg 4-6 inches (10-15 cm) off the floor. Do not let your knee bend. 4. Hold this position for __________ seconds. 5. Keep these muscles tense as you lower your leg. 6. Relax your muscles slowly and completely after each repetition. Repeat __________ times. Complete this exercise __________ times a day. Hamstring, isometric 1. Lie on your back on a firm surface. 2. Bend your left / right knee about __________ degrees. 3. Dig your left / right heel into the surface as if you are trying to pull it toward your buttocks. Tighten the muscles in the back of your thighs (hamstring) to "dig" as hard as you can without increasing any pain. 4. Hold this position for __________ seconds. 5. Release the tension gradually and allow your muscles to relax completely for __________ seconds after each repetition. Repeat __________ times. Complete this exercise __________ times a day. Hamstring curls If told by your health care provider, do this exercise while wearing ankle weights. Begin with __________ lb weights. Then increase the weight by 1 lb (0.5 kg) increments. Do not wear ankle weights that are more than __________ lb. 1. Lie on your abdomen with your legs straight. 2. Bend your left / right knee as far as you can without feeling pain. Keep your hips flat against the floor. 3. Hold this position for __________ seconds. 4. Slowly lower your leg to the starting position. Repeat __________ times. Complete this exercise __________ times a day.   Squats This exercise strengthens the muscles in front of your thigh and knee  (quadriceps). 1. Stand in front of a table, with your feet and knees pointing straight ahead. You may rest your hands on the table for balance but not for support. 2. Slowly bend your knees and lower your hips like you are going to sit in a chair. ? Keep your weight over your heels, not over your toes. ? Keep your lower legs upright so they are parallel with the table legs. ? Do not let your hips go lower than your knees. ? Do not bend lower than told by your health care provider. ? If your knee pain increases, do not bend as low. 3. Hold the squat position for __________ seconds. 4. Slowly push with your legs to return to standing. Do not use your hands to pull yourself to standing. Repeat __________ times. Complete this exercise __________ times a day. Wall slides This exercise strengthens the muscles in front of your thigh and knee (quadriceps). 1. Lean your back against a smooth wall or door, and walk your feet out 18-24 inches (46-61 cm) from it. 2.   Place your feet hip-width apart. 3. Slowly slide down the wall or door until your knees bend __________ degrees. Keep your knees over your heels, not over your toes. Keep your knees in line with your hips. 4. Hold this position for __________ seconds. Repeat __________ times. Complete this exercise __________ times a day.   Straight leg raises This exercise strengthens the muscles that rotate the leg at the hip and move it away from your body (hip abductors). 1. Lie on your side with your left / right leg in the top position. Lie so your head, shoulder, knee, and hip line up. You may bend your bottom knee to help you keep your balance. 2. Roll your hips slightly forward so your hips are stacked directly over each other and your left / right knee is facing forward. 3. Leading with your heel, lift your top leg 4-6 inches (10-15 cm). You should feel the muscles in your outer hip lifting. ? Do not let your foot drift forward. ? Do not let your  knee roll toward the ceiling. 4. Hold this position for __________ seconds. 5. Slowly return your leg to the starting position. 6. Let your muscles relax completely after each repetition. Repeat __________ times. Complete this exercise __________ times a day.   Straight leg raises This exercise stretches the muscles that move your hips away from the front of the pelvis (hip extensors). 1. Lie on your abdomen on a firm surface. You can put a pillow under your hips if that is more comfortable. 2. Tense the muscles in your buttocks and lift your left / right leg about 4-6 inches (10-15 cm). Keep your knee straight as you lift your leg. 3. Hold this position for __________ seconds. 4. Slowly lower your leg to the starting position. 5. Let your leg relax completely after each repetition. Repeat __________ times. Complete this exercise __________ times a day. This information is not intended to replace advice given to you by your health care provider. Make sure you discuss any questions you have with your health care provider. Document Revised: 01/01/2018 Document Reviewed: 01/01/2018 Elsevier Patient Education  2021 [employer] Hand Exercises Hand exercises can be helpful for almost anyone. These exercises can strengthen the hands, improve flexibility and movement, and increase blood flow to the hands. These results can make work and daily tasks easier. Hand exercises can be especially helpful for people who have joint pain from arthritis or have nerve damage from overuse (carpal tunnel syndrome). These exercises can also help people who have injured a hand. Exercises Most of these hand exercises are gentle stretching and motion exercises. It is usually safe to do them often throughout the day. Warming up your hands before exercise may help to reduce stiffness. You can do this with gentle massage or by placing your hands in warm water for 10-15 minutes. It is normal to feel some stretching,  pulling, tightness, or mild discomfort as you begin new exercises. This will gradually improve. Stop an exercise right away if you feel sudden, severe pain or your pain gets worse. Ask your health care provider which exercises are best for you. Knuckle bend or "claw" fist 1. Stand or sit with your arm, hand, and all five fingers pointed straight up. Make sure to keep your wrist straight during the exercise. 2. Gently bend your fingers down toward your palm until the tips of your fingers are touching the top of your palm. Keep your big knuckle straight and just bend the   small knuckles in your fingers. 3. Hold this position for __________ seconds. 4. Straighten (extend) your fingers back to the starting position. Repeat this exercise 5-10 times with each hand. Full finger fist 1. Stand or sit with your arm, hand, and all five fingers pointed straight up. Make sure to keep your wrist straight during the exercise. 2. Gently bend your fingers into your palm until the tips of your fingers are touching the middle of your palm. 3. Hold this position for __________ seconds. 4. Extend your fingers back to the starting position, stretching every joint fully. Repeat this exercise 5-10 times with each hand. Straight fist 1. Stand or sit with your arm, hand, and all five fingers pointed straight up. Make sure to keep your wrist straight during the exercise. 2. Gently bend your fingers at the big knuckle, where your fingers meet your hand, and the middle knuckle. Keep the knuckle at the tips of your fingers straight and try to touch the bottom of your palm. 3. Hold this position for __________ seconds. 4. Extend your fingers back to the starting position, stretching every joint fully. Repeat this exercise 5-10 times with each hand. Tabletop 1. Stand or sit with your arm, hand, and all five fingers pointed straight up. Make sure to keep your wrist straight during the exercise. 2. Gently bend your fingers at the  big knuckle, where your fingers meet your hand, as far down as you can while keeping the small knuckles in your fingers straight. Think of forming a tabletop with your fingers. 3. Hold this position for __________ seconds. 4. Extend your fingers back to the starting position, stretching every joint fully. Repeat this exercise 5-10 times with each hand. Finger spread 1. Place your hand flat on a table with your palm facing down. Make sure your wrist stays straight as you do this exercise. 2. Spread your fingers and thumb apart from each other as far as you can until you feel a gentle stretch. Hold this position for __________ seconds. 3. Bring your fingers and thumb tight together again. Hold this position for __________ seconds. Repeat this exercise 5-10 times with each hand. Making circles 1. Stand or sit with your arm, hand, and all five fingers pointed straight up. Make sure to keep your wrist straight during the exercise. 2. Make a circle by touching the tip of your thumb to the tip of your index finger. 3. Hold for __________ seconds. Then open your hand wide. 4. Repeat this motion with your thumb and each finger on your hand. Repeat this exercise 5-10 times with each hand. Thumb motion 1. Sit with your forearm resting on a table and your wrist straight. Your thumb should be facing up toward the ceiling. Keep your fingers relaxed as you move your thumb. 2. Lift your thumb up as high as you can toward the ceiling. Hold for __________ seconds. 3. Bend your thumb across your palm as far as you can, reaching the tip of your thumb for the small finger (pinkie) side of your palm. Hold for __________ seconds. Repeat this exercise 5-10 times with each hand. Grip strengthening 1. Hold a stress ball or other soft ball in the middle of your hand. 2. Slowly increase the pressure, squeezing the ball as much as you can without causing pain. Think of bringing the tips of your fingers into the middle of  your palm. All of your finger joints should bend when doing this exercise. 3. Hold your squeeze for __________ seconds,   then relax. Repeat this exercise 5-10 times with each hand.   Contact a health care provider if:  Your hand pain or discomfort gets much worse when you do an exercise.  Your hand pain or discomfort does not improve within 2 hours after you exercise. If you have any of these problems, stop doing these exercises right away. Do not do them again unless your health care provider says that you can. Get help right away if:  You develop sudden, severe hand pain or swelling. If this happens, stop doing these exercises right away. Do not do them again unless your health care provider says that you can. This information is not intended to replace advice given to you by your health care provider. Make sure you discuss any questions you have with your health care provider. Document Revised: 07/04/2018 Document Reviewed: 03/14/2018 Elsevier Patient Education  2021 [employer]  

## 2020-06-03 NOTE — Telephone Encounter (Signed)
Referral to podiatrist placed

## 2020-06-10 ENCOUNTER — Other Ambulatory Visit: Payer: Self-pay

## 2020-06-10 ENCOUNTER — Ambulatory Visit (INDEPENDENT_AMBULATORY_CARE_PROVIDER_SITE_OTHER): Payer: No Typology Code available for payment source | Admitting: Podiatry

## 2020-06-10 DIAGNOSIS — M7742 Metatarsalgia, left foot: Secondary | ICD-10-CM

## 2020-06-10 DIAGNOSIS — M779 Enthesopathy, unspecified: Secondary | ICD-10-CM | POA: Diagnosis not present

## 2020-06-10 DIAGNOSIS — M7741 Metatarsalgia, right foot: Secondary | ICD-10-CM

## 2020-06-10 DIAGNOSIS — M216X9 Other acquired deformities of unspecified foot: Secondary | ICD-10-CM | POA: Diagnosis not present

## 2020-06-10 DIAGNOSIS — M2041 Other hammer toe(s) (acquired), right foot: Secondary | ICD-10-CM | POA: Diagnosis not present

## 2020-06-10 DIAGNOSIS — M2042 Other hammer toe(s) (acquired), left foot: Secondary | ICD-10-CM

## 2020-06-11 ENCOUNTER — Other Ambulatory Visit (HOSPITAL_BASED_OUTPATIENT_CLINIC_OR_DEPARTMENT_OTHER): Payer: Self-pay | Admitting: Dermatology

## 2020-06-13 NOTE — Progress Notes (Signed)
Subjective:   Patient ID: Bryna Colander, female   DOB: 60 y.o.   MRN: [id number]   HPI 60 year old female presents the office today to discuss orthotics.  She does have arthritis and her rheumatologist thought that she would benefit from orthotics.  Also she has a painful fifth toe and with I discussed treatment options for that as well.  She has high arches.  No recent injury or falls.  She has no other concerns today.   Review of Systems  All other systems reviewed and are negative.  Past Medical History:  Diagnosis Date  . ADD (attention deficit disorder)   . Arthritis   . GERD (gastroesophageal reflux disease)   . Hypertension     Past Surgical History:  Procedure Laterality Date  . CESAREAN SECTION    . DILATION AND CURETTAGE OF UTERUS  1998  . TONSILLECTOMY       Current Outpatient Medications:  .  Acetaminophen (TYLENOL PO), Take by mouth as needed., Disp: , Rfl:  .  BIOTIN PO, Take 1 tablet by mouth daily., Disp: , Rfl:  .  chlorthalidone (HYGROTON) 25 MG tablet, Take 1 tablet (25 mg total) by mouth daily., Disp: 30 tablet, Rfl: 5 .  Cholecalciferol (VITAMIN D3) 125 MCG (5000 UT) CAPS, Take 5,000 Units by mouth daily., Disp: , Rfl:  .  clobetasol ointment (TEMOVATE) 0.05 %, Apply 1 application topically 2 (two) times daily. (Patient not taking: No sig reported), Disp: 30 g, Rfl: 1 .  desvenlafaxine (PRISTIQ) 50 MG 24 hr tablet, Take 1 tablet (50 mg total) by mouth daily., Disp: 90 tablet, Rfl: 1 .  fluconazole (DIFLUCAN) 150 MG tablet, 1 tab po weekly for 4 weeks., Disp: 4 tablet, Rfl: 0 .  IBUPROFEN PO, Take by mouth as needed., Disp: , Rfl:  .  ketoconazole (NIZORAL) 2 % shampoo, Apply 1 application topically 2 (two) times a week., Disp: 120 mL, Rfl: 1 .  lisdexamfetamine (VYVANSE) 50 MG capsule, Take 1 capsule (50 mg total) by mouth daily., Disp: 30 capsule, Rfl: 0 .  lisinopril (ZESTRIL) 40 MG tablet, TAKE 1 TABLET (40 MG TOTAL) BY MOUTH DAILY., Disp: 90 tablet, Rfl:  1 .  MAGNESIUM PO, Take by mouth daily., Disp: , Rfl:  .  melatonin 1 MG TABS tablet, Take 2 mg by mouth at bedtime., Disp: , Rfl:  .  Multiple Vitamins-Minerals (ZINC PO), Take 50 mg by mouth daily., Disp: , Rfl:  .  Omega-3 Fatty Acids (FISH OIL PO), Take 2,800 mg by mouth 2 (two) times daily., Disp: , Rfl:  .  omeprazole (PRILOSEC) 20 MG capsule, Take 1 capsule (20 mg total) by mouth daily., Disp: 30 capsule, Rfl: 6 .  potassium chloride (KLOR-CON) 10 MEQ tablet, 1 tab po Monday, Wednesday and Friday, Disp: 36 tablet, Rfl: 3 .  traZODone (DESYREL) 50 MG tablet, Take 0.5-1 tablets (25-50 mg total) by mouth at bedtime as needed for sleep., Disp: 30 tablet, Rfl: 3  No Known Allergies        Objective:  Physical Exam  General: AAO x3, NAD  Dermatological: Skin is warm, dry and supple bilateral.There are no open sores, no preulcerative lesions, no rash or signs of infection present.  Vascular: Dorsalis Pedis artery and Posterior Tibial artery pedal pulses are 2/4 bilateral with immedate capillary fill time.  There is no pain with calf compression, swelling, warmth, erythema.   Neruologic: Grossly intact via light touch bilateral.  Negative Tinel's sign.  Musculoskeletal: Cavus foot type is  present.  Subjectively she is getting discomfort submetatarsal area bilaterally.  Hammertoe contractures are present.  Muscular strength 5/5 in all groups tested bilateral.  Gait: Unassisted, Nonantalgic.       Assessment:   60 year old female with cavus foot type, metatarsalgia/capsulitis; hammertoe     Plan:  -Treatment options discussed including all alternatives, risks, and complications -Etiology of symptoms were discussed -Independent review the x-rays that she had previous. -She was measured for orthotics today. -We discussed fifth digit hammertoe repair surgically and we also discussed conservative options.  She is to be having likely knee replacement surgery soon.  We will consider  surgery in the future on the toes.  Vivi Barrack DPM

## 2020-06-14 ENCOUNTER — Ambulatory Visit (INDEPENDENT_AMBULATORY_CARE_PROVIDER_SITE_OTHER): Payer: No Typology Code available for payment source | Admitting: Orthopedic Surgery

## 2020-06-14 ENCOUNTER — Encounter: Payer: Self-pay | Admitting: Medical

## 2020-06-14 ENCOUNTER — Ambulatory Visit (INDEPENDENT_AMBULATORY_CARE_PROVIDER_SITE_OTHER): Payer: No Typology Code available for payment source

## 2020-06-14 ENCOUNTER — Other Ambulatory Visit: Payer: Self-pay

## 2020-06-14 VITALS — Ht 63.5 in | Wt 214.0 lb

## 2020-06-14 DIAGNOSIS — M1712 Unilateral primary osteoarthritis, left knee: Secondary | ICD-10-CM

## 2020-06-14 DIAGNOSIS — G8929 Other chronic pain: Secondary | ICD-10-CM | POA: Diagnosis not present

## 2020-06-14 DIAGNOSIS — M25562 Pain in left knee: Secondary | ICD-10-CM | POA: Diagnosis not present

## 2020-06-15 ENCOUNTER — Encounter: Payer: Self-pay | Admitting: Orthopedic Surgery

## 2020-06-15 NOTE — Progress Notes (Signed)
Office Visit Note   Patient: [name]           Date of Birth: 1960/05/15           MRN: [id number] Visit Date: 06/14/2020 Requested by: Esperanza Richters, PA-C [address] [place],  [place] [address] PCP: Marisue Brooklyn  Subjective: Chief Complaint  Patient presents with  . 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  She works 12-hour shifts.  Has past medical history significant for hypertension osteoarthritis and psoriasis.  She is not taking any Biologics at this time.  No personal or family history of DVT or pulmonary embolism.  She does have a son and daughter and ex-husband in town.  She reports primarily medial sided pain.  She has had cortisone and gel injections which have not helped.  Last cortisone injection was in December.  She takes anti-inflammatories with some relief.  Her knee pain is debilitating.  She has night pain rest pain as well as pain with all activities of daily living.  She uses Voltaren gel with minimal relief.  Describes locking popping stiffness swelling weakness and giving way.  Her walking and standing endurance is markedly diminished and has been getting worse over the past 8 months.              ROS: All systems reviewed are negative as they relate to the chief complaint within the history of present illness.  Patient denies  fevers or chills.   Assessment & Plan: Visit Diagnoses:  1. Chronic pain of left knee   2. Arthritis of left knee     Plan: Impression is end-stage left knee arthritis in a 60 year old patient with some other medical conditions which are mildly concerning.  She is not on any Biologics for psoriasis at this time.  Her pain is severe and unrelenting and daily.  Even though she is young and her body mass index is elevated I think it is a reasonable consideration for left total knee replacement particularly press-fit variety.  Risk benefits are discussed  with [name] including not limited to infection nerve vessel damage knee stiffness incomplete pain relief as well as the chance for revision in her lifetime.  Nonetheless based on the amount of daily disability that she reports I think this is really her only option to have less pain in the left knee.  Plan for total knee replacement in the near future.  All questions answered.  Follow-Up Instructions: No follow-ups on file.   Orders:  Orders Placed This Encounter  Procedures  . XR KNEE 3 VIEW LEFT   No orders of the defined types were placed in this encounter.     Procedures: No procedures performed   Clinical Data: No additional findings.  Objective: Vital Signs: Ht 5' 3.5" (1.613 m)   Wt 214 lb (97.1 kg)   LMP 12/25/2012 (Approximate)   BMI 37.31 kg/m   Physical Exam:   Constitutional: Patient appears well-developed HEENT:  Head: Normocephalic Eyes:EOM are normal Neck: Normal range of motion Cardiovascular: Normal rate Pulmonary/chest: Effort normal Neurologic: Patient is alert Skin: Skin is warm Psychiatric: Patient has normal mood and affect    Ortho Exam: Ortho exam demonstrates left knee range of motion from 5-1 05.  Collateral crucial ligaments are stable.  Slight varus alignment is present.  Pedal pulses are palpable.  Ankle dorsiflexion intact.  No groin pain on the left  with internal ex rotation of the leg.  Skin is intact in the left knee region.  Specialty Comments:  No specialty comments available.  Imaging: XR KNEE 3 VIEW LEFT  Result Date: 06/15/2020 AP lateral merchant left knee radiographs reviewed.  End-stage tricompartmental arthritis is present markedly worse in the medial compartment.  Varus alignment also present.  No acute fracture.    PMFS History: Patient Active Problem List   Diagnosis Date Noted  . Psoriasis 06/03/2020  . History of gastroesophageal reflux (GERD) 06/03/2020  . Attention deficit hyperactivity disorder (ADHD),  predominantly inattentive type 06/03/2020  . History of depression 06/03/2020  . Primary insomnia 06/03/2020  . HTN (hypertension) 12/25/2017   Past Medical History:  Diagnosis Date  . ADD (attention deficit disorder)   . Arthritis   . GERD (gastroesophageal reflux disease)   . Hypertension     Family History  Problem Relation Age of Onset  . Cancer Mother   . Mental illness Father   . Alcoholism Father   . Mental illness Sister   . Suicidality Sister   . Healthy Son   . Healthy Son   . Healthy Daughter     Past Surgical History:  Procedure Laterality Date  . CESAREAN SECTION    . DILATION AND CURETTAGE OF UTERUS  1998  . TONSILLECTOMY     Social History   Occupational History  . Not on file  Tobacco Use  . Smoking status: Never Smoker  . Smokeless tobacco: Never Used  Vaping Use  . Vaping Use: Never used  Substance and Sexual Activity  . Alcohol use: Yes    Comment: social  . Drug use: No  . Sexual activity: Not on file

## 2020-06-19 NOTE — Progress Notes (Deleted)
Office Visit Note  Patient: [name]             Date of Birth: May 15, 1960           MRN: [id number]             PCP: Elise Benne Referring: Elise Benne Visit Date: 07/01/2020 Occupation: @GUAROCC @  Subjective:  No chief complaint on file.   History of Present Illness: Janita Camberos is a 60 y.o. female ***   Activities of Daily Living:  Patient reports morning stiffness for *** {minute/hour:19697}.   Patient {ACTIONS;DENIES/REPORTS:21021675::"Denies"} nocturnal pain.  Difficulty dressing/grooming: {ACTIONS;DENIES/REPORTS:21021675::"Denies"} Difficulty climbing stairs: {ACTIONS;DENIES/REPORTS:21021675::"Denies"} Difficulty getting out of chair: {ACTIONS;DENIES/REPORTS:21021675::"Denies"} Difficulty using hands for taps, buttons, cutlery, and/or writing: {ACTIONS;DENIES/REPORTS:21021675::"Denies"}  No Rheumatology ROS completed.   PMFS History:  Patient Active Problem List   Diagnosis Date Noted  . Psoriasis 06/03/2020  . History of gastroesophageal reflux (GERD) 06/03/2020  . Attention deficit hyperactivity disorder (ADHD), predominantly inattentive type 06/03/2020  . History of depression 06/03/2020  . Primary insomnia 06/03/2020  . HTN (hypertension) 12/25/2017    Past Medical History:  Diagnosis Date  . ADD (attention deficit disorder)   . Arthritis   . GERD (gastroesophageal reflux disease)   . Hypertension     Family History  Problem Relation Age of Onset  . Cancer Mother   . Mental illness Father   . Alcoholism Father   . Mental illness Sister   . Suicidality Sister   . Healthy Son   . Healthy Son   . Healthy Daughter    Past Surgical History:  Procedure Laterality Date  . CESAREAN SECTION    . DILATION AND CURETTAGE OF UTERUS  1998  . TONSILLECTOMY     Social History   Social History Narrative  . Not on file   Immunization History  Administered Date(s) Administered  . Janssen (J&J) SARS-COV-2 Vaccination 11/22/2019      Objective: Vital Signs: LMP 12/25/2012 (Approximate)    Physical Exam   Musculoskeletal Exam: ***  CDAI Exam: CDAI Score: -- Patient Global: --; Provider Global: -- Swollen: --; Tender: -- Joint Exam 07/01/2020   No joint exam has been documented for this visit   There is currently no information documented on the homunculus. Go to the Rheumatology activity and complete the homunculus joint exam.  Investigation: No additional findings.  Imaging: XR Foot 2 Views Left  Result Date: 06/03/2020 First MTP, PIP and DIP narrowing was noted.  No intertarsal, tibiotalar or subtalar joint space narrowing was noted.  Dorsal spurring and posterior calcaneal spurring was noted. Impression: These findings are consistent with osteoarthritis of the foot.  XR Foot 2 Views Right  Result Date: 06/03/2020 First MTP, PIP and DIP narrowing was noted.  No intertarsal, tibiotalar or subtalar joint space narrowing was noted.  Dorsal spurring and posterior calcaneal spurring was noted. Impression: These findings are consistent with osteoarthritis of the foot.  XR Hand 2 View Left  Result Date: 06/03/2020 Severe CMC narrowing and subluxation was noted.  No MCP narrowing was noted.  PIP and DIP narrowing was noted.  Subluxation of third PIP joint was noted.  No intercarpal or radiocarpal joint space narrowing was noted.  Cystic/erovise changes were noted in the DIP joints. Impression: These findings are consistent with severe osteoarthritis.  It raises concern of inflammatory arthritis.  XR Hand 2 View Right  Result Date: 06/03/2020 Severe CMC narrowing and subluxation was noted.  No MCP narrowing was noted.  PIP and  DIP narrowing was noted.  No intercarpal or radiocarpal joint space narrowing was noted.  Cystic versus erosive changes were noted in the DIP joints. Impression: These findings are consistent with severe osteoarthritis and possible inflammatory arthritis.  XR KNEE 3 VIEW LEFT  Result  Date: 06/15/2020 AP lateral merchant left knee radiographs reviewed.  End-stage tricompartmental arthritis is present markedly worse in the medial compartment.  Varus alignment also present.  No acute fracture.   Recent Labs: Lab Results  Component Value Date   NA 138 03/09/2020   K 3.2 (L) 03/09/2020   CL 98 03/09/2020   CO2 29 03/09/2020   GLUCOSE 85 03/09/2020   BUN 28 (H) 03/09/2020   CREATININE 0.77 03/09/2020   BILITOT 0.5 03/09/2020   ALKPHOS 73 03/09/2020   AST 14 03/09/2020   ALT 18 03/09/2020   PROT 6.7 03/09/2020   ALBUMIN 4.2 03/09/2020   CALCIUM 9.3 03/09/2020   March 09, 2020 RF negative, ANA negative, HLA-B27 negative, sed rate 32, CRP 1.2   Speciality Comments: No specialty comments available.  Procedures:  No procedures performed Allergies: Patient has no known allergies.   Assessment / Plan:     Visit Diagnoses: No diagnosis found.  Orders: No orders of the defined types were placed in this encounter.  No orders of the defined types were placed in this encounter.   Face-to-face time spent with patient was *** minutes. Greater than 50% of time was spent in counseling and coordination of care.  Follow-Up Instructions: No follow-ups on file.   Bo Merino, MD  Note - This record has been created using Editor, commissioning.  Chart creation errors have been sought, but may not always  have been located. Such creation errors do not reflect on  the standard of medical care.

## 2020-06-24 MED FILL — POTASSIUM CL ER 10 MEQ TAB: 10 | 90 days supply | Qty: 36 | Fill #1

## 2020-06-24 MED FILL — OMEPRAZOLE 20 MG CAP: 20 | 30 days supply | Qty: 30 | Fill #1

## 2020-06-24 MED FILL — traZODone HCL 50 MG TABS: 50 | 30 days supply | Qty: 30 | Fill #3

## 2020-06-24 MED FILL — CHLORTHALIDONE 25 MG TABS: 25 | 30 days supply | Qty: 30 | Fill #5

## 2020-06-27 ENCOUNTER — Other Ambulatory Visit: Payer: Self-pay | Admitting: Medical

## 2020-06-28 ENCOUNTER — Encounter: Payer: Self-pay | Admitting: Medical

## 2020-06-28 ENCOUNTER — Other Ambulatory Visit (HOSPITAL_BASED_OUTPATIENT_CLINIC_OR_DEPARTMENT_OTHER): Payer: Self-pay

## 2020-06-28 MED ORDER — LISDEXAMFETAMINE DIMESYLATE 50 MG PO CAPS
50.0000 mg | ORAL_CAPSULE | Freq: Every day | ORAL | 0 refills | Status: DC
  Filled 2020-06-28: qty 30, 30d supply, fill #0

## 2020-06-28 NOTE — Telephone Encounter (Signed)
Requesting: Vyvanse 50mg  Contract: 02/23/2020 UDS: 02/23/2020 Last Visit: 05/26/2020 Next Visit: None Last Refill: 05/28/2020 #30 and 0RF  Please Advise

## 2020-06-28 NOTE — Telephone Encounter (Signed)
Refilled pt vyvanse today.

## 2020-06-30 ENCOUNTER — Other Ambulatory Visit (HOSPITAL_BASED_OUTPATIENT_CLINIC_OR_DEPARTMENT_OTHER): Payer: Self-pay

## 2020-06-30 ENCOUNTER — Other Ambulatory Visit (HOSPITAL_COMMUNITY): Payer: Self-pay

## 2020-06-30 MED ORDER — AMOXICILLIN-POT CLAVULANATE 875-125 MG PO TABS
ORAL_TABLET | ORAL | 0 refills | Status: DC
  Filled 2020-06-30: qty 20, 10d supply, fill #0

## 2020-06-30 MED ORDER — HYDROCODONE-ACETAMINOPHEN 5-325 MG PO TABS
ORAL_TABLET | ORAL | 0 refills | Status: DC
  Filled 2020-06-30: qty 12, 2d supply, fill #0

## 2020-06-30 MED ORDER — IBUPROFEN 800 MG PO TABS
ORAL_TABLET | ORAL | 0 refills | Status: DC
  Filled 2020-06-30: qty 30, 10d supply, fill #0

## 2020-06-30 MED ORDER — CHLORHEXIDINE GLUCONATE 0.12 % MT SOLN
OROMUCOSAL | 0 refills | Status: DC
  Filled 2020-06-30: qty 473, 16d supply, fill #0

## 2020-06-30 MED ORDER — OTEZLA 30 MG PO TABS
1.0000 | ORAL_TABLET | Freq: Two times a day (BID) | ORAL | 3 refills | Status: DC
  Filled 2020-06-30: qty 60, 30d supply, fill #0

## 2020-07-01 ENCOUNTER — Other Ambulatory Visit (HOSPITAL_COMMUNITY): Payer: Self-pay

## 2020-07-01 ENCOUNTER — Ambulatory Visit: Payer: Self-pay | Admitting: Rheumatology

## 2020-07-01 ENCOUNTER — Telehealth: Payer: Self-pay | Admitting: Pharmacist

## 2020-07-01 ENCOUNTER — Other Ambulatory Visit: Payer: Self-pay

## 2020-07-01 ENCOUNTER — Ambulatory Visit: Payer: 59 | Admitting: Rheumatology

## 2020-07-01 ENCOUNTER — Ambulatory Visit: Payer: No Typology Code available for payment source | Attending: Medical | Admitting: Pharmacist

## 2020-07-01 DIAGNOSIS — Z79899 Other long term (current) drug therapy: Secondary | ICD-10-CM

## 2020-07-01 DIAGNOSIS — Z8659 Personal history of other mental and behavioral disorders: Secondary | ICD-10-CM

## 2020-07-01 DIAGNOSIS — I10 Essential (primary) hypertension: Secondary | ICD-10-CM

## 2020-07-01 DIAGNOSIS — Z8719 Personal history of other diseases of the digestive system: Secondary | ICD-10-CM

## 2020-07-01 DIAGNOSIS — M216X1 Other acquired deformities of right foot: Secondary | ICD-10-CM

## 2020-07-01 DIAGNOSIS — M19071 Primary osteoarthritis, right ankle and foot: Secondary | ICD-10-CM

## 2020-07-01 DIAGNOSIS — F900 Attention-deficit hyperactivity disorder, predominantly inattentive type: Secondary | ICD-10-CM

## 2020-07-01 DIAGNOSIS — L409 Psoriasis, unspecified: Secondary | ICD-10-CM

## 2020-07-01 DIAGNOSIS — M19041 Primary osteoarthritis, right hand: Secondary | ICD-10-CM

## 2020-07-01 DIAGNOSIS — M2041 Other hammer toe(s) (acquired), right foot: Secondary | ICD-10-CM

## 2020-07-01 DIAGNOSIS — F5101 Primary insomnia: Secondary | ICD-10-CM

## 2020-07-01 DIAGNOSIS — M170 Bilateral primary osteoarthritis of knee: Secondary | ICD-10-CM

## 2020-07-01 MED ORDER — OTEZLA 30 MG PO TABS
30.0000 mg | ORAL_TABLET | Freq: Two times a day (BID) | ORAL | 3 refills | Status: DC
  Filled 2020-07-01 (×2): qty 60, 30d supply, fill #0
  Filled 2020-07-26: qty 60, 30d supply, fill #1
  Filled 2020-08-20: qty 60, 30d supply, fill #2
  Filled 2020-09-20: qty 60, 30d supply, fill #3

## 2020-07-01 NOTE — Telephone Encounter (Signed)
Called patient to schedule an appointment for the  [employer] [facility]. I was unable to reach the patient so I left a HIPAA-compliant message requesting that the patient return my call.   Luke Van Ausdall, PharmD, BCACP, CPP Clinical Pharmacist [facility] [phone]  

## 2020-07-01 NOTE — Progress Notes (Signed)
  S: Patient presents for review of their specialty medication therapy.  Patient is currently taking Otezla for psoriasis. Patient is managed by Dr. Melida Quitter for this.   Adherence: confirms. She was provided with a sample starter pack and has progressed to the 30mg  BID dose.   Efficacy: reports that she is very happy with the results. Endorses ~90% improvement since starting .  Dosing:  Active psoriatic arthritis or plaque psoriasis (moderate to severe): Oral: Initial: 10 mg in the morning. Titrate upward by additional 10 mg per day on days 2 to 5 as follows: Day 2: 10 mg twice daily; Day 3: 10 mg in the morning and 20 mg in the evening; Day 4: 20 mg twice daily; Day 5: 20 mg in the morning and 30 mg in the evening. Maintenance dose: 30 mg twice daily starting on day 6  CrCl <30 mL/minute: Initial: 10 mg in the morning on days 1 to 3; titrate using morning doses only (skip evening doses) to 20 mg on days 4 and 5. Maintenance dose: 30 mg once daily in the morning starting on day 6.   Current adverse effects: Headache: none  GI upset: none  Weight loss: none  Neuropsychiatric effects: none   O:  No results found for: WBC, HGB, HCT, MCV, PLT    Chemistry      Component Value Date/Time   NA 138 03/09/2020 1517   K 3.2 (L) 03/09/2020 1517   CL 98 03/09/2020 1517   CO2 29 03/09/2020 1517   BUN 28 (H) 03/09/2020 1517   CREATININE 0.77 03/09/2020 1517      Component Value Date/Time   CALCIUM 9.3 03/09/2020 1517   ALKPHOS 73 03/09/2020 1517   AST 14 03/09/2020 1517   ALT 18 03/09/2020 1517   BILITOT 0.5 03/09/2020 1517       A/P: 1. Medication review: patient is currently on Otezla for psoriasis and is tolerating it well. Reviewed the medication including the following: apremilast inhibits phosphodiesterase 4 (PDE4) specific for cyclic adenosine monophosphate (cAMP) which results in increased intracellular cAMP levels and regulation of numerous inflammatory mediators (eg,  decreased expression of nitric oxide synthase, TNF-alpha, and interleukin [IL]-23, as well as increased IL-10. Patient educated on purpose, proper use and potential adverse effects of Otezla. Possible adverse effects include weight loss, GI upset, headache, and mood changes. Renal function should be routinely monitored. Administer without regard to food. Do not crush, chew, or split tablets. No recommendations for any changes at this time.  03/11/2020, PharmD, Butch Penny, CPP Clinical Pharmacist [facility] [phone]

## 2020-07-05 ENCOUNTER — Telehealth: Payer: Self-pay | Admitting: Orthopedic Surgery

## 2020-07-05 NOTE — Telephone Encounter (Signed)
[employer] forms received. Sent to [employer]. 

## 2020-07-08 ENCOUNTER — Encounter: Payer: Self-pay | Admitting: Orthopedic Surgery

## 2020-07-08 ENCOUNTER — Telehealth: Payer: Self-pay

## 2020-07-08 NOTE — Telephone Encounter (Signed)
Pt has a few questions regarding her surgery and would like a call to discuss.  Please advise.

## 2020-07-12 NOTE — Telephone Encounter (Signed)
Sent patient an e-mail today asking if she would like to secure a date in the fall for her left total knee   Still waiting for a response.

## 2020-07-21 ENCOUNTER — Other Ambulatory Visit (HOSPITAL_COMMUNITY): Payer: Self-pay

## 2020-07-23 ENCOUNTER — Telehealth: Payer: Self-pay | Admitting: Medical

## 2020-07-23 ENCOUNTER — Other Ambulatory Visit (HOSPITAL_COMMUNITY): Payer: No Typology Code available for payment source

## 2020-07-23 MED FILL — Clobetasol Propionate Soln 0.05%: CUTANEOUS | 30 days supply | Qty: 50 | Fill #0 | Status: CN

## 2020-07-26 ENCOUNTER — Telehealth: Payer: Self-pay | Admitting: Medical

## 2020-07-26 ENCOUNTER — Other Ambulatory Visit (HOSPITAL_BASED_OUTPATIENT_CLINIC_OR_DEPARTMENT_OTHER): Payer: Self-pay

## 2020-07-26 ENCOUNTER — Other Ambulatory Visit (HOSPITAL_COMMUNITY): Payer: Self-pay

## 2020-07-26 MED ORDER — LISDEXAMFETAMINE DIMESYLATE 50 MG PO CAPS
50.0000 mg | ORAL_CAPSULE | Freq: Every day | ORAL | 0 refills | Status: DC
  Filled 2020-07-26 – 2020-08-05 (×2): qty 30, 30d supply, fill #0

## 2020-07-26 NOTE — Telephone Encounter (Signed)
Requesting: vyvanse Contract:02/25/2020 UDS:01/2020 Last Visit:05/2020 Next Visit:n/a Last Refill:06/28/2020  Please Advise

## 2020-07-26 NOTE — Telephone Encounter (Signed)
Opened to review 

## 2020-07-26 NOTE — Addendum Note (Signed)
Addended by: Gwenevere Abbot on: 07/26/2020 01:10 PM   Modules accepted: Orders

## 2020-07-26 NOTE — Telephone Encounter (Signed)
Rx vyvanse sent to pt pharmacy. 

## 2020-07-27 ENCOUNTER — Other Ambulatory Visit (HOSPITAL_COMMUNITY): Payer: Self-pay

## 2020-07-27 ENCOUNTER — Other Ambulatory Visit (HOSPITAL_BASED_OUTPATIENT_CLINIC_OR_DEPARTMENT_OTHER): Payer: Self-pay

## 2020-07-27 ENCOUNTER — Ambulatory Visit: Admit: 2020-07-27 | Payer: No Typology Code available for payment source | Admitting: Orthopedic Surgery

## 2020-07-27 SURGERY — ARTHROPLASTY, KNEE, TOTAL
Anesthesia: Spinal | Site: Knee | Laterality: Left

## 2020-08-02 ENCOUNTER — Other Ambulatory Visit (HOSPITAL_BASED_OUTPATIENT_CLINIC_OR_DEPARTMENT_OTHER): Payer: Self-pay

## 2020-08-03 ENCOUNTER — Other Ambulatory Visit (HOSPITAL_BASED_OUTPATIENT_CLINIC_OR_DEPARTMENT_OTHER): Payer: Self-pay

## 2020-08-05 ENCOUNTER — Other Ambulatory Visit (HOSPITAL_BASED_OUTPATIENT_CLINIC_OR_DEPARTMENT_OTHER): Payer: Self-pay

## 2020-08-05 ENCOUNTER — Other Ambulatory Visit: Payer: Self-pay | Admitting: Medical

## 2020-08-05 MED ORDER — OMEPRAZOLE 20 MG PO CPDR
DELAYED_RELEASE_CAPSULE | Freq: Every day | ORAL | 6 refills | Status: DC
  Filled 2020-08-05: qty 30, 30d supply, fill #0
  Filled 2020-09-03: qty 30, 30d supply, fill #1
  Filled 2020-10-01: qty 30, 30d supply, fill #2
  Filled 2020-10-27: qty 30, 30d supply, fill #3
  Filled 2020-11-24: qty 30, 30d supply, fill #4
  Filled 2020-12-22: qty 30, 30d supply, fill #5
  Filled 2021-01-29: qty 30, 30d supply, fill #6

## 2020-08-05 MED ORDER — CHLORTHALIDONE 25 MG PO TABS
ORAL_TABLET | Freq: Every day | ORAL | 5 refills | Status: DC
  Filled 2020-08-05: qty 30, 30d supply, fill #0
  Filled 2020-09-03: qty 30, 30d supply, fill #1
  Filled 2020-10-01: qty 30, 30d supply, fill #2
  Filled 2020-10-27: qty 30, 30d supply, fill #3
  Filled 2020-11-24: qty 30, 30d supply, fill #4
  Filled 2020-12-22: qty 30, 30d supply, fill #5

## 2020-08-05 MED FILL — Clobetasol Propionate Soln 0.05%: CUTANEOUS | 30 days supply | Qty: 50 | Fill #0 | Status: AC

## 2020-08-11 ENCOUNTER — Encounter: Payer: No Typology Code available for payment source | Admitting: Orthopedic Surgery

## 2020-08-20 ENCOUNTER — Other Ambulatory Visit (HOSPITAL_COMMUNITY): Payer: Self-pay

## 2020-09-03 ENCOUNTER — Other Ambulatory Visit (HOSPITAL_BASED_OUTPATIENT_CLINIC_OR_DEPARTMENT_OTHER): Payer: Self-pay

## 2020-09-03 ENCOUNTER — Other Ambulatory Visit: Payer: Self-pay | Admitting: Medical

## 2020-09-03 MED FILL — Desvenlafaxine Succinate Tab ER 24HR 50 MG (Base Equiv): ORAL | 90 days supply | Qty: 90 | Fill #0 | Status: AC

## 2020-09-06 ENCOUNTER — Other Ambulatory Visit (HOSPITAL_BASED_OUTPATIENT_CLINIC_OR_DEPARTMENT_OTHER): Payer: Self-pay

## 2020-09-06 ENCOUNTER — Telehealth: Payer: Self-pay | Admitting: Medical

## 2020-09-06 MED ORDER — LISDEXAMFETAMINE DIMESYLATE 50 MG PO CAPS
50.0000 mg | ORAL_CAPSULE | Freq: Every day | ORAL | 0 refills | Status: DC
  Filled 2020-09-06: qty 30, 30d supply, fill #0

## 2020-09-06 MED ORDER — LISINOPRIL 40 MG PO TABS
ORAL_TABLET | Freq: Every day | ORAL | 1 refills | Status: DC
  Filled 2020-09-06: qty 90, 90d supply, fill #0

## 2020-09-06 NOTE — Telephone Encounter (Signed)
Pt called and lvm to return call 

## 2020-09-06 NOTE — Telephone Encounter (Signed)
Pt needs to schedule follow up early July for controlled med visit. Before runs out of this rx sent in today. Go ahead and get her scheduled.

## 2020-09-06 NOTE — Telephone Encounter (Signed)
Pt scheduled for 6/24

## 2020-09-13 ENCOUNTER — Other Ambulatory Visit (HOSPITAL_BASED_OUTPATIENT_CLINIC_OR_DEPARTMENT_OTHER): Payer: Self-pay

## 2020-09-15 ENCOUNTER — Other Ambulatory Visit (HOSPITAL_COMMUNITY): Payer: Self-pay

## 2020-09-17 ENCOUNTER — Encounter: Payer: No Typology Code available for payment source | Admitting: Medical

## 2020-09-20 ENCOUNTER — Other Ambulatory Visit (HOSPITAL_COMMUNITY): Payer: Self-pay

## 2020-09-22 ENCOUNTER — Other Ambulatory Visit (HOSPITAL_COMMUNITY): Payer: Self-pay

## 2020-09-29 ENCOUNTER — Other Ambulatory Visit (HOSPITAL_BASED_OUTPATIENT_CLINIC_OR_DEPARTMENT_OTHER): Payer: Self-pay

## 2020-09-29 ENCOUNTER — Ambulatory Visit (INDEPENDENT_AMBULATORY_CARE_PROVIDER_SITE_OTHER): Payer: No Typology Code available for payment source | Admitting: Medical

## 2020-09-29 ENCOUNTER — Other Ambulatory Visit: Payer: Self-pay

## 2020-09-29 ENCOUNTER — Encounter: Payer: Self-pay | Admitting: Medical

## 2020-09-29 VITALS — BP 115/82 | HR 107 | Temp 98.2°F | Resp 20 | Ht 63.0 in | Wt 216.4 lb

## 2020-09-29 DIAGNOSIS — F900 Attention-deficit hyperactivity disorder, predominantly inattentive type: Secondary | ICD-10-CM | POA: Diagnosis not present

## 2020-09-29 DIAGNOSIS — F32A Depression, unspecified: Secondary | ICD-10-CM

## 2020-09-29 DIAGNOSIS — I10 Essential (primary) hypertension: Secondary | ICD-10-CM | POA: Diagnosis not present

## 2020-09-29 DIAGNOSIS — S0300XA Dislocation of jaw, unspecified side, initial encounter: Secondary | ICD-10-CM

## 2020-09-29 DIAGNOSIS — Z124 Encounter for screening for malignant neoplasm of cervix: Secondary | ICD-10-CM

## 2020-09-29 DIAGNOSIS — R5383 Other fatigue: Secondary | ICD-10-CM | POA: Diagnosis not present

## 2020-09-29 DIAGNOSIS — Z79899 Other long term (current) drug therapy: Secondary | ICD-10-CM

## 2020-09-29 DIAGNOSIS — L409 Psoriasis, unspecified: Secondary | ICD-10-CM

## 2020-09-29 DIAGNOSIS — G2581 Restless legs syndrome: Secondary | ICD-10-CM

## 2020-09-29 DIAGNOSIS — Z0000 Encounter for general adult medical examination without abnormal findings: Secondary | ICD-10-CM | POA: Diagnosis not present

## 2020-09-29 DIAGNOSIS — Z1211 Encounter for screening for malignant neoplasm of colon: Secondary | ICD-10-CM

## 2020-09-29 DIAGNOSIS — G4700 Insomnia, unspecified: Secondary | ICD-10-CM

## 2020-09-29 LAB — T4, FREE: Free T4: 1.63 ng/dL — ABNORMAL HIGH (ref 0.60–1.60)

## 2020-09-29 LAB — TSH: TSH: 0.93 u[IU]/mL (ref 0.35–5.50)

## 2020-09-29 MED ORDER — SHINGRIX 50 MCG/0.5ML IM SUSR
INTRAMUSCULAR | 0 refills | Status: DC
  Filled 2020-09-29: qty 1, 1d supply, fill #0

## 2020-09-29 MED ORDER — OLMESARTAN MEDOXOMIL 20 MG PO TABS
20.0000 mg | ORAL_TABLET | Freq: Every day | ORAL | 3 refills | Status: DC
  Filled 2020-09-29: qty 30, 30d supply, fill #0
  Filled 2020-10-27: qty 30, 30d supply, fill #1
  Filled 2020-11-24: qty 30, 30d supply, fill #2
  Filled 2020-12-22: qty 30, 30d supply, fill #3

## 2020-09-29 MED ORDER — GABAPENTIN 100 MG PO CAPS
100.0000 mg | ORAL_CAPSULE | Freq: Every day | ORAL | 0 refills | Status: DC
  Filled 2020-09-29: qty 30, 30d supply, fill #0

## 2020-09-29 MED ORDER — ZOSTER VAC RECOMB ADJUVANTED 50 MCG/0.5ML IM SUSR: INTRAMUSCULAR | 0 refills | Status: DC

## 2020-09-29 NOTE — Patient Instructions (Addendum)
For you wellness exam today I have ordered cbc, cmp and lipid panel.   For fatigue add tsh and t4.  Recommend exercise and healthy diet.  We will let you know lab results as they come in.  Follow up date appointment will be determined after lab review.    For ADD controlled with vyvanse get uds and update contract.  For Depression continue pristique.  For htn rx benicar in place of ace inhibitor due to cough side effect.   For insomnia and restless leg rx gabapentin.  For psoriasis continue to follow up with derm  For screening colon cancer place referal to gi md.  For pap placed gyn referral.   For fatigue tsh and t4.  Follow up date to be determined after lab review.  Filled out pt exam form.  Preventive Care 29-10 Years Old, Female Preventive care refers to lifestyle choices and visits with your health care provider that can promote health and wellness. This includes: A yearly physical exam. This is also called an annual wellness visit. Regular dental and eye exams. Immunizations. Screening for certain conditions. Healthy lifestyle choices, such as: Eating a healthy diet. Getting regular exercise. Not using drugs or products that contain nicotine and tobacco. Limiting alcohol use. What can I expect for my preventive care visit? Physical exam Your health care provider will check your: Height and weight. These may be used to calculate your BMI (body mass index). BMI is a measurement that tells if you are at a healthy weight. Heart rate and blood pressure. Body temperature. Skin for abnormal spots. Counseling Your health care provider may ask you questions about your: Past medical problems. Family's medical history. Alcohol, tobacco, and drug use. Emotional well-being. Home life and relationship well-being. Sexual activity. Diet, exercise, and sleep habits. Work and work Statistician. Access to firearms. Method of birth control. Menstrual cycle. Pregnancy  history. What immunizations do I need?  Vaccines are usually given at various ages, according to a schedule. Your health care provider will recommend vaccines for you based on your age, medicalhistory, and lifestyle or other factors, such as travel or where you work. What tests do I need? Blood tests Lipid and cholesterol levels. These may be checked every 5 years, or more often if you are over 84 years old. Hepatitis C test. Hepatitis B test. Screening Lung cancer screening. You may have this screening every year starting at age 67 if you have a 30-pack-year history of smoking and currently smoke or have quit within the past 15 years. Colorectal cancer screening. All adults should have this screening starting at age 32 and continuing until age 31. Your health care provider may recommend screening at age 19 if you are at increased risk. You will have tests every 1-10 years, depending on your results and the type of screening test. Diabetes screening. This is done by checking your blood sugar (glucose) after you have not eaten for a while (fasting). You may have this done every 1-3 years. Mammogram. This may be done every 1-2 years. Talk with your health care provider about when you should start having regular mammograms. This may depend on whether you have a family history of breast cancer. BRCA-related cancer screening. This may be done if you have a family history of breast, ovarian, tubal, or peritoneal cancers. Pelvic exam and Pap test. This may be done every 3 years starting at age 65. Starting at age 68, this may be done every 5 years if you have a Pap  test in combination with an HPV test. Other tests STD (sexually transmitted disease) testing, if you are at risk. Bone density scan. This is done to screen for osteoporosis. You may have this scan if you are at high risk for osteoporosis. Talk with your health care provider about your test results, treatment options,and if necessary,  the need for more tests. Follow these instructions at home: Eating and drinking  Eat a diet that includes fresh fruits and vegetables, whole grains, lean protein, and low-fat dairy products. Take vitamin and mineral supplements as recommended by your health care provider. Do not drink alcohol if: Your health care provider tells you not to drink. You are pregnant, may be pregnant, or are planning to become pregnant. If you drink alcohol: Limit how much you have to 0-1 drink a day. Be aware of how much alcohol is in your drink. In the [place] one drink equals one 12 oz bottle of beer (355 mL), one 5 oz glass of wine (148 mL), or one 1 oz glass of hard liquor (44 mL).  Lifestyle Take daily care of your teeth and gums. Brush your teeth every morning and night with fluoride toothpaste. Floss one time each day. Stay active. Exercise for at least 30 minutes 5 or more days each week. Do not use any products that contain nicotine or tobacco, such as cigarettes, e-cigarettes, and chewing tobacco. If you need help quitting, ask your health care provider. Do not use drugs. If you are sexually active, practice safe sex. Use a condom or other form of protection to prevent STIs (sexually transmitted infections). If you do not wish to become pregnant, use a form of birth control. If you plan to become pregnant, see your health care provider for a prepregnancy visit. If told by your health care provider, take low-dose aspirin daily starting at age 8. Find healthy ways to cope with stress, such as: Meditation, yoga, or listening to music. Journaling. Talking to a trusted person. Spending time with friends and family. Safety Always wear your seat belt while driving or riding in a vehicle. Do not drive: If you have been drinking alcohol. Do not ride with someone who has been drinking. When you are tired or distracted. While texting. Wear a helmet and other protective equipment during sports  activities. If you have firearms in your house, make sure you follow all gun safety procedures. What's next? Visit your health care provider once a year for an annual wellness visit. Ask your health care provider how often you should have your eyes and teeth checked. Stay up to date on all vaccines. This information is not intended to replace advice given to you by your health care provider. Make sure you discuss any questions you have with your healthcare provider. Document Revised: 12/16/2019 Document Reviewed: 11/22/2017 Elsevier Patient Education  2022 [employer].

## 2020-09-29 NOTE — Progress Notes (Signed)
Subjective:    Patient ID: [name], female    DOB: 12-Mar-1961, 60 y.o.   MRN: [id number]  HPI  Pt in for cpe/wellness exam.   Pt is nurse working in ED downstairs. Pt not exercising. Admits not eating very healthy. Some junk food at work. Does drink 2 mountain dews a day. Nonsmoker. Social alcohol use.    Pt has htn. She states stopped lisinopril due to dry cough(2 days after stopping and her cough stopped). She is willing to take ARB.   Pt has insomnia. Seems to occur worse with pristique. Pt did not think trazadone or vistaril helped.   Also feels some restless leg in past. Pt wants to consider gabpentin for restless leg and may benefit from sedation.   Hx of tmj rt side in the past.   Pt has psoriasis. Pt states otezla is helping her rash.   Hx of ADD. She is on vyvanse 50 mg daily.       Review of Systems  Constitutional:  Negative for appetite change, chills and diaphoresis.  HENT:  Negative for congestion, ear discharge, ear pain, facial swelling, hearing loss and sinus pressure.   Respiratory:  Negative for cough, chest tightness, shortness of breath and wheezing.   Cardiovascular:  Negative for chest pain and palpitations.  Gastrointestinal:  Negative for abdominal distention, abdominal pain, blood in stool, constipation, diarrhea and nausea.  Endocrine: Negative for polydipsia and polyphagia.  Genitourinary:  Negative for dyspareunia, dysuria, frequency, hematuria and urgency.  Musculoskeletal:  Negative for gait problem and neck pain.  Skin:  Negative for rash.  Neurological:  Negative for dizziness, syncope, weakness, numbness and headaches.  Hematological:  Negative for adenopathy. Does not bruise/bleed easily.  Psychiatric/Behavioral:  Negative for confusion, dysphoric mood and sleep disturbance. The patient is not nervous/anxious.        Mood and concentration controlled.    Past Medical History:  Diagnosis Date   ADD (attention deficit disorder)     Arthritis    GERD (gastroesophageal reflux disease)    Hypertension      Social History   Socioeconomic History   Marital status: Divorced    Spouse name: Not on file   Number of children: Not on file   Years of education: Not on file   Highest education level: Not on file  Occupational History   Not on file  Tobacco Use   Smoking status: Never   Smokeless tobacco: Never  Vaping Use   Vaping Use: Never used  Substance and Sexual Activity   Alcohol use: Yes    Comment: social   Drug use: No   Sexual activity: Not on file  Other Topics Concern   Not on file  Social History Narrative   Not on file   Social Determinants of Health   Financial Resource Strain: Not on file  Food Insecurity: Not on file  Transportation Needs: Not on file  Physical Activity: Not on file  Stress: Not on file  Social Connections: Not on file  Intimate Partner Violence: Not on file    Past Surgical History:  Procedure Laterality Date   CESAREAN SECTION     DILATION AND CURETTAGE OF UTERUS  1998   TONSILLECTOMY      Family History  Problem Relation Age of Onset   Cancer Mother    Mental illness Father    Alcoholism Father    Mental illness Sister    Suicidality Sister    Healthy Son  Healthy Son    Healthy Daughter     No Known Allergies  Current Outpatient Medications on File Prior to Visit  Medication Sig Dispense Refill   Acetaminophen (TYLENOL PO) Take by mouth as needed.     Apremilast (OTEZLA) 30 MG TABS Take 1 tablet (30 mg total) by mouth 2 (two) times daily. 60 tablet 3   BIOTIN PO Take 1 tablet by mouth daily.     chlorthalidone (HYGROTON) 25 MG tablet Take 1 tablet (25 mg total) by mouth daily. 30 tablet 5   chlorthalidone (HYGROTON) 25 MG tablet TAKE 1 TABLET BY MOUTH ONCE DAILY 30 tablet 5   Cholecalciferol (VITAMIN D3) 125 MCG (5000 UT) CAPS Take 5,000 Units by mouth daily.     clobetasol (TEMOVATE) 0.05 % external solution APPLY TO THE AFFECTED AREAS OF THE  SCALP 2 TIMES DAILY AS NEEDED 50 mL 5   clobetasol ointment (TEMOVATE) 0.05 % Apply 1 application topically 2 (two) times daily. (Patient not taking: No sig reported) 30 g 1   desvenlafaxine (PRISTIQ) 50 MG 24 hr tablet Take 1 tablet (50 mg total) by mouth daily. 90 tablet 1   desvenlafaxine (PRISTIQ) 50 MG 24 hr tablet TAKE 1 TABLET (50 MG TOTAL) BY MOUTH DAILY. 90 tablet 1   desvenlafaxine (PRISTIQ) 50 MG 24 hr tablet TAKE 1 TABLET BY MOUTH ONCE DAILY 30 tablet 0   desvenlafaxine (PRISTIQ) 50 MG 24 hr tablet TAKE 1 TABLET BY MOUTH ONCE DAILY 30 tablet 0   diclofenac Sodium (VOLTAREN) 1 % GEL APPLY 4 GRAMS ON TO THE SKIN 4 TIMES DAILY 200 g 2   diclofenac Sodium (VOLTAREN) 1 % GEL APPLY 2 G TOPICALLY 4 (FOUR) TIMES DAILY. 100 g 1   Diclofenac Sodium 2 % SOLN APPLY 2 PUMPS TO THE AFFECTED AREA OF THE KNEE 2 TIMES DAILY 112 g 1   fluconazole (DIFLUCAN) 150 MG tablet TAKE 1 TABLET BY MOUTH WEEKLY FOR 4 WEEKS 4 tablet 0   ibuprofen (ADVIL) 800 MG tablet TAKE 1 TABLET BY MOUTH EVERY 8 HOURS AS NEEDED FOR PAIN 30 tablet 0   IBUPROFEN PO Take by mouth as needed.     ketoconazole (NIZORAL) 2 % shampoo Apply 1 application topically 2 (two) times a week. 120 mL 1   ketoconazole (NIZORAL) 2 % shampoo APPLY 1 APPLICATION ON TO THE SKIN 2 TIMES A WEEK 120 mL 1   lisdexamfetamine (VYVANSE) 50 MG capsule Take 1 capsule (50 mg total) by mouth daily. 30 capsule 0   MAGNESIUM PO Take by mouth daily.     melatonin 1 MG TABS tablet Take 2 mg by mouth at bedtime.     Multiple Vitamins-Minerals (ZINC PO) Take 50 mg by mouth daily.     Omega-3 Fatty Acids (FISH OIL PO) Take 2,800 mg by mouth 2 (two) times daily.     omeprazole (PRILOSEC) 20 MG capsule Take 1 capsule (20 mg total) by mouth daily. 30 capsule 6   omeprazole (PRILOSEC) 20 MG capsule TAKE 1 CAPSULE (20 MG TOTAL) BY MOUTH DAILY. 30 capsule 3   omeprazole (PRILOSEC) 20 MG capsule TAKE 1 CAPSULE (20 MG TOTAL) BY MOUTH DAILY. 30 capsule 6   potassium  chloride (KLOR-CON) 10 MEQ tablet 1 tab po Monday, Wednesday and Friday 36 tablet 3   potassium chloride (KLOR-CON) 10 MEQ tablet TKAE 1 TABLET BY MOUTH ON MONDAY, WEDNESDAY AND FRIDAY 36 tablet 3   traZODone (DESYREL) 50 MG tablet Take 0.5-1 tablets (25-50 mg total)  by mouth at bedtime as needed for sleep. 30 tablet 3   No current facility-administered medications on file prior to visit.    BP (!) 150/90   Pulse (!) 107   Temp 98.2 F (36.8 C)   Resp 20   Ht 5\' 3"  (1.6 m)   Wt 216 lb 6.4 oz (98.2 kg)   LMP 12/25/2012 (Approximate)   SpO2 96%   BMI 38.33 kg/m       Objective:   Physical Exam  General Mental Status- Alert. General Appearance- Not in acute distress.   Skin General: Color- Normal Color. Moisture- Normal Moisture.  Neck Carotid Arteries- Normal color. Moisture- Normal Moisture. No carotid bruits. No JVD.  Chest and Lung Exam Auscultation: Breath Sounds:-Normal.  Cardiovascular Auscultation:Rythm- Regular. Murmurs & Other Heart Sounds:Auscultation of the heart reveals- No Murmurs.  Abdomen Inspection:-Inspeection Normal. Palpation/Percussion:Note:No mass. Palpation and Percussion of the abdomen reveal- Non Tender, Non Distended + BS, no rebound or guarding.  Neurologic Cranial Nerve exam:- CN III-XII intact(No nystagmus), symmetric smile. Strength:- 5/5 equal and symmetric strength both upper and lower extremities.       Assessment & Plan:   or you wellness exam today I have ordered cbc, cmp and lipid panel.   For fatigue add tsh and t4.  Recommend exercise and healthy diet.  We will let you know lab results as they come in.  Follow up date appointment will be determined after lab review.    For ADD controlled with vyvanse get uds and update contract.  For Depression continue pristique.  For htn rx benicar in place of ace inhibitor due to cough side effect.   For insomnia and restless leg rx gabapentin.  For psoriasis continue to  follow up with derm  For screening colon cancer place referal to gi md.  For pap placed gyn referral.   For fatigue tsh and t4.  Follow up date to be determined after lab review.  Filled out pt exam form.  02/24/2013, Esperanza Richters   [place] as did address multiple med problems in addition to wellness exam

## 2020-09-30 ENCOUNTER — Encounter: Payer: Self-pay | Admitting: Medical

## 2020-10-01 ENCOUNTER — Other Ambulatory Visit (HOSPITAL_BASED_OUTPATIENT_CLINIC_OR_DEPARTMENT_OTHER): Payer: Self-pay

## 2020-10-06 LAB — DRUG MONITORING, PANEL 8 WITH CONFIRMATION, URINE
6 Acetylmorphine: NEGATIVE ng/mL (ref <10)
Alcohol Metabolites: NEGATIVE ng/mL (ref <500)
Amphetamine: 7823 ng/mL — ABNORMAL HIGH (ref <250)
Amphetamines: POSITIVE ng/mL — AB (ref <500)
Benzodiazepines: NEGATIVE ng/mL (ref <100)
Buprenorphine, Urine: NEGATIVE ng/mL (ref <5)
Cocaine Metabolite: NEGATIVE ng/mL (ref <150)
Creatinine: 102.7 mg/dL (ref >=20.0)
MDMA: NEGATIVE ng/mL (ref <500)
Marijuana Metabolite: 257 ng/mL — ABNORMAL HIGH (ref <5)
Marijuana Metabolite: POSITIVE ng/mL — AB (ref <20)
Methamphetamine: NEGATIVE ng/mL (ref <250)
Opiates: NEGATIVE ng/mL (ref <100)
Oxidant: NEGATIVE ug/mL (ref <200)
Oxycodone: NEGATIVE ng/mL (ref <100)
pH: 6.8 (ref 4.5–9.0)

## 2020-10-06 LAB — DM TEMPLATE

## 2020-10-07 ENCOUNTER — Other Ambulatory Visit (HOSPITAL_BASED_OUTPATIENT_CLINIC_OR_DEPARTMENT_OTHER): Payer: Self-pay

## 2020-10-07 ENCOUNTER — Encounter: Payer: Self-pay | Admitting: Medical

## 2020-10-07 ENCOUNTER — Other Ambulatory Visit: Payer: Self-pay | Admitting: Medical

## 2020-10-07 NOTE — Telephone Encounter (Signed)
Requesting: vyvanse Contract:09/29/2020 UDS:09/29/2020 Last Visit:09/29/2020 Next Visit:N/A Last Refill:09/06/2020  Please Advise

## 2020-10-08 MED ORDER — LISDEXAMFETAMINE DIMESYLATE 50 MG PO CAPS
50.0000 mg | ORAL_CAPSULE | Freq: Every day | ORAL | 0 refills | Status: DC
  Filled 2020-10-08: qty 30, 30d supply, fill #0

## 2020-10-08 NOTE — Addendum Note (Signed)
Addended by: Gwenevere Abbot on: 10/08/2020 11:01 PM   Modules accepted: Orders

## 2020-10-11 ENCOUNTER — Other Ambulatory Visit (HOSPITAL_BASED_OUTPATIENT_CLINIC_OR_DEPARTMENT_OTHER): Payer: Self-pay

## 2020-10-14 ENCOUNTER — Other Ambulatory Visit (HOSPITAL_COMMUNITY): Payer: Self-pay

## 2020-10-16 ENCOUNTER — Encounter: Payer: Self-pay | Admitting: Medical

## 2020-10-18 ENCOUNTER — Telehealth: Payer: Self-pay | Admitting: Medical

## 2020-10-18 MED ORDER — GABAPENTIN 100 MG PO CAPS
ORAL_CAPSULE | ORAL | 3 refills | Status: DC
  Filled 2020-10-18: qty 90, 30d supply, fill #0
  Filled 2020-11-16: qty 90, 30d supply, fill #1

## 2020-10-18 NOTE — Telephone Encounter (Signed)
New gabapentin prescription as needs titration increase for restless leg.

## 2020-10-19 ENCOUNTER — Other Ambulatory Visit (HOSPITAL_BASED_OUTPATIENT_CLINIC_OR_DEPARTMENT_OTHER): Payer: Self-pay

## 2020-10-20 ENCOUNTER — Other Ambulatory Visit (HOSPITAL_COMMUNITY): Payer: Self-pay

## 2020-10-21 ENCOUNTER — Other Ambulatory Visit (HOSPITAL_COMMUNITY): Payer: Self-pay

## 2020-10-21 ENCOUNTER — Other Ambulatory Visit: Payer: Self-pay | Admitting: Pharmacist

## 2020-10-21 ENCOUNTER — Other Ambulatory Visit (HOSPITAL_BASED_OUTPATIENT_CLINIC_OR_DEPARTMENT_OTHER): Payer: Self-pay

## 2020-10-21 MED ORDER — OTEZLA 30 MG PO TABS
1.0000 | ORAL_TABLET | Freq: Two times a day (BID) | ORAL | 3 refills | Status: DC
  Filled 2020-10-21: qty 60, 30d supply, fill #0

## 2020-10-21 MED ORDER — OTEZLA 30 MG PO TABS
1.0000 | ORAL_TABLET | Freq: Two times a day (BID) | ORAL | 3 refills | Status: DC
  Filled 2020-10-21: qty 60, 30d supply, fill #0
  Filled 2020-11-15: qty 60, 30d supply, fill #1
  Filled 2020-12-15 – 2021-07-09 (×4): qty 60, 30d supply, fill #2

## 2020-10-27 ENCOUNTER — Other Ambulatory Visit (HOSPITAL_BASED_OUTPATIENT_CLINIC_OR_DEPARTMENT_OTHER): Payer: Self-pay

## 2020-10-27 MED FILL — Potassium Chloride Tab ER 10 mEq: ORAL | 84 days supply | Qty: 36 | Fill #0 | Status: AC

## 2020-11-04 ENCOUNTER — Telehealth: Payer: Self-pay | Admitting: Orthopedic Surgery

## 2020-11-04 NOTE — Telephone Encounter (Signed)
Patient is scheduled for left total knee arthroplasty 12-30-20 @ 12:15pm at [facility] Main with Dr. August Saucer.   She would like to know the length of time she can expect to be out of work.  Patient is [name].    Call back is [phone]

## 2020-11-05 NOTE — Telephone Encounter (Signed)
Probably 6 weeks to sedentary and 6 - 10 to more active type work not sure her role

## 2020-11-08 NOTE — Telephone Encounter (Signed)
Attempted to contact patient however had to leave a voicemail. Patient is able to contact our office if she has any additional questions or concerns.

## 2020-11-12 ENCOUNTER — Telehealth: Payer: Self-pay | Admitting: Medical

## 2020-11-12 ENCOUNTER — Other Ambulatory Visit (HOSPITAL_BASED_OUTPATIENT_CLINIC_OR_DEPARTMENT_OTHER): Payer: Self-pay

## 2020-11-12 ENCOUNTER — Encounter: Payer: Self-pay | Admitting: Medical

## 2020-11-12 ENCOUNTER — Other Ambulatory Visit: Payer: Self-pay | Admitting: Medical

## 2020-11-12 MED ORDER — LISDEXAMFETAMINE DIMESYLATE 50 MG PO CAPS
50.0000 mg | ORAL_CAPSULE | Freq: Every day | ORAL | 0 refills | Status: DC
  Filled 2020-11-12: qty 30, 30d supply, fill #0

## 2020-11-12 NOTE — Telephone Encounter (Signed)
Rx vyvanse sent to pt pharmacy. 

## 2020-11-15 ENCOUNTER — Other Ambulatory Visit (HOSPITAL_COMMUNITY): Payer: Self-pay

## 2020-11-17 ENCOUNTER — Other Ambulatory Visit (HOSPITAL_BASED_OUTPATIENT_CLINIC_OR_DEPARTMENT_OTHER): Payer: Self-pay

## 2020-11-22 ENCOUNTER — Other Ambulatory Visit (HOSPITAL_COMMUNITY): Payer: Self-pay

## 2020-11-24 ENCOUNTER — Other Ambulatory Visit (HOSPITAL_BASED_OUTPATIENT_CLINIC_OR_DEPARTMENT_OTHER): Payer: Self-pay

## 2020-12-03 ENCOUNTER — Encounter: Payer: Self-pay | Admitting: Medical

## 2020-12-06 ENCOUNTER — Telehealth: Payer: Self-pay | Admitting: Medical

## 2020-12-06 NOTE — Telephone Encounter (Signed)
Chart opened to review.

## 2020-12-08 ENCOUNTER — Encounter: Payer: Self-pay | Admitting: Medical

## 2020-12-09 ENCOUNTER — Other Ambulatory Visit (HOSPITAL_BASED_OUTPATIENT_CLINIC_OR_DEPARTMENT_OTHER): Payer: Self-pay

## 2020-12-09 ENCOUNTER — Other Ambulatory Visit: Payer: Self-pay | Admitting: Medical

## 2020-12-09 ENCOUNTER — Telehealth: Payer: Self-pay | Admitting: Medical

## 2020-12-09 MED ORDER — DESVENLAFAXINE SUCCINATE ER 50 MG PO TB24
ORAL_TABLET | Freq: Every day | ORAL | 1 refills | Status: DC
  Filled 2020-12-09: qty 90, 90d supply, fill #0
  Filled 2021-03-11: qty 90, 90d supply, fill #1

## 2020-12-09 MED ORDER — TRAZODONE HCL 50 MG PO TABS
ORAL_TABLET | ORAL | 3 refills | Status: DC
  Filled 2020-12-09: qty 30, 30d supply, fill #0

## 2020-12-09 MED ORDER — GABAPENTIN 400 MG PO CAPS
400.0000 mg | ORAL_CAPSULE | Freq: Every day | ORAL | 0 refills | Status: DC
  Filled 2020-12-09: qty 30, 30d supply, fill #0

## 2020-12-09 NOTE — Telephone Encounter (Signed)
Rx 400 mg  gabapentin sent to pt pharmacy.

## 2020-12-10 ENCOUNTER — Other Ambulatory Visit (HOSPITAL_BASED_OUTPATIENT_CLINIC_OR_DEPARTMENT_OTHER): Payer: Self-pay

## 2020-12-10 ENCOUNTER — Ambulatory Visit: Payer: No Typology Code available for payment source | Admitting: Medical

## 2020-12-12 ENCOUNTER — Other Ambulatory Visit: Payer: Self-pay | Admitting: Medical

## 2020-12-13 ENCOUNTER — Telehealth: Payer: Self-pay | Admitting: Orthopedic Surgery

## 2020-12-13 ENCOUNTER — Telehealth: Payer: Self-pay | Admitting: Medical

## 2020-12-13 ENCOUNTER — Other Ambulatory Visit (HOSPITAL_BASED_OUTPATIENT_CLINIC_OR_DEPARTMENT_OTHER): Payer: Self-pay

## 2020-12-13 MED ORDER — LISDEXAMFETAMINE DIMESYLATE 50 MG PO CAPS
50.0000 mg | ORAL_CAPSULE | Freq: Every day | ORAL | 0 refills | Status: DC
  Filled 2020-12-13: qty 30, 30d supply, fill #0

## 2020-12-13 NOTE — Telephone Encounter (Signed)
Requesting: Vyvanse 50mg  Contract: 09/29/2020 UDS: 09/29/2020 Last Visit: 09/29/2020 Next Visit: None Last Refill: 11/12/2020 #30 and 0RF  Please Advise

## 2020-12-13 NOTE — Telephone Encounter (Signed)
Received medical records release form,$25.00 cash and FMLA/Disability paperwork     Forwarding to [facility] today

## 2020-12-13 NOTE — Telephone Encounter (Signed)
Patient returned call asked for a call back as soon as possible. The number to contact patient is [phone]

## 2020-12-13 NOTE — Telephone Encounter (Signed)
Rx vyvanse sent to pt pharmacy. She needs controlled med visit in November. Get her scheduled for that please.

## 2020-12-14 ENCOUNTER — Other Ambulatory Visit (HOSPITAL_BASED_OUTPATIENT_CLINIC_OR_DEPARTMENT_OTHER): Payer: Self-pay

## 2020-12-14 NOTE — Telephone Encounter (Signed)
Sent message via mychart

## 2020-12-15 ENCOUNTER — Other Ambulatory Visit (HOSPITAL_COMMUNITY): Payer: Self-pay

## 2020-12-15 MED FILL — Clobetasol Propionate Soln 0.05%: CUTANEOUS | 30 days supply | Qty: 50 | Fill #1 | Status: AC

## 2020-12-15 NOTE — Telephone Encounter (Signed)
See below   Is it okay to move visit out?

## 2020-12-16 ENCOUNTER — Other Ambulatory Visit (HOSPITAL_BASED_OUTPATIENT_CLINIC_OR_DEPARTMENT_OTHER): Payer: Self-pay

## 2020-12-20 ENCOUNTER — Other Ambulatory Visit: Payer: Self-pay

## 2020-12-21 ENCOUNTER — Other Ambulatory Visit (HOSPITAL_COMMUNITY): Payer: Self-pay

## 2020-12-21 NOTE — Progress Notes (Addendum)
Surgical Instructions    Your procedure is scheduled on Thursday October 6th.  Report to [facility] Main Entrance "A" at 9:50 A.M., then check in with the Admitting office.  Call this number if you have problems the morning of surgery:  [phone]   If you have any questions prior to your surgery date call [phone]: Open Monday-Friday 8am-4pm    Remember:  Do not eat after midnight the night before your surgery  You may drink clear liquids until 8:50am the morning of your surgery.   Clear liquids allowed are: Water, Non-Citrus Juices (without pulp), Carbonated Beverages, Clear Tea, Black Coffee ONLY (NO MILK, CREAM OR POWDERED CREAMER of any kind), and Gatorade   Enhanced Recovery after Surgery for Orthopedics Enhanced Recovery after Surgery is a protocol used to improve the stress on your body and your recovery after surgery.  Patient Instructions  The day of surgery (if you do NOT have diabetes):  Drink ONE (1) Pre-Surgery Clear Ensure by ___8:50__ am the morning of surgery   This drink was given to you during your hospital  pre-op appointment visit. Nothing else to drink after completing the  Pre-Surgery Clear Ensure.          If you have questions, please contact your surgeon's office.     Take these medicines the morning of surgery with A SIP OF WATER omeprazole (PRILOSEC) 20 MG capsule desvenlafaxine (PRISTIQ) 50 MG 24 hr tablet Apremilast (OTEZLA) 30 MG TABS  IF NEEDED acetaminophen (TYLENOL) 500 MG tablet    As of today, STOP taking any Voltaren, Aspirin (unless otherwise instructed by your surgeon) Aleve, Naproxen, Ibuprofen, Motrin, Advil, Goody's, BC's, all herbal medications, fish oil, and all vitamins.          Do not wear jewelry or makeup Do not wear lotions, powders, perfumes, or deodorant. Do not shave 48 hours prior to surgery.  . Do not bring valuables to the hospital. DO Not wear nail polish, gel polish, artificial nails, or any other type  of covering on natural nails including finger and toenails. If patients have artificial nails, gel coating, etc. that need to be removed by a nail salon please have this removed prior to surgery or surgery may need to be canceled/delayed if the surgeon/ anesthesia feels like the patient is unable to be adequately monitored.             [name] is not responsible for any belongings or valuables.  Do NOT Smoke (Tobacco/Vaping)  24 hours prior to your procedure If you use a CPAP at night, you may bring your mask for your overnight stay.   Contacts, glasses, dentures or bridgework may not be worn into surgery, please bring cases for these belongings   For patients admitted to the hospital, discharge time will be determined by your treatment team.   Patients discharged the day of surgery will not be allowed to drive home, and someone needs to stay with them for 24 hours.  NO VISITORS WILL BE ALLOWED IN PRE-OP WHERE PATIENTS GET READY FOR SURGERY.  ONLY 1 SUPPORT PERSON MAY BE PRESENT IN THE WAITING ROOM WHILE YOU ARE IN SURGERY.  IF YOU ARE TO BE ADMITTED, ONCE YOU ARE IN YOUR ROOM YOU WILL BE ALLOWED TWO (2) VISITORS.  Minor children may have two parents present. Special consideration for safety and communication needs will be reviewed on a case by case basis.  Special instructions:    Oral Hygiene is also important to reduce your  risk of infection.  Remember - BRUSH YOUR TEETH THE MORNING OF SURGERY WITH YOUR REGULAR TOOTHPASTE   [name]- Preparing For Surgery  Before surgery, you can play an important role. Because skin is not sterile, your skin needs to be as free of germs as possible. You can reduce the number of germs on your skin by washing with CHG (chlorahexidine gluconate) Soap before surgery.  CHG is an antiseptic cleaner which kills germs and bonds with the skin to continue killing germs even after washing.     Please do not use if you have an allergy to CHG or antibacterial  soaps. If your skin becomes reddened/irritated stop using the CHG.  Do not shave (including legs and underarms) for at least 48 hours prior to first CHG shower. It is OK to shave your face.  Please follow these instructions carefully.     Shower the NIGHT BEFORE SURGERY and the MORNING OF SURGERY with CHG Soap.   If you chose to wash your hair, wash your hair first as usual with your normal shampoo. After you shampoo, rinse your hair and body thoroughly to remove the shampoo.  Then [employer] and genitals (private parts) with your normal soap and rinse thoroughly to remove soap.  After that Use CHG Soap as you would any other liquid soap. You can apply CHG directly to the skin and wash gently with a scrungie or a clean washcloth.   Apply the CHG Soap to your body ONLY FROM THE NECK DOWN.  Do not use on open wounds or open sores. Avoid contact with your eyes, ears, mouth and genitals (private parts). Wash Face and genitals (private parts)  with your normal soap.   Wash thoroughly, paying special attention to the area where your surgery will be performed.  Thoroughly rinse your body with warm water from the neck down.  DO NOT shower/wash with your normal soap after using and rinsing off the CHG Soap.  Pat yourself dry with a CLEAN TOWEL.  Wear CLEAN PAJAMAS to bed the night before surgery  Place CLEAN SHEETS on your bed the night before your surgery  DO NOT SLEEP WITH PETS.   Day of Surgery:  Take a shower with CHG soap. Wear Clean/Comfortable clothing the morning of surgery Do not apply any deodorants/lotions.   Remember to brush your teeth WITH YOUR REGULAR TOOTHPASTE.   Please read over the following fact sheets that you were given.

## 2020-12-22 ENCOUNTER — Other Ambulatory Visit (HOSPITAL_COMMUNITY): Payer: Self-pay

## 2020-12-22 ENCOUNTER — Encounter: Payer: Self-pay | Admitting: Orthopedic Surgery

## 2020-12-22 ENCOUNTER — Other Ambulatory Visit (HOSPITAL_BASED_OUTPATIENT_CLINIC_OR_DEPARTMENT_OTHER): Payer: Self-pay

## 2020-12-22 ENCOUNTER — Inpatient Hospital Stay (HOSPITAL_COMMUNITY)
Admission: RE | Admit: 2020-12-22 | Discharge: 2020-12-22 | Disposition: A | Discharge status: Home / Self Care | Payer: No Typology Code available for payment source | Source: Ambulatory Visit

## 2020-12-23 ENCOUNTER — Other Ambulatory Visit (HOSPITAL_COMMUNITY): Payer: Self-pay

## 2020-12-24 ENCOUNTER — Other Ambulatory Visit (HOSPITAL_COMMUNITY): Payer: Self-pay

## 2020-12-24 ENCOUNTER — Telehealth: Payer: Self-pay | Admitting: Orthopedic Surgery

## 2020-12-24 NOTE — Telephone Encounter (Signed)
Patient is scheduled for left total knee arthroplasty on 12-30-20 with Dr. August Saucer.    She would like to do her physical therapy at the [employer] where she works so she can cut down on her drive time  instead of doing the therapy in [place].   Cb  [phone]

## 2020-12-27 ENCOUNTER — Other Ambulatory Visit (HOSPITAL_COMMUNITY): Payer: Self-pay

## 2020-12-27 ENCOUNTER — Other Ambulatory Visit (HOSPITAL_BASED_OUTPATIENT_CLINIC_OR_DEPARTMENT_OTHER): Payer: Self-pay

## 2020-12-27 NOTE — Progress Notes (Signed)
Surgical Instructions    Your procedure is scheduled on Thursday October 6th.  Report to [facility] Main Entrance "A" at 9:45 A.M., then check in with the Admitting office.  Call this number if you have problems the morning of surgery:  [phone]   If you have any questions prior to your surgery date call [phone]: Open Monday-Friday 8am-4pm    Remember:  Do not eat after midnight the night before your surgery  You may drink clear liquids until 8:45 a.m. the morning of your surgery.   Clear liquids allowed are: Water, Non-Citrus Juices (without pulp), Carbonated Beverages, Clear Tea, Black Coffee ONLY (NO MILK, CREAM OR POWDERED CREAMER of any kind), and Gatorade.   Enhanced Recovery after Surgery for Orthopedics Enhanced Recovery after Surgery is a protocol used to improve the stress on your body and your recovery after surgery.  Patient Instructions  The day of surgery (if you do NOT have diabetes):  Drink ONE (1) Pre-Surgery Clear Ensure by 8:45 am the morning of surgery   This drink was given to you during your hospital  pre-op appointment visit. Nothing else to drink after completing the  Pre-Surgery Clear Ensure.          If you have questions, please contact your surgeon's office.     Take these medicines the morning of surgery with A SIP OF WATER omeprazole (PRILOSEC)  desvenlafaxine (PRISTIQ)  Apremilast (OTEZLA)-if able to tolerate on an empty stomach  IF NEEDED acetaminophen (TYLENOL)   As of today, STOP taking any Voltaren, Aspirin (unless otherwise instructed by your surgeon) Aleve, Naproxen, Ibuprofen, Motrin, Advil, Goody's, BC's, all herbal medications, fish oil, and all vitamins.          Do not wear jewelry or makeup Do not wear lotions, powders, perfumes, or deodorant. Do not shave 48 hours prior to surgery.  . Do not bring valuables to the hospital. DO Not wear nail polish, gel polish, artificial nails, or any other type of covering on  natural nails including finger and toenails. If patients have artificial nails, gel coating, etc. that need to be removed by a nail salon please have this removed prior to surgery or surgery may need to be canceled/delayed if the surgeon/ anesthesia feels like the patient is unable to be adequately monitored.             [facility] is not responsible for any belongings or valuables.  Do NOT Smoke (Tobacco/Vaping)  24 hours prior to your procedure If you use a CPAP at night, you may bring your mask for your overnight stay.   Contacts, glasses, dentures or bridgework may not be worn into surgery, please bring cases for these belongings   For patients admitted to the hospital, discharge time will be determined by your treatment team.   Patients discharged the day of surgery will not be allowed to drive home, and someone needs to stay with them for 24 hours.  NO VISITORS WILL BE ALLOWED IN PRE-OP WHERE PATIENTS GET READY FOR SURGERY.  ONLY 1 SUPPORT PERSON MAY BE PRESENT IN THE WAITING ROOM WHILE YOU ARE IN SURGERY.  IF YOU ARE TO BE ADMITTED, ONCE YOU ARE IN YOUR ROOM YOU WILL BE ALLOWED TWO (2) VISITORS.  Minor children may have two parents present. Special consideration for safety and communication needs will be reviewed on a case by case basis.  Special instructions:    Oral Hygiene is also important to reduce your risk of infection.  Remember -  BRUSH YOUR TEETH THE MORNING OF SURGERY WITH YOUR REGULAR TOOTHPASTE   [employer]- Preparing For Surgery  Before surgery, you can play an important role. Because skin is not sterile, your skin needs to be as free of germs as possible. You can reduce the number of germs on your skin by washing with CHG (chlorahexidine gluconate) Soap before surgery.  CHG is an antiseptic cleaner which kills germs and bonds with the skin to continue killing germs even after washing.     Please do not use if you have an allergy to CHG or antibacterial soaps. If your  skin becomes reddened/irritated stop using the CHG.  Do not shave (including legs and underarms) for at least 48 hours prior to first CHG shower. It is OK to shave your face.  Please follow these instructions carefully.     Shower the NIGHT BEFORE SURGERY and the MORNING OF SURGERY with CHG Soap.   If you chose to wash your hair, wash your hair first as usual with your normal shampoo. After you shampoo, rinse your hair and body thoroughly to remove the shampoo.  Then [employer] and genitals (private parts) with your normal soap and rinse thoroughly to remove soap.  After that Use CHG Soap as you would any other liquid soap. You can apply CHG directly to the skin and wash gently with a scrungie or a clean washcloth.   Apply the CHG Soap to your body ONLY FROM THE NECK DOWN.  Do not use on open wounds or open sores. Avoid contact with your eyes, ears, mouth and genitals (private parts). Wash Face and genitals (private parts)  with your normal soap.   Wash thoroughly, paying special attention to the area where your surgery will be performed.  Thoroughly rinse your body with warm water from the neck down.  DO NOT shower/wash with your normal soap after using and rinsing off the CHG Soap.  Pat yourself dry with a CLEAN TOWEL.  Wear CLEAN PAJAMAS to bed the night before surgery  Place CLEAN SHEETS on your bed the night before your surgery  DO NOT SLEEP WITH PETS.   Day of Surgery:  Take a shower with CHG soap. Wear Clean/Comfortable clothing the morning of surgery Do not apply any deodorants/lotions.   Remember to brush your teeth WITH YOUR REGULAR TOOTHPASTE.   Please read over the following fact sheets that you were given.    3 days prior to your procedure or After your COVID test   You are not required to quarantine however you are required to wear a well-fitting mask when you are out and around people not in your household. If your mask becomes wet or soiled, replace with a new  one.   Wash your hands often with soap and water for 20 seconds or clean your hands with an alcohol-based hand sanitizer that contains at least 60% alcohol.   Do not share personal items.   Notify your provider:  o if you are in close contact with someone who has COVID  o or if you develop a fever of 100.4 or greater, sneezing, cough, sore throat, shortness of breath or body aches.

## 2020-12-28 ENCOUNTER — Other Ambulatory Visit (HOSPITAL_COMMUNITY): Payer: Self-pay

## 2020-12-28 ENCOUNTER — Other Ambulatory Visit: Payer: Self-pay

## 2020-12-28 ENCOUNTER — Encounter (HOSPITAL_COMMUNITY): Payer: Self-pay

## 2020-12-28 ENCOUNTER — Encounter (HOSPITAL_COMMUNITY)
Admission: RE | Admit: 2020-12-28 | Discharge: 2020-12-28 | Disposition: A | Discharge status: Home / Self Care | Payer: No Typology Code available for payment source | Source: Ambulatory Visit | Attending: Orthopedic Surgery | Admitting: Orthopedic Surgery

## 2020-12-28 DIAGNOSIS — Z01818 Encounter for other preprocedural examination: Secondary | ICD-10-CM | POA: Diagnosis not present

## 2020-12-28 DIAGNOSIS — G8929 Other chronic pain: Secondary | ICD-10-CM

## 2020-12-28 DIAGNOSIS — Z20822 Contact with and (suspected) exposure to covid-19: Secondary | ICD-10-CM | POA: Insufficient documentation

## 2020-12-28 HISTORY — DX: Depression, unspecified: F32.A

## 2020-12-28 HISTORY — DX: Psoriasis, unspecified: L40.9

## 2020-12-28 HISTORY — DX: Restless legs syndrome: G25.81

## 2020-12-28 LAB — BASIC METABOLIC PANEL
Anion gap: 9 (ref 5–15)
BUN: 16 mg/dL (ref 6–20)
CO2: 30 mmol/L (ref 22–32)
Calcium: 9.1 mg/dL (ref 8.9–10.3)
Chloride: 100 mmol/L (ref 98–111)
Creatinine, Ser: 0.63 mg/dL (ref 0.44–1.00)
GFR, Estimated: >60 mL/min (ref >60)
Glucose, Bld: 118 mg/dL — ABNORMAL HIGH (ref 70–99)
Potassium: 3.3 mmol/L — ABNORMAL LOW (ref 3.5–5.1)
Sodium: 139 mmol/L (ref 135–145)

## 2020-12-28 LAB — SURGICAL PCR SCREEN
MRSA, PCR: NEGATIVE
Staphylococcus aureus: NEGATIVE

## 2020-12-28 LAB — URINALYSIS, ROUTINE W REFLEX MICROSCOPIC
Bilirubin Urine: NEGATIVE
Glucose, UA: NEGATIVE mg/dL
Ketones, ur: NEGATIVE mg/dL
Leukocytes,Ua: NEGATIVE
Nitrite: NEGATIVE
Protein, ur: NEGATIVE mg/dL
Specific Gravity, Urine: 1.025 (ref 1.005–1.030)
pH: 5 (ref 5.0–8.0)

## 2020-12-28 LAB — CBC
HCT: 44.5 % (ref 36.0–46.0)
Hemoglobin: 15.4 g/dL — ABNORMAL HIGH (ref 12.0–15.0)
MCH: 32.2 pg (ref 26.0–34.0)
MCHC: 34.6 g/dL (ref 30.0–36.0)
MCV: 92.9 fL (ref 80.0–100.0)
Platelets: 356 10*3/uL (ref 150–400)
RBC: 4.79 MIL/uL (ref 3.87–5.11)
RDW: 13 % (ref 11.5–15.5)
WBC: 9.8 10*3/uL (ref 4.0–10.5)
nRBC: 0 % (ref 0.0–0.2)

## 2020-12-28 LAB — SARS CORONAVIRUS 2 (TAT 6-24 HRS): SARS Coronavirus 2: NEGATIVE

## 2020-12-28 NOTE — Telephone Encounter (Signed)
Referral has been created

## 2020-12-28 NOTE — Telephone Encounter (Signed)
I will make changes to the other one, no need to make a new referral. thanks

## 2020-12-28 NOTE — Telephone Encounter (Signed)
Do you want me to place a new referral or just make changes to the old one that was created for this patient.

## 2020-12-28 NOTE — Telephone Encounter (Signed)
Actually there is no order for PT in her chart, go ahead and make a new referral for this one. Sorry for the confusion.

## 2020-12-28 NOTE — Progress Notes (Signed)
PCP - Esperanza Richters PA-C Cardiologist - denies  Chest x-ray - n/a EKG - 12/28/20 Stress Test - denies ECHO - denies Cardiac Cath - denies  Sleep Study - denies CPAP - denies  Blood Thinner Instructions: n/a Aspirin Instructions: n/a  ERAS Protcol -Clear liquids until 0845 DOS PRE-SURGERY Ensure or G2- Ensure to complete but 0845 DOS  COVID TEST- 12/28/20; done in PAT.  Anesthesia review:   Patient denies shortness of breath, fever, cough and chest pain at PAT appointment   All instructions explained to the patient, with a verbal understanding of the material. Patient agrees to go over the instructions while at home for a better understanding. Patient also instructed to self quarantine after being tested for COVID-19. The opportunity to ask questions was provided.

## 2020-12-29 ENCOUNTER — Other Ambulatory Visit (HOSPITAL_COMMUNITY): Payer: Self-pay

## 2020-12-29 LAB — URINE CULTURE

## 2020-12-29 MED ORDER — TRANEXAMIC ACID 1000 MG/10ML IV SOLN
2000.0000 mg | INTRAVENOUS | Status: DC
  Filled 2020-12-29: qty 20

## 2020-12-30 ENCOUNTER — Encounter (HOSPITAL_COMMUNITY)
Admission: RE | Disposition: A | Discharge status: Home / Self Care | Payer: Self-pay | Source: Home / Self Care | Attending: Orthopedic Surgery

## 2020-12-30 ENCOUNTER — Other Ambulatory Visit (HOSPITAL_COMMUNITY): Payer: Self-pay

## 2020-12-30 ENCOUNTER — Ambulatory Visit (HOSPITAL_COMMUNITY): Payer: No Typology Code available for payment source | Admitting: Anesthesiology

## 2020-12-30 ENCOUNTER — Other Ambulatory Visit: Payer: Self-pay

## 2020-12-30 ENCOUNTER — Observation Stay (HOSPITAL_COMMUNITY)
Admission: RE | Admit: 2020-12-30 | Discharge: 2021-01-01 | Disposition: A | Discharge status: Home / Self Care | Payer: No Typology Code available for payment source | Attending: Orthopedic Surgery | Admitting: Orthopedic Surgery

## 2020-12-30 ENCOUNTER — Encounter (HOSPITAL_COMMUNITY): Payer: Self-pay | Admitting: Orthopedic Surgery

## 2020-12-30 DIAGNOSIS — I10 Essential (primary) hypertension: Secondary | ICD-10-CM | POA: Insufficient documentation

## 2020-12-30 DIAGNOSIS — M1712 Unilateral primary osteoarthritis, left knee: Principal | ICD-10-CM

## 2020-12-30 DIAGNOSIS — Z96652 Presence of left artificial knee joint: Secondary | ICD-10-CM

## 2020-12-30 HISTORY — PX: TOTAL KNEE ARTHROPLASTY: SHX125

## 2020-12-30 SURGERY — ARTHROPLASTY, KNEE, TOTAL
Anesthesia: Spinal | Site: Knee | Laterality: Left

## 2020-12-30 MED ORDER — FENTANYL CITRATE (PF) 100 MCG/2ML IJ SOLN: 50.0000 ug | Freq: Once | INTRAMUSCULAR | Status: AC

## 2020-12-30 MED ORDER — OXYCODONE HCL 5 MG PO TABS
5.0000 mg | ORAL_TABLET | ORAL | Status: DC | PRN
  Administered 2020-12-30 – 2021-01-01 (×8): 10 mg via ORAL
  Filled 2020-12-30 (×8): qty 2

## 2020-12-30 MED ORDER — IRRISEPT - 450ML BOTTLE WITH 0.05% CHG IN STERILE WATER, USP 99.95% OPTIME
TOPICAL | Status: DC | PRN
  Administered 2020-12-30: 450 mL

## 2020-12-30 MED ORDER — DEXAMETHASONE SODIUM PHOSPHATE 4 MG/ML IJ SOLN
INTRAMUSCULAR | Status: DC | PRN
  Administered 2020-12-30: 5 mg via PERINEURAL

## 2020-12-30 MED ORDER — PROPOFOL 10 MG/ML IV BOLUS
INTRAVENOUS | Status: AC
  Filled 2020-12-30: qty 20

## 2020-12-30 MED ORDER — METHOCARBAMOL 1000 MG/10ML IJ SOLN
500.0000 mg | Freq: Four times a day (QID) | INTRAVENOUS | Status: DC | PRN
  Filled 2020-12-30: qty 5

## 2020-12-30 MED ORDER — POVIDONE-IODINE 10 % EX SWAB
2.0000 "application " | Freq: Once | CUTANEOUS | Status: AC
  Administered 2020-12-30: 2 via TOPICAL

## 2020-12-30 MED ORDER — FENTANYL CITRATE (PF) 100 MCG/2ML IJ SOLN
25.0000 ug | INTRAMUSCULAR | Status: DC | PRN
  Administered 2020-12-30: 50 ug via INTRAVENOUS

## 2020-12-30 MED ORDER — CHLORHEXIDINE GLUCONATE 0.12 % MT SOLN
15.0000 mL | Freq: Once | OROMUCOSAL | Status: AC
  Filled 2020-12-30: qty 15

## 2020-12-30 MED ORDER — FENTANYL CITRATE (PF) 100 MCG/2ML IJ SOLN
INTRAMUSCULAR | Status: DC | PRN
  Administered 2020-12-30: 50 ug via INTRAVENOUS

## 2020-12-30 MED ORDER — VENLAFAXINE HCL ER 75 MG PO CP24
75.0000 mg | ORAL_CAPSULE | Freq: Every day | ORAL | Status: DC
  Administered 2020-12-31 – 2021-01-01 (×2): 75 mg via ORAL
  Filled 2020-12-30 (×2): qty 1

## 2020-12-30 MED ORDER — CLONIDINE HCL (ANALGESIA) 100 MCG/ML EP SOLN
EPIDURAL | Status: AC
  Filled 2020-12-30: qty 10

## 2020-12-30 MED ORDER — PROMETHAZINE HCL 25 MG/ML IJ SOLN: 6.2500 mg | INTRAMUSCULAR | Status: DC | PRN

## 2020-12-30 MED ORDER — BUPIVACAINE IN DEXTROSE 0.75-8.25 % IT SOLN
INTRATHECAL | Status: DC | PRN
  Administered 2020-12-30: 2 mL via INTRATHECAL

## 2020-12-30 MED ORDER — BUPIVACAINE LIPOSOME 1.3 % IJ SUSP
INTRAMUSCULAR | Status: AC
  Filled 2020-12-30: qty 20

## 2020-12-30 MED ORDER — PANTOPRAZOLE SODIUM 40 MG PO TBEC
40.0000 mg | DELAYED_RELEASE_TABLET | Freq: Every day | ORAL | Status: DC
  Administered 2020-12-31 – 2021-01-01 (×2): 40 mg via ORAL
  Filled 2020-12-30 (×2): qty 1

## 2020-12-30 MED ORDER — METOCLOPRAMIDE HCL 5 MG PO TABS
5.0000 mg | ORAL_TABLET | Freq: Three times a day (TID) | ORAL | Status: DC | PRN
Start: 2020-12-30 — End: 2021-01-01

## 2020-12-30 MED ORDER — CHLORTHALIDONE 50 MG PO TABS
50.0000 mg | ORAL_TABLET | Freq: Every day | ORAL | Status: DC
  Administered 2021-01-01: 50 mg via ORAL
  Filled 2020-12-30 (×4): qty 1

## 2020-12-30 MED ORDER — FENTANYL CITRATE (PF) 250 MCG/5ML IJ SOLN
INTRAMUSCULAR | Status: AC
  Filled 2020-12-30: qty 5

## 2020-12-30 MED ORDER — PROPOFOL 500 MG/50ML IV EMUL
INTRAVENOUS | Status: DC | PRN
  Administered 2020-12-30: 75 ug/kg/min via INTRAVENOUS
  Administered 2020-12-30: 50 ug/kg/min via INTRAVENOUS

## 2020-12-30 MED ORDER — PROPOFOL 1000 MG/100ML IV EMUL
INTRAVENOUS | Status: AC
  Filled 2020-12-30: qty 100

## 2020-12-30 MED ORDER — 0.9 % SODIUM CHLORIDE (POUR BTL) OPTIME
TOPICAL | Status: DC | PRN
  Administered 2020-12-30: 3000 mL

## 2020-12-30 MED ORDER — ONDANSETRON HCL 4 MG/2ML IJ SOLN
INTRAMUSCULAR | Status: AC
  Filled 2020-12-30: qty 2

## 2020-12-30 MED ORDER — MORPHINE SULFATE (PF) 4 MG/ML IV SOLN
INTRAVENOUS | Status: AC
  Filled 2020-12-30: qty 2

## 2020-12-30 MED ORDER — GABAPENTIN 400 MG PO CAPS
400.0000 mg | ORAL_CAPSULE | Freq: Every day | ORAL | Status: DC
  Administered 2020-12-30: 400 mg via ORAL
  Filled 2020-12-30: qty 1

## 2020-12-30 MED ORDER — PROPOFOL 10 MG/ML IV BOLUS
INTRAVENOUS | Status: DC | PRN
  Administered 2020-12-30: 40 mg via INTRAVENOUS
  Administered 2020-12-30: 50 mg via INTRAVENOUS
  Administered 2020-12-30: 20 mg via INTRAVENOUS
  Administered 2020-12-30: 60 mg via INTRAVENOUS
  Administered 2020-12-30: 50 mg via INTRAVENOUS

## 2020-12-30 MED ORDER — PHENOL 1.4 % MT LIQD: 1.0000 | OROMUCOSAL | Status: DC | PRN

## 2020-12-30 MED ORDER — MORPHINE SULFATE (PF) 4 MG/ML IV SOLN
INTRAVENOUS | Status: DC | PRN
  Administered 2020-12-30: 8 mg via SUBCUTANEOUS

## 2020-12-30 MED ORDER — LISDEXAMFETAMINE DIMESYLATE 50 MG PO CAPS: 50.0000 mg | ORAL_CAPSULE | Freq: Every day | ORAL | Status: DC

## 2020-12-30 MED ORDER — IRBESARTAN 150 MG PO TABS
150.0000 mg | ORAL_TABLET | Freq: Every day | ORAL | Status: DC
  Administered 2020-12-31 – 2021-01-01 (×2): 150 mg via ORAL
  Filled 2020-12-30 (×3): qty 1

## 2020-12-30 MED ORDER — ONDANSETRON HCL 4 MG/2ML IJ SOLN
INTRAMUSCULAR | Status: DC | PRN
  Administered 2020-12-30: 4 mg via INTRAVENOUS

## 2020-12-30 MED ORDER — TRANEXAMIC ACID 1000 MG/10ML IV SOLN
INTRAVENOUS | Status: DC | PRN
  Administered 2020-12-30: 2000 mg via TOPICAL

## 2020-12-30 MED ORDER — CLONIDINE HCL (ANALGESIA) 100 MCG/ML EP SOLN
EPIDURAL | Status: DC | PRN
  Administered 2020-12-30: 1 mL

## 2020-12-30 MED ORDER — SODIUM CHLORIDE 0.9 % IR SOLN
Status: DC | PRN
  Administered 2020-12-30: 3000 mL

## 2020-12-30 MED ORDER — PHENYLEPHRINE HCL-NACL 20-0.9 MG/250ML-% IV SOLN
INTRAVENOUS | Status: DC | PRN
  Administered 2020-12-30: 100 ug/min via INTRAVENOUS

## 2020-12-30 MED ORDER — CEFAZOLIN SODIUM-DEXTROSE 2-4 GM/100ML-% IV SOLN
2.0000 g | INTRAVENOUS | Status: AC
  Administered 2020-12-30: 2 g via INTRAVENOUS
  Filled 2020-12-30: qty 100

## 2020-12-30 MED ORDER — LACTATED RINGERS IV SOLN: INTRAVENOUS | Status: DC

## 2020-12-30 MED ORDER — ASPIRIN 81 MG PO CHEW
81.0000 mg | CHEWABLE_TABLET | Freq: Two times a day (BID) | ORAL | Status: DC
  Administered 2020-12-30 – 2021-01-01 (×4): 81 mg via ORAL
  Filled 2020-12-30 (×4): qty 1

## 2020-12-30 MED ORDER — TRANEXAMIC ACID-NACL 1000-0.7 MG/100ML-% IV SOLN
1000.0000 mg | INTRAVENOUS | Status: AC
  Administered 2020-12-30: 1000 mg via INTRAVENOUS
  Filled 2020-12-30: qty 100

## 2020-12-30 MED ORDER — VANCOMYCIN HCL 1000 MG IV SOLR
INTRAVENOUS | Status: AC
  Filled 2020-12-30: qty 20

## 2020-12-30 MED ORDER — ROPIVACAINE HCL 5 MG/ML IJ SOLN
INTRAMUSCULAR | Status: DC | PRN
  Administered 2020-12-30: 30 mL via PERINEURAL

## 2020-12-30 MED ORDER — SODIUM CHLORIDE 0.9% FLUSH
INTRAVENOUS | Status: DC | PRN
  Administered 2020-12-30 (×2): 10 mL

## 2020-12-30 MED ORDER — FENTANYL CITRATE (PF) 100 MCG/2ML IJ SOLN
INTRAMUSCULAR | Status: AC
  Administered 2020-12-30: 50 ug via INTRAVENOUS
  Filled 2020-12-30: qty 2

## 2020-12-30 MED ORDER — BUPIVACAINE HCL 0.25 % IJ SOLN
INTRAMUSCULAR | Status: DC | PRN
  Administered 2020-12-30: 20 mL
  Administered 2020-12-30: 10 mL

## 2020-12-30 MED ORDER — ORAL CARE MOUTH RINSE
15.0000 mL | Freq: Once | OROMUCOSAL | Status: AC
  Administered 2020-12-30: 15 mL via OROMUCOSAL

## 2020-12-30 MED ORDER — LISDEXAMFETAMINE DIMESYLATE 20 MG PO CAPS
20.0000 mg | ORAL_CAPSULE | ORAL | Status: DC
  Administered 2020-12-31 – 2021-01-01 (×2): 20 mg via ORAL
  Filled 2020-12-30 (×2): qty 1

## 2020-12-30 MED ORDER — ACETAMINOPHEN 325 MG PO TABS: 325.0000 mg | ORAL_TABLET | Freq: Four times a day (QID) | ORAL | Status: DC | PRN

## 2020-12-30 MED ORDER — ACETAMINOPHEN 500 MG PO TABS
1000.0000 mg | ORAL_TABLET | Freq: Four times a day (QID) | ORAL | Status: AC
  Administered 2020-12-30 – 2020-12-31 (×4): 1000 mg via ORAL
  Filled 2020-12-30 (×4): qty 2

## 2020-12-30 MED ORDER — BUPIVACAINE LIPOSOME 1.3 % IJ SUSP
INTRAMUSCULAR | Status: DC | PRN
  Administered 2020-12-30: 20 mL

## 2020-12-30 MED ORDER — ONDANSETRON HCL 4 MG PO TABS: 4.0000 mg | ORAL_TABLET | Freq: Four times a day (QID) | ORAL | Status: DC | PRN

## 2020-12-30 MED ORDER — ONDANSETRON HCL 4 MG/2ML IJ SOLN: 4.0000 mg | Freq: Four times a day (QID) | INTRAMUSCULAR | Status: DC | PRN

## 2020-12-30 MED ORDER — MIDAZOLAM HCL 2 MG/2ML IJ SOLN
INTRAMUSCULAR | Status: AC
  Filled 2020-12-30: qty 2

## 2020-12-30 MED ORDER — DOCUSATE SODIUM 100 MG PO CAPS
100.0000 mg | ORAL_CAPSULE | Freq: Two times a day (BID) | ORAL | Status: DC
  Administered 2020-12-31: 100 mg via ORAL
  Filled 2020-12-30 (×4): qty 1

## 2020-12-30 MED ORDER — HYDROMORPHONE HCL 1 MG/ML IJ SOLN
0.5000 mg | INTRAMUSCULAR | Status: DC | PRN
Start: 2020-12-30 — End: 2021-01-01
  Administered 2020-12-30: 0.5 mg via INTRAVENOUS
  Filled 2020-12-30: qty 0.5

## 2020-12-30 MED ORDER — MENTHOL 3 MG MT LOZG: 1.0000 | LOZENGE | OROMUCOSAL | Status: DC | PRN

## 2020-12-30 MED ORDER — VANCOMYCIN HCL 1000 MG IV SOLR
INTRAVENOUS | Status: DC | PRN
  Administered 2020-12-30: 1000 mg

## 2020-12-30 MED ORDER — MIDAZOLAM HCL 2 MG/2ML IJ SOLN
INTRAMUSCULAR | Status: AC
  Administered 2020-12-30: 2 mg via INTRAVENOUS
  Filled 2020-12-30: qty 2

## 2020-12-30 MED ORDER — LISDEXAMFETAMINE DIMESYLATE 30 MG PO CAPS
30.0000 mg | ORAL_CAPSULE | ORAL | Status: DC
  Administered 2020-12-31 – 2021-01-01 (×2): 30 mg via ORAL
  Filled 2020-12-30 (×2): qty 1

## 2020-12-30 MED ORDER — POVIDONE-IODINE 7.5 % EX SOLN
Freq: Once | CUTANEOUS | Status: DC
  Filled 2020-12-30: qty 118

## 2020-12-30 MED ORDER — MIDAZOLAM HCL 2 MG/2ML IJ SOLN: 2.0000 mg | Freq: Once | INTRAMUSCULAR | Status: AC

## 2020-12-30 MED ORDER — METHOCARBAMOL 500 MG PO TABS
500.0000 mg | ORAL_TABLET | Freq: Four times a day (QID) | ORAL | Status: DC | PRN
  Administered 2020-12-31 – 2021-01-01 (×4): 500 mg via ORAL
  Filled 2020-12-30 (×4): qty 1

## 2020-12-30 MED ORDER — CEFAZOLIN SODIUM-DEXTROSE 2-4 GM/100ML-% IV SOLN
2.0000 g | Freq: Four times a day (QID) | INTRAVENOUS | Status: AC
  Administered 2020-12-30 – 2020-12-31 (×2): 2 g via INTRAVENOUS
  Filled 2020-12-30 (×2): qty 100

## 2020-12-30 MED ORDER — FENTANYL CITRATE (PF) 100 MCG/2ML IJ SOLN
INTRAMUSCULAR | Status: AC
  Filled 2020-12-30: qty 2

## 2020-12-30 MED ORDER — MEPERIDINE HCL 25 MG/ML IJ SOLN: 6.2500 mg | INTRAMUSCULAR | Status: DC | PRN

## 2020-12-30 MED ORDER — BUPIVACAINE HCL (PF) 0.25 % IJ SOLN
INTRAMUSCULAR | Status: AC
  Filled 2020-12-30: qty 30

## 2020-12-30 MED ORDER — METOCLOPRAMIDE HCL 5 MG/ML IJ SOLN: 5.0000 mg | Freq: Three times a day (TID) | INTRAMUSCULAR | Status: DC | PRN

## 2020-12-30 MED ORDER — CLONIDINE HCL (ANALGESIA) 100 MCG/ML EP SOLN
EPIDURAL | Status: DC | PRN
  Administered 2020-12-30: 80 ug

## 2020-12-30 MED ORDER — MIDAZOLAM HCL 5 MG/5ML IJ SOLN
INTRAMUSCULAR | Status: DC | PRN
  Administered 2020-12-30 (×2): 1 mg via INTRAVENOUS

## 2020-12-30 SURGICAL SUPPLY — 79 items
BAG COUNTER SPONGE SURGICOUNT (BAG) ×2 IMPLANT
BAG DECANTER FOR FLEXI CONT (MISCELLANEOUS) IMPLANT
BANDAGE ESMARK 6X9 LF (GAUZE/BANDAGES/DRESSINGS) ×1 IMPLANT
BASEPLATE TIBIAL SZ2 TRI (Joint) ×1 IMPLANT
BLADE SAG 18X100X1.27 (BLADE) ×2 IMPLANT
BLADE SAGITTAL (BLADE) ×1
BLADE SAW THK.89X75X18XSGTL (BLADE) ×1 IMPLANT
BNDG COHESIVE 6X5 TAN STRL LF (GAUZE/BANDAGES/DRESSINGS) ×2 IMPLANT
BNDG ELASTIC 4X5.8 VLCR STR LF (GAUZE/BANDAGES/DRESSINGS) ×2 IMPLANT
BNDG ELASTIC 6X15 VLCR STRL LF (GAUZE/BANDAGES/DRESSINGS) ×2 IMPLANT
BNDG ELASTIC 6X5.8 VLCR STR LF (GAUZE/BANDAGES/DRESSINGS) ×2 IMPLANT
BNDG ESMARK 6X9 LF (GAUZE/BANDAGES/DRESSINGS) ×2
BOWL SMART MIX CTS (DISPOSABLE) IMPLANT
CNTNR URN SCR LID CUP LEK RST (MISCELLANEOUS) ×1 IMPLANT
COMP FEMORAL CEMNTLESS SZ2 TRI (Joint) ×2 IMPLANT
COMPONENT FEMRL CMNTLS SZ2 TRI (Joint) ×1 IMPLANT
CONT SPEC 4OZ STRL OR WHT (MISCELLANEOUS) ×1
COVER SURGICAL LIGHT HANDLE (MISCELLANEOUS) ×2 IMPLANT
CUFF TOURN SGL QUICK 34 (TOURNIQUET CUFF) ×1
CUFF TOURN SGL QUICK 42 (TOURNIQUET CUFF) IMPLANT
CUFF TRNQT CYL 34X4.125X (TOURNIQUET CUFF) ×1 IMPLANT
DECANTER SPIKE VIAL GLASS SM (MISCELLANEOUS) IMPLANT
DRAPE INCISE IOBAN 66X45 STRL (DRAPES) ×2 IMPLANT
DRAPE ORTHO SPLIT 77X108 STRL (DRAPES) ×3
DRAPE SURG ORHT 6 SPLT 77X108 (DRAPES) ×3 IMPLANT
DRAPE U-SHAPE 47X51 STRL (DRAPES) ×2 IMPLANT
DRSG AQUACEL AG ADV 3.5X10 (GAUZE/BANDAGES/DRESSINGS) IMPLANT
DRSG AQUACEL AG ADV 3.5X14 (GAUZE/BANDAGES/DRESSINGS) ×2 IMPLANT
DURAPREP 26ML APPLICATOR (WOUND CARE) ×4 IMPLANT
ELECT CAUTERY BLADE 6.4 (BLADE) ×2 IMPLANT
ELECT REM PT RETURN 9FT ADLT (ELECTROSURGICAL) ×2
ELECTRODE REM PT RTRN 9FT ADLT (ELECTROSURGICAL) ×1 IMPLANT
GAUZE SPONGE 4X4 12PLY STRL (GAUZE/BANDAGES/DRESSINGS) ×2 IMPLANT
GLOVE SRG 8 PF TXTR STRL LF DI (GLOVE) ×1 IMPLANT
GLOVE SURG LTX SZ7 (GLOVE) ×2 IMPLANT
GLOVE SURG LTX SZ8 (GLOVE) ×2 IMPLANT
GLOVE SURG UNDER POLY LF SZ7 (GLOVE) ×2 IMPLANT
GLOVE SURG UNDER POLY LF SZ8 (GLOVE) ×1
GOWN STRL REUS W/ TWL LRG LVL3 (GOWN DISPOSABLE) ×3 IMPLANT
GOWN STRL REUS W/TWL LRG LVL3 (GOWN DISPOSABLE) ×3
HANDPIECE INTERPULSE COAX TIP (DISPOSABLE) ×1
HOOD PEEL AWAY FLYTE STAYCOOL (MISCELLANEOUS) ×6 IMPLANT
IMMOBILIZER KNEE 20 (SOFTGOODS)
IMMOBILIZER KNEE 20 THIGH 36 (SOFTGOODS) IMPLANT
IMMOBILIZER KNEE 22 UNIV (SOFTGOODS) ×2 IMPLANT
IMMOBILIZER KNEE 24 THIGH 36 (MISCELLANEOUS) IMPLANT
IMMOBILIZER KNEE 24 UNIV (MISCELLANEOUS)
INSERT ART TIB TRTH CS SZ2 13 (Insert) ×2 IMPLANT
KIT BASIN OR (CUSTOM PROCEDURE TRAY) ×2 IMPLANT
KIT TURNOVER KIT B (KITS) ×2 IMPLANT
KNEE PATELLA ASYMMETRIC 9X29 (Knees) ×2 IMPLANT
MANIFOLD NEPTUNE II (INSTRUMENTS) ×2 IMPLANT
NEEDLE 22X1 1/2 (OR ONLY) (NEEDLE) ×2 IMPLANT
NEEDLE SPNL 18GX3.5 QUINCKE PK (NEEDLE) ×2 IMPLANT
NS IRRIG 1000ML POUR BTL (IV SOLUTION) ×4 IMPLANT
PACK TOTAL JOINT (CUSTOM PROCEDURE TRAY) ×2 IMPLANT
PAD ARMBOARD 7.5X6 YLW CONV (MISCELLANEOUS) ×4 IMPLANT
PAD CAST 4YDX4 CTTN HI CHSV (CAST SUPPLIES) ×1 IMPLANT
PADDING CAST COTTON 4X4 STRL (CAST SUPPLIES) ×1
PADDING CAST COTTON 6X4 STRL (CAST SUPPLIES) ×2 IMPLANT
PIN FLUTED HEDLESS FIX 3.5X1/8 (PIN) ×2 IMPLANT
SET HNDPC FAN SPRY TIP SCT (DISPOSABLE) ×1 IMPLANT
STRIP CLOSURE SKIN 1/2X4 (GAUZE/BANDAGES/DRESSINGS) ×2 IMPLANT
SUCTION FRAZIER HANDLE 10FR (MISCELLANEOUS) ×1
SUCTION TUBE FRAZIER 10FR DISP (MISCELLANEOUS) ×1 IMPLANT
SUT MNCRL AB 3-0 PS2 18 (SUTURE) ×2 IMPLANT
SUT VIC AB 0 CT1 27 (SUTURE) ×3
SUT VIC AB 0 CT1 27XBRD ANBCTR (SUTURE) ×3 IMPLANT
SUT VIC AB 1 CT1 36 (SUTURE) ×12 IMPLANT
SUT VIC AB 2-0 CT1 27 (SUTURE) ×4
SUT VIC AB 2-0 CT1 TAPERPNT 27 (SUTURE) ×4 IMPLANT
SYR 30ML LL (SYRINGE) ×6 IMPLANT
SYR TB 1ML LUER SLIP (SYRINGE) ×2 IMPLANT
TIBIAL BASEPLATE SZ2 TRI (Joint) ×2 IMPLANT
TOWEL GREEN STERILE (TOWEL DISPOSABLE) ×4 IMPLANT
TOWEL GREEN STERILE FF (TOWEL DISPOSABLE) ×4 IMPLANT
TRAY CATH 16FR W/PLASTIC CATH (SET/KITS/TRAYS/PACK) ×2 IMPLANT
WATER STERILE IRR 1000ML POUR (IV SOLUTION) IMPLANT
YANKAUER SUCT BULB TIP NO VENT (SUCTIONS) ×2 IMPLANT

## 2020-12-30 NOTE — H&P (Signed)
TOTAL KNEE ADMISSION H&P  Patient is being admitted for left total knee arthroplasty.  Subjective:  Chief Complaint:left knee pain.  HPI: [name], 60 y.o. female, has a history of pain and functional disability in the left knee due to arthritis and has failed non-surgical conservative treatments for greater than 12 weeks to includeNSAID's and/or analgesics, corticosteriod injections, flexibility and strengthening excercises, use of assistive devices, and activity modification.  Onset of symptoms was gradual, starting 5 years ago with gradually worsening course since that time. The patient noted no past surgery on the left knee(s).  Patient currently rates pain in the left knee(s) at 9 out of 10 with activity. Patient has night pain, worsening of pain with activity and weight bearing, pain that interferes with activities of daily living, pain with passive range of motion, crepitus, and joint swelling.  Patient has evidence of subchondral sclerosis and joint space narrowing by imaging studies. This patient has had  significant worsening of symptoms over the last several months.  She does have a history of psoriasis but takes no Biologics for that and her skin is intact over the knee at this time. . There is no active infection.  Patient Active Problem List   Diagnosis Date Noted   Psoriasis 06/03/2020   History of gastroesophageal reflux (GERD) 06/03/2020   Attention deficit hyperactivity disorder (ADHD), predominantly inattentive type 06/03/2020   History of depression 06/03/2020   Primary insomnia 06/03/2020   HTN (hypertension) 12/25/2017   Past Medical History:  Diagnosis Date   ADD (attention deficit disorder)    Arthritis    Depression    GERD (gastroesophageal reflux disease)    Hypertension    Psoriasis    Restless leg syndrome     Past Surgical History:  Procedure Laterality Date   CESAREAN SECTION     DILATION AND CURETTAGE OF UTERUS  1998   TONSILLECTOMY       Current Facility-Administered Medications  Medication Dose Route Frequency Provider Last Rate Last Admin   ceFAZolin (ANCEF) IVPB 2g/100 mL premix  2 g Intravenous On Call to OR Magnant, Charles L, PA-C       fentaNYL (SUBLIMAZE) 100 MCG/2ML injection            lactated ringers infusion   Intravenous Continuous Stoltzfus, Nelle Don, DO 10 mL/hr at 12/30/20 1041 New Bag at 12/30/20 1041   midazolam (VERSED) 2 MG/2ML injection            povidone-iodine (BETADINE) 7.5 % scrub   Topical Once Magnant, Charles L, PA-C       tranexamic acid (CYKLOKAPRON) 2,000 mg in sodium chloride 0.9 % 50 mL Topical Application  2,000 mg Topical To OR Cammy Copa, MD       tranexamic acid (CYKLOKAPRON) IVPB 1,000 mg  1,000 mg Intravenous To OR Magnant, Charles L, PA-C       No Known Allergies  Social History   Tobacco Use   Smoking status: Never   Smokeless tobacco: Never  Substance Use Topics   Alcohol use: Yes    Alcohol/week: 3.0 standard drinks    Types: 3 Glasses of wine per week    Comment: social    Family History  Problem Relation Age of Onset   Cancer Mother    Mental illness Father    Alcoholism Father    Mental illness Sister    Suicidality Sister    Healthy Son    Healthy Son    Healthy Daughter  Review of Systems  Musculoskeletal:  Positive for arthralgias.  All other systems reviewed and are negative.  Objective:  Physical Exam Vitals reviewed.  HENT:     Head: Normocephalic.     Nose: Nose normal.     Mouth/Throat:     Mouth: Mucous membranes are moist.  Eyes:     Pupils: Pupils are equal, round, and reactive to light.  Cardiovascular:     Rate and Rhythm: Normal rate.     Pulses: Normal pulses.  Pulmonary:     Effort: Pulmonary effort is normal.  Abdominal:     General: Abdomen is flat.  Musculoskeletal:     Cervical back: Normal range of motion.  Skin:    General: Skin is warm.  Neurological:     General: No focal deficit present.     Mental  Status: She is alert.  Psychiatric:        Mood and Affect: Mood normal.  Ortho exam demonstrates left knee range of motion from 5-1 05.  Collateral crucial ligaments are stable.  Slight varus alignment is present.  Pedal pulses are palpable.  Ankle dorsiflexion intact.  No groin pain on the left with internal ex rotation of the leg.  Skin is intact in the left knee region.  Vital signs in last 24 hours: Temp:  [97.6 F (36.4 C)] 97.6 F (36.4 C) (10/06 1018) Pulse Rate:  [92] 92 (10/06 1018) Resp:  [20] 20 (10/06 1018) BP: (142)/(92) 142/92 (10/06 1018) SpO2:  [95 %] 95 % (10/06 1018)  Labs:   Estimated body mass index is 38.33 kg/m as calculated from the following:   Height as of 12/28/20: 5' 3.5" (1.613 m).   Weight as of 12/28/20: 99.7 kg.   Imaging Review Plain radiographs demonstrate severe degenerative joint disease of the left knee(s). The overall alignment ismild varus. The bone quality appears to be good for age and reported activity level.      Assessment/Plan:  End stage arthritis, left knee   The patient history, physical examination, clinical judgment of the provider and imaging studies are consistent with end stage degenerative joint disease of the left knee(s) and total knee arthroplasty is deemed medically necessary. The treatment options including medical management, injection therapy arthroscopy and arthroplasty were discussed at length. The risks and benefits of total knee arthroplasty were presented and reviewed. The risks due to aseptic loosening, infection, stiffness, patella tracking problems, thromboembolic complications and other imponderables were discussed. The patient acknowledged the explanation, agreed to proceed with the plan and consent was signed. Patient is being admitted for inpatient treatment for surgery, pain control, PT, OT, prophylactic antibiotics, VTE prophylaxis, progressive ambulation and ADL's and discharge planning. The patient is  planning to be discharged home with home health services     Patient's anticipated LOS is less than 2 midnights, meeting these requirements: - Younger than 85 - Lives within 1 hour of care - Has a competent adult at home to recover with post-op recover - NO history of  - Chronic pain requiring opiods  - Diabetes  - Coronary Artery Disease  - Heart failure  - Heart attack  - Stroke  - DVT/VTE  - Cardiac arrhythmia  - Respiratory Failure/COPD  - Renal failure  - Anemia  - Advanced Liver disease

## 2020-12-30 NOTE — Anesthesia Procedure Notes (Signed)
Anesthesia Regional Block: Adductor canal block   Pre-Anesthetic Checklist: , timeout performed,  Correct Patient, Correct Site, Correct Laterality,  Correct Procedure, Correct Position, site marked,  Risks and benefits discussed,  Surgical consent,  Pre-op evaluation,  At surgeon's request and post-op pain management  Laterality: Lower and Left  Prep: chloraprep       Needles:  Injection technique: Single-shot  Needle Type: Stimiplex     Needle Length: 9cm  Needle Gauge: 21     Additional Needles:   Procedures:,,,, ultrasound used (permanent image in chart),,    Narrative:  Start time: 12/30/2020 10:49 AM End time: 12/30/2020 11:10 AM Injection made incrementally with aspirations every 5 mL.  Performed by: Personally  Anesthesiologist: Lewie Loron, MD  Additional Notes: BP cuff, EKG monitors applied. Sedation begun. Artery and nerve location verified with ultrasound. Anesthetic injected incrementally (46ml), slowly, and after negative aspirations under direct u/s guidance. Good fascial/perineural spread. Tolerated well.

## 2020-12-30 NOTE — Progress Notes (Signed)
Patient states that she would like to rest tonight and wait until morning to put CPM machine on. Asked if she would like bone foam in place and stated she doesn't need it at this time but would like to make a wedge support to place outside of knee. Will continue to monitor and follow up in am.

## 2020-12-30 NOTE — Anesthesia Procedure Notes (Signed)
Spinal  Patient location during procedure: OR Start time: 12/30/2020 12:25 PM End time: 12/30/2020 12:30 PM Reason for block: surgical anesthesia Staffing Performed: anesthesiologist  Anesthesiologist: Nolon Nations, MD Preanesthetic Checklist Completed: patient identified, IV checked, site marked, risks and benefits discussed, surgical consent, monitors and equipment checked, pre-op evaluation and timeout performed Spinal Block Prep: ChloraPrep and site prepped and draped Patient monitoring: heart rate, continuous pulse ox and blood pressure Approach: right paramedian Location: L3-4 Injection technique: single-shot Needle Needle type: Spinocan  Needle gauge: 25 G Needle length: 9 cm Assessment Events: CSF return Additional Notes Expiration date of kit checked and confirmed. Patient tolerated procedure well, without complications.

## 2020-12-30 NOTE — Anesthesia Procedure Notes (Signed)
Procedure Name: MAC Date/Time: 12/30/2020 12:24 PM Performed by: Michele Rockers, CRNA Pre-anesthesia Checklist: Patient identified, Emergency Drugs available, Suction available, Timeout performed and Patient being monitored Patient Re-evaluated:Patient Re-evaluated prior to induction Oxygen Delivery Method: Simple face mask

## 2020-12-30 NOTE — Transfer of Care (Signed)
Immediate Anesthesia Transfer of Care Note  Patient: [name]  Procedure(s) Performed: LEFT TOTAL KNEE ARTHROPLASTY (Left: Knee)  Patient Location: PACU  Anesthesia Type:MAC and Spinal  Level of Consciousness: awake, alert  and oriented  Airway & Oxygen Therapy: Patient Spontanous Breathing  Post-op Assessment: Report given to RN and Post -op Vital signs reviewed and stable  Post vital signs: Reviewed and stable  Last Vitals:  Vitals Value Taken Time  BP 121/72 12/30/20 1547  Temp    Pulse 87 12/30/20 1548  Resp 14 12/30/20 1548  SpO2 94 % 12/30/20 1548  Vitals shown include unvalidated device data.  Last Pain:  Vitals:   12/30/20 1018  TempSrc: Oral  PainSc:          Complications: No notable events documented.

## 2020-12-30 NOTE — Anesthesia Preprocedure Evaluation (Addendum)
Anesthesia Evaluation  Patient identified by MRN, date of birth, ID band Patient awake    Reviewed: Allergy & Precautions, NPO status , Patient's Chart, lab work & pertinent test results  Airway Mallampati: II  TM Distance: >3 FB Neck ROM: Full    Dental  (+) Dental Advisory Given, Teeth Intact   Pulmonary neg pulmonary ROS,    Pulmonary exam normal breath sounds clear to auscultation       Cardiovascular hypertension, Pt. on medications Normal cardiovascular exam Rhythm:Regular Rate:Normal     Neuro/Psych PSYCHIATRIC DISORDERS Depression negative neurological ROS     GI/Hepatic Neg liver ROS, GERD  ,  Endo/Other  negative endocrine ROS  Renal/GU negative Renal ROS     Musculoskeletal  (+) Arthritis ,   Abdominal (+) + obese,   Peds  Hematology negative hematology ROS (+)   Anesthesia Other Findings   Reproductive/Obstetrics                           Anesthesia Physical Anesthesia Plan  ASA: 3  Anesthesia Plan: Spinal   Post-op Pain Management:  Regional for Post-op pain   Induction: Intravenous  PONV Risk Score and Plan: 3 and Propofol infusion, TIVA, Treatment may vary due to age or medical condition, Midazolam and Ondansetron  Airway Management Planned: Natural Airway  Additional Equipment:   Intra-op Plan:   Post-operative Plan:   Informed Consent: I have reviewed the patients History and Physical, chart, labs and discussed the procedure including the risks, benefits and alternatives for the proposed anesthesia with the patient or authorized representative who has indicated his/her understanding and acceptance.     Dental advisory given  Plan Discussed with: CRNA  Anesthesia Plan Comments:        Anesthesia Quick Evaluation

## 2020-12-30 NOTE — Brief Op Note (Signed)
   12/30/2020  3:20 PM  PATIENT:  [name]  60 y.o. female  PRE-OPERATIVE DIAGNOSIS:  left knee osteoathritis  POST-OPERATIVE DIAGNOSIS:  left knee osteoathritis  PROCEDURE:  Procedure(s): LEFT TOTAL KNEE ARTHROPLASTY  SURGEON:  Surgeon(s): August Saucer, Corrie Mckusick, MD  ASSISTANT: magnant pa  ANESTHESIA:   spinal  EBL: 100 ml    Total I/O In: 1200 [I.V.:1000; IV Piggyback:200] Out: -   BLOOD ADMINISTERED: none  DRAINS: none   LOCAL MEDICATIONS USED:  marcaine mso4 clonidine exparel vanco powder  SPECIMEN:  No Specimen  COUNTS:  YES  TOURNIQUET:   Total Tourniquet Time Documented: Thigh (Left) - 86 minutes Total: Thigh (Left) - 86 minutes   DICTATION: .Other Dictation: Dictation Number [id number]  PLAN OF CARE: Admit for overnight observation  PATIENT DISPOSITION:  PACU - hemodynamically stable

## 2020-12-30 NOTE — Progress Notes (Signed)
Orthopedic Tech Progress Note Patient Details:  [name] Oct 03, 1960 [id number]  CPM Left Knee CPM Left Knee: On Left Knee Flexion (Degrees): 10 Left Knee Extension (Degrees): 40  Post Interventions Patient Tolerated: Well  Al Decant 12/30/2020, 4:27 PM

## 2020-12-31 ENCOUNTER — Other Ambulatory Visit (HOSPITAL_BASED_OUTPATIENT_CLINIC_OR_DEPARTMENT_OTHER): Payer: Self-pay

## 2020-12-31 ENCOUNTER — Other Ambulatory Visit (HOSPITAL_COMMUNITY): Payer: Self-pay

## 2020-12-31 DIAGNOSIS — M1712 Unilateral primary osteoarthritis, left knee: Secondary | ICD-10-CM | POA: Diagnosis not present

## 2020-12-31 MED ORDER — DOCUSATE SODIUM 100 MG PO CAPS
100.0000 mg | ORAL_CAPSULE | Freq: Two times a day (BID) | ORAL | 0 refills | Status: DC
  Filled 2020-12-31: qty 100, 50d supply, fill #0

## 2020-12-31 MED ORDER — KETOROLAC TROMETHAMINE 15 MG/ML IJ SOLN
15.0000 mg | Freq: Three times a day (TID) | INTRAMUSCULAR | Status: DC
  Administered 2020-12-31 – 2021-01-01 (×4): 15 mg via INTRAVENOUS
  Filled 2020-12-31 (×4): qty 1

## 2020-12-31 MED ORDER — METHOCARBAMOL 500 MG PO TABS
500.0000 mg | ORAL_TABLET | Freq: Four times a day (QID) | ORAL | 0 refills | Status: DC | PRN
  Filled 2020-12-31: qty 30, 8d supply, fill #0

## 2020-12-31 MED ORDER — GABAPENTIN 400 MG PO CAPS
400.0000 mg | ORAL_CAPSULE | Freq: Three times a day (TID) | ORAL | Status: DC
  Administered 2020-12-31 – 2021-01-01 (×4): 400 mg via ORAL
  Filled 2020-12-31 (×4): qty 1

## 2020-12-31 MED ORDER — ASPIRIN 81 MG PO CHEW
81.0000 mg | CHEWABLE_TABLET | Freq: Two times a day (BID) | ORAL | 0 refills | Status: DC
  Filled 2020-12-31: qty 36, 18d supply, fill #0

## 2020-12-31 MED ORDER — CHLORHEXIDINE GLUCONATE CLOTH 2 % EX PADS
6.0000 | MEDICATED_PAD | Freq: Every day | CUTANEOUS | Status: DC
  Administered 2020-12-31 – 2021-01-01 (×2): 6 via TOPICAL

## 2020-12-31 MED ORDER — ACETAMINOPHEN 325 MG PO TABS
325.0000 mg | ORAL_TABLET | Freq: Four times a day (QID) | ORAL | 0 refills | Status: DC | PRN
  Filled 2020-12-31: qty 100, 13d supply, fill #0

## 2020-12-31 MED ORDER — OXYCODONE HCL 5 MG PO TABS
5.0000 mg | ORAL_TABLET | ORAL | 0 refills | Status: DC | PRN
  Filled 2020-12-31: qty 30, 3d supply, fill #0

## 2020-12-31 NOTE — Evaluation (Signed)
Physical Therapy Evaluation Patient Details Name: [name] MRN: [id number] DOB: 12-Oct-1960 Today's Date: 12/31/2020  History of Present Illness  Pt is a 60 y.o. female s/p elective L TKA on 12/30/20. PMH includes HTN, ADD, arthritis, RLS, depression.  Clinical Impression  Pt presents with an overall decrease in functional mobility secondary to above. 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 Educ on precautions, positioning, therex, and importance of mobility. Today, pt able to initiate transfer training with RW and min guard; pt with very poor tolerance to L knee A/PROM or weight bearing, relying heavily on RW and RLE to hop/scoot to recliner once standing. Pt would benefit from continued acute PT services to maximize functional mobility and independence prior to d/c with HHPT services.      Recommendations for follow up therapy are one component of a multi-disciplinary discharge planning process, led by the attending physician.  Recommendations may be updated based on patient status, additional functional criteria and insurance authorization.  Follow Up Recommendations Follow surgeon's recommendation for DC plan and follow-up therapies    Equipment Recommendations  None recommended by PT    Recommendations for Other Services       Precautions / Restrictions Precautions Precautions: Fall;Knee Restrictions Weight Bearing Restrictions: No      Mobility  Bed Mobility Overal bed mobility: Needs Assistance Bed Mobility: Supine to Sit     Supine to sit: Min assist;HOB elevated     General bed mobility comments: MinA for LLE management secondary to pain    Transfers Overall transfer level: Needs assistance Equipment used: Rolling walker (2 wheeled) Transfers: Sit to/from Stand Sit to Stand: Min guard         General transfer comment: Educ on sequencing, hand placement with RW; significant increased time and effort, almost no weight going  through LLE to stand, close min guard for balance  Ambulation/Gait Ambulation/Gait assistance: Min guard Gait Distance (Feet): 1 Feet Assistive device: Rolling walker (2 wheeled) Gait Pattern/deviations: Step-to pattern;Antalgic;Trunk flexed Gait velocity: Decreased   General Gait Details: Pt initially attempting to hop on RLE with no weight through LLE, then treating LLE as TDWB having to scoot on R foot secondary to pain and fatigue with RW; min guard with 2x minA to prevent posterior LOB  Stairs            Wheelchair Mobility    Modified Rankin (Stroke Patients Only)       Balance Overall balance assessment: Needs assistance Sitting-balance support: No upper extremity supported;Feet supported Sitting balance-Leahy Scale: Good       Standing balance-Leahy Scale: Poor Standing balance comment: Heavy reliance on BUE support                             Pertinent Vitals/Pain Pain Assessment: Faces Faces Pain Scale: Hurts whole lot Pain Location: L knee Pain Descriptors / Indicators: Grimacing;Guarding;Crying Pain Intervention(s): Patient requesting pain meds-RN notified;Monitored during session;Limited activity within patient's tolerance;Ice applied    Home Living Family/patient expects to be discharged to:: Private residence Living Arrangements: Alone Available Help at Discharge: Family;Available PRN/intermittently Type of Home: Apartment Home Access: Level entry     Home Layout: One level Home Equipment: Walker - 2 wheels;Walker - 4 wheels;Grab bars - tub/shower Additional Comments: Son available for PRN assist    Prior Function Level of Independence: Independent         Comments: Independent, works as ER  nurse     Hand Dominance        Extremity/Trunk Assessment   Upper Extremity Assessment Upper Extremity Assessment: Overall WFL for tasks assessed    Lower Extremity Assessment Lower Extremity Assessment: LLE deficits/detail LLE  Deficits / Details: s/p L TKA; very limited knee ROM, difficult to fully assess secondary to pain, lacking significant extension but unable to tolerate CPM or bone foam LLE: Unable to fully assess due to pain;Unable to fully assess due to immobilization LLE Coordination: decreased gross motor;decreased fine motor       Communication   Communication: No difficulties  Cognition Arousal/Alertness: Awake/alert Behavior During Therapy: WFL for tasks assessed/performed;Anxious Overall Cognitive Status: Within Functional Limits for tasks assessed                                 General Comments: WFL for majority of tasks; distracted by anxiety related to pain      General Comments General comments (skin integrity, edema, etc.): Educ re: precautions, positioning, activity recommendations - especially importance of regaining A/PROM L knee and increasing WBAT    Exercises Other Exercises Other Exercises: Poor tolerance to L knee AROM flex/ext, let dangle at edge of recliner and bed but still lacking flexion to 90'   Assessment/Plan    PT Assessment Patient needs continued PT services  PT Problem List Decreased strength;Decreased range of motion;Decreased activity tolerance;Decreased balance;Decreased mobility;Decreased knowledge of use of DME;Decreased knowledge of precautions;Pain       PT Treatment Interventions DME instruction;Gait training;Functional mobility training;Therapeutic activities;Therapeutic exercise;Balance training;Patient/family education    PT Goals (Current goals can be found in the Care Plan section)  Acute Rehab PT Goals Patient Stated Goal: decreased pain PT Goal Formulation: With patient Time For Goal Achievement: 01/14/21 Potential to Achieve Goals: Good    Frequency 7X/week   Barriers to discharge Decreased caregiver support      Co-evaluation               AM-PAC PT "6 Clicks" Mobility  Outcome Measure Help needed turning from your  back to your side while in a flat bed without using bedrails?: A Little Help needed moving from lying on your back to sitting on the side of a flat bed without using bedrails?: A Little Help needed moving to and from a bed to a chair (including a wheelchair)?: A Little Help needed standing up from a chair using your arms (e.g., wheelchair or bedside chair)?: A Little Help needed to walk in hospital room?: A Little Help needed climbing 3-5 steps with a railing? : A Little 6 Click Score: 18    End of Session Equipment Utilized During Treatment: Gait belt Activity Tolerance: Patient limited by pain Patient left: in chair;with call bell/phone within reach;with chair alarm set;with nursing/sitter in room Nurse Communication: Mobility status PT Visit Diagnosis: Other abnormalities of gait and mobility (R26.89);Pain Pain - Right/Left: Left Pain - part of body: Knee    Time: 3825-0539 PT Time Calculation (min) (ACUTE ONLY): 30 min   Charges:   PT Evaluation $PT Eval Low Complexity: 1 Low PT Treatments $Therapeutic Activity: 8-22 mins      Ina Homes, PT, DPT Acute Rehabilitation Services  Pager [phone] Office [phone]  Malachy Chamber 12/31/2020, 12:47 PM

## 2020-12-31 NOTE — Anesthesia Postprocedure Evaluation (Signed)
Anesthesia Post Note  Patient: [name]  Procedure(s) Performed: LEFT TOTAL KNEE ARTHROPLASTY (Left: Knee)     Anesthesia Post Evaluation No notable events documented.  Last Vitals:  Vitals:   12/31/20 0758 12/31/20 1154  BP: 99/72 118/85  Pulse: 90 86  Resp: 20 18  Temp: (!) 36.3 C (!) 36.4 C  SpO2:  98%    Last Pain:  Vitals:   12/31/20 1154  TempSrc: Oral  PainSc:                  Lewie Loron

## 2020-12-31 NOTE — Progress Notes (Signed)
CPM device was placed from 0430 until 0630 am. Pt unable to place knee back in bone foam due to severe pain. Pt states that she just wants to leave leg sitting in CPM device in a slight bent position. Pt states that she will address with MD on morning rounds.

## 2020-12-31 NOTE — Op Note (Signed)
[name], [name] MEDICAL RECORD NO: [id number] ACCOUNT NO: [id number] DATE OF BIRTH: [birth year] FACILITY: [facility] LOCATION: [facility] PHYSICIAN: Graylin Shiver. August Saucer, MD  Operative Report   DATE OF PROCEDURE: 12/30/2020  PREOPERATIVE DIAGNOSIS:  Left knee arthritis.  POSTOPERATIVE DIAGNOSIS:  Left knee arthritis.  PROCEDURE:  Left total knee replacement using Stryker press-fit components, size 2 tibia, 13 mm deep dish polyethylene insert, posterior cruciate retaining with a size 2 femur and 29 press-fit patella.  Femur also press fit.  SURGEON:  Graylin Shiver. August Saucer, MD  ASSISTANT:  Karenann Cai, PA  INDICATIONS:  The patient is a 60 year old patient with end-stage left knee arthritis, who presents for operative management after explanation of risks and benefits.  DESCRIPTION OF PROCEDURE:  The patient was brought to the operating room where general anesthetic was induced.  Preoperative antibiotics were administered.  Timeout was called.  Left leg was pre-scrubbed with alcohol, Betadine, and allowed to air dry,  prepped with DuraPrep solution and draped in a sterile manner.  Ioban used to cover the operative field.  The leg was elevated and exsanguinated with the Esmarch wrap.  Tourniquet was inflated.  Timeout was called.  Anterior approach to knee was made.   Skin and subcutaneous tissue were sharply divided.  Median parapatellar approach was made and marked with #1 Vicryl suture.  Fat pad was partially excised.  Soft tissue dissection performed on the medial aspect of the knee to accommodate her preoperative  varus deformity.  Lateral patellofemoral ligament released.  Soft tissue removed from the anterior distal femur.  Patella was everted.  Severe tricompartmental arthritis was present, worse on the medial side.  Osteophytes removed. ACL and anterior horn  of the lateral meniscus resected.  Retractors were placed.  Intramedullary alignment was then used to make a cut perpendicular to the  mechanical axis 9 mm off the least affected lateral tibial plateau.  Collaterals and posterior neurovascular structures  were protected.  Next, an 8 mm cut was made in 5 degrees of valgus using intramedullary alignment.  This allowed 10 spacer block to sit within the knee.  There was slight hyperextension with a 10 spacer block.  Next, the femur was sized to a size 2.  Tibia was sized to a size 2.  Anterior, posterior and chamfer cuts were made after appropriate rotation to give symmetric flexion gap.  Collaterals were protected.  Next, the tibia was keeled punch.  Trial components placed.  The patella was then cut  down from 22 to 13 mm.  Three peg patellar trial was placed.  With the multiple trials were performed, the 13 spacer gave excellent flexion with no liftoff.  A very good stability to varus and valgus stress at 0, 30 and 90 degrees and about 2-3 degrees  of hyperextension.  This was the one that was chosen.  Trial components were removed.  Thorough irrigation was performed with 3 liters of irrigating solution.  Capsule was anesthetized using Marcaine, saline and Exparel.  Tranexamic acid sponge allowed  to sit for 3 minutes within the incision.  This was removed.  A true component was then tapped into position with excellent press-fit obtained.  Next, the patella tracked beautifully.  Tourniquet was released. Bleeding points encountered were controlled  using electrocautery. Pouring irrigation then utilized at this time.  Median parapatellar arthrotomy was closed over a bolster using #1 Vicryl suture.  The IrriSept solution used after the incision, after the arthrotomy and multiple times during the  case.  Next  IrriSept solution and vancomycin powder were then placed within the incision.  Vancomycin powder also placed within the tibial canal prior to implant placement.  The arthrotomy was then closed after putting in the IrriSept and then vancomycin  powder.  This was then injected with Marcaine,  morphine, clonidine.  The skin was then closed using interrupted inverted 0 Vicryl suture, 2-0 Vicryl suture, and 3-0 Monocryl.  Steri-Strips, Aquacel dressing and knee immobilizer applied.  The patient  tolerated the procedure well without immediate complication.  Iceman applied.  The patient was transferred to the recovery room in stable condition.  Luke's assistance was required at all times for opening, closing, retraction, mobilization of tissue.  His assistance was a medical necessity.    MUK D: 12/30/2020 3:26:00 pm T: 12/31/2020 1:42:00 am  JOB: [id number]/ [id number]

## 2020-12-31 NOTE — Progress Notes (Signed)
Physical Therapy Treatment Patient Details Name: [name] MRN: [id number] DOB: Feb 04, 1961 Today's Date: 12/31/2020   History of Present Illness Pt is a 60 y.o. female s/p elective L TKA on 12/30/20. PMH includes HTN, ADD, arthritis, RLS, depression.    PT Comments    Pt progressing towards goals and able to ambulate within the room, however, continues to be limited by pain. Limited weightshift to LLE during mobility tasks noted. Further reinforced knee precautions with pt. Current recommendations appropriate. Will continue to follow acutely.    Recommendations for follow up therapy are one component of a multi-disciplinary discharge planning process, led by the attending physician.  Recommendations may be updated based on patient status, additional functional criteria and insurance authorization.  Follow Up Recommendations  Follow surgeon's recommendation for DC plan and follow-up therapies     Equipment Recommendations  3in1 (PT)    Recommendations for Other Services OT consult     Precautions / Restrictions Precautions Precautions: Fall;Knee Precaution Booklet Issued: No Precaution Comments: Verbally reviewed knee precautions with pt. Restrictions Weight Bearing Restrictions: Yes LLE Weight Bearing: Weight bearing as tolerated     Mobility  Bed Mobility Overal bed mobility: Needs Assistance Bed Mobility: Sit to Supine       Sit to supine: Min assist   General bed mobility comments: Min A for LLE assist.    Transfers Overall transfer level: Needs assistance Equipment used: Rolling walker (2 wheeled) Transfers: Sit to/from Stand Sit to Stand: Min guard         General transfer comment: Min guard for safety. Cues for hand placement. Limited weightbearing on LLE.  Ambulation/Gait Ambulation/Gait assistance: Min guard Gait Distance (Feet): 20 Feet Assistive device: Rolling walker (2 wheeled) Gait Pattern/deviations: Step-to pattern;Antalgic;Trunk  flexed Gait velocity: Decreased   General Gait Details: Very antalgic gait. Walking on toes on L foot and very limited weightshift to LLE. Improved tolerance from morning session, however.   Stairs             Wheelchair Mobility    Modified Rankin (Stroke Patients Only)       Balance Overall balance assessment: Needs assistance Sitting-balance support: No upper extremity supported;Feet supported Sitting balance-Leahy Scale: Good     Standing balance support: Bilateral upper extremity supported Standing balance-Leahy Scale: Poor Standing balance comment: Heavy reliance on BUE support. LOB noted when attempting to stand without UE support during toileting tasks.                            Cognition Arousal/Alertness: Awake/alert Behavior During Therapy: WFL for tasks assessed/performed;Anxious Overall Cognitive Status: Within Functional Limits for tasks assessed                                        Exercises Total Joint Exercises Ankle Circles/Pumps: AROM;Both;10 reps Other Exercises Other Exercises: Poor tolerance to L knee AROM flex/ext, let dangle at edge of recliner and bed but still lacking flexion to 90'    General Comments        Pertinent Vitals/Pain Pain Assessment: Faces Faces Pain Scale: Hurts whole lot Pain Location: L knee Pain Descriptors / Indicators: Grimacing;Guarding;Crying Pain Intervention(s): Limited activity within patient's tolerance;Monitored during session;Repositioned    Home Living                      Prior  Function            PT Goals (current goals can now be found in the care plan section) Acute Rehab PT Goals Patient Stated Goal: decreased pain PT Goal Formulation: With patient Time For Goal Achievement: 01/14/21 Potential to Achieve Goals: Good Progress towards PT goals: Progressing toward goals    Frequency    7X/week      PT Plan Current plan remains appropriate     Co-evaluation              AM-PAC PT "6 Clicks" Mobility   Outcome Measure  Help needed turning from your back to your side while in a flat bed without using bedrails?: A Little Help needed moving from lying on your back to sitting on the side of a flat bed without using bedrails?: A Little Help needed moving to and from a bed to a chair (including a wheelchair)?: A Little Help needed standing up from a chair using your arms (e.g., wheelchair or bedside chair)?: A Little Help needed to walk in hospital room?: A Little Help needed climbing 3-5 steps with a railing? : A Lot 6 Click Score: 17    End of Session Equipment Utilized During Treatment: Gait belt Activity Tolerance: Patient limited by pain Patient left: in bed;with call bell/phone within reach Nurse Communication: Mobility status PT Visit Diagnosis: Other abnormalities of gait and mobility (R26.89);Pain Pain - Right/Left: Left Pain - part of body: Knee     Time: 2527-1292 PT Time Calculation (min) (ACUTE ONLY): 25 min  Charges:  $Gait Training: 8-22 mins $Therapeutic Activity: 8-22 mins                     Cindee Salt, DPT  Acute Rehabilitation Services  Pager: [phone] Office: [phone]    Lehman Prom 12/31/2020, 4:40 PM

## 2020-12-31 NOTE — Progress Notes (Signed)
  Subjective: [name] is a 60 y.o. female s/p left TKA.  They are POD 1.  Pt's pain is moderate but controlled.  Pt not ambulated yet.  She denies any fevers, chills, night sweats, shortness of breath, chest pain.  She does complain of severe pain with extension of her operative knee.  She has use a CPM machine for several hours this morning but tried the blue cradle boot with severe unrelenting pain.  Currently she appears comfortable at evaluation around 7:30 AM with knee flexed to about 40 degrees in the CPM machine..     Objective: Vital signs in last 24 hours: Temp:  [97.3 F (36.3 C)-97.8 F (36.6 C)] 97.5 F (36.4 C) (10/07 1154) Pulse Rate:  [66-94] 86 (10/07 1154) Resp:  [11-27] 18 (10/07 1154) BP: (99-126)/(49-85) 118/85 (10/07 1154) SpO2:  [90 %-100 %] 98 % (10/07 1154)  Intake/Output from previous day: 10/06 0701 - 10/07 0700 In: 1440 [P.O.:240; I.V.:1000; IV Piggyback:200] Out: 1900 [Urine:1850; Blood:50] Intake/Output this shift: No intake/output data recorded.  Exam:  No gross blood or drainage overlying the dressing 2+ DP pulse Sensation intact distally in the left foot Able to dorsiflex and plantarflex the left foot Able to perform straight leg raise with 20 to 30 degree extensor lag. No calf tenderness, negative Homans' sign.   Labs: No results for input(s): HGB in the last 72 hours. No results for input(s): WBC, RBC, HCT, PLT in the last 72 hours. No results for input(s): NA, K, CL, CO2, BUN, CREATININE, GLUCOSE, CALCIUM in the last 72 hours. No results for input(s): LABPT, INR in the last 72 hours.  Assessment/Plan: Pt is POD 1 s/p left TKA.    -Plan to discharge to home today or tomorrow pending patient's pain and PT eval  -WBAT with a walker  -Patient was very tearful when discussing her pain today and is very apprehensive about her ability to achieve full extension.  Discussed with her the required therapy in the next several months and after  lengthy discussion, she understands that she will have to put a lot of work in.  She understood that she would be in a lot of pain after surgery but did not quite understand how much pain it would be.  Plan to adjust her medications with increasing frequency of gabapentin and adding Toradol as well.  She is okay to put a pillow below her knee just today but no more starting tomorrow and she will have to start working more on extension tomorrow.  She understands that there will be pain involved with this and it may bring her to tears at some point.     Joycie Peek Hagop Mccollam 12/31/2020, 12:31 PM

## 2020-12-31 NOTE — TOC Initial Note (Addendum)
Transition of Care [facility]) - Initial/Assessment Note    Patient Details  Name: [name] MRN: [id number] Date of Birth: 08-Aug-1960  [facility] CM/SW Contact:    Kingsley Plan, RN Phone Number: 12/31/2020, 11:46 AM  Clinical Narrative:                  Spoke to patient at bedside.   Patient has Kathlene Cote . [facility] not in network with insurance, however [facility] is and accepted referral for HHPT. Patient aware    Patient has all DME, rolling walker, rolator, raise commode seat , bars in bathroom , canes. Supply of frozen meals.  She already arranged OP PT at [facility] to start January 25, 2021        Patient Goals and CMS Choice Patient states their goals for this hospitalization and ongoing recovery are:: to return to home CMS Medicare.gov Compare Post Acute Care list provided to:: Patient Choice offered to / list presented to : Patient  Expected Discharge Plan and Services     Discharge Planning Services: CM Consult Post Acute Care Choice: Home Health Living arrangements for the past 2 months: Single Family Home                 DME Arranged: N/A         HH Arranged: PT          Prior Living Arrangements/Services Living arrangements for the past 2 months: Single Family Home Lives with:: Self Patient language and need for interpreter reviewed:: Yes        Need for Family Participation in Patient Care: Yes (Comment) Care giver support system in place?: Yes (comment) Current home services: DME Criminal Activity/Legal Involvement Pertinent to Current Situation/Hospitalization: No - Comment as needed  Activities of Daily Living      Permission Sought/Granted   Permission granted to share information with : No              Emotional Assessment Appearance:: Appears stated age Attitude/Demeanor/Rapport: Engaged Affect (typically observed): Accepting Orientation: : Oriented to Self,  Oriented to Place, Oriented to  Time, Oriented to Situation Alcohol / Substance Use: Not Applicable Psych Involvement: No (comment)  Admission diagnosis:  S/P total knee arthroplasty, left [Z96.652] Patient Active Problem List   Diagnosis Date Noted   S/P total knee arthroplasty, left 12/30/2020   Psoriasis 06/03/2020   History of gastroesophageal reflux (GERD) 06/03/2020   Attention deficit hyperactivity disorder (ADHD), predominantly inattentive type 06/03/2020   History of depression 06/03/2020   Primary insomnia 06/03/2020   HTN (hypertension) 12/25/2017   PCP:  Esperanza Richters, PA-C Pharmacy:   [facility] [address], [address] [place] [address] Phone: [phone] Fax: [phone]  [facility] [address] [place] [address] Phone: [phone] Fax: [phone]     Social Determinants of Health (SDOH) Interventions    Readmission Risk Interventions No flowsheet data found.

## 2021-01-01 ENCOUNTER — Encounter (HOSPITAL_COMMUNITY): Payer: Self-pay | Admitting: Orthopedic Surgery

## 2021-01-01 DIAGNOSIS — M1712 Unilateral primary osteoarthritis, left knee: Secondary | ICD-10-CM | POA: Diagnosis not present

## 2021-01-01 MED ORDER — ASPIRIN 81 MG PO CHEW: 81.0000 mg | CHEWABLE_TABLET | Freq: Two times a day (BID) | ORAL | 0 refills | Status: DC

## 2021-01-01 MED ORDER — OXYCODONE HCL 5 MG PO TABS: 5.0000 mg | ORAL_TABLET | ORAL | 0 refills | Status: DC | PRN

## 2021-01-01 MED ORDER — METHOCARBAMOL 500 MG PO TABS: 500.0000 mg | ORAL_TABLET | Freq: Four times a day (QID) | ORAL | 0 refills | Status: DC | PRN

## 2021-01-01 MED ORDER — ACETAMINOPHEN 325 MG PO TABS: 325.0000 mg | ORAL_TABLET | Freq: Four times a day (QID) | ORAL | 0 refills | Status: DC | PRN

## 2021-01-01 NOTE — TOC Transition Note (Signed)
Transition of Care [facility]) - CM/SW Discharge Note   Patient Details  Name: [name] MRN: [id number] Date of Birth: [birth year]  Transition of Care [facility]) CM/SW Contact:  Lawerance Sabal, RN Phone Number: 01/01/2021, 3:13 PM   Clinical Narrative:    Patient requests RW. Ordered to be delivered to room asap by [employer]          Patient Goals and CMS Choice Patient states their goals for this hospitalization and ongoing recovery are:: to return to home CMS Medicare.gov Compare Post Acute Care list provided to:: Patient Choice offered to / list presented to : Patient  Discharge Placement                       Discharge Plan and Services   Discharge Planning Services: CM Consult Post Acute Care Choice: Home Health          DME Arranged: Walker rolling DME Agency: [facility] Date DME Agency Contacted: 01/01/21 Time DME Agency Contacted: [phone] Representative spoke with at DME Agency: [facility] Arranged: PT          Social Determinants of Health (SDOH) Interventions     Readmission Risk Interventions No flowsheet data found.

## 2021-01-01 NOTE — Evaluation (Signed)
Occupational Therapy Evaluation Patient Details Name: [name] MRN: [id number] DOB: Apr 03, 1960 Today's Date: 01/01/2021   History of Present Illness Pt is a 60 y.o. female s/p elective L TKA on 12/30/20. PMH includes HTN, ADD, arthritis, RLS, depression.   Clinical Impression   Pt at PLOF works as an Nutritional therapist. Pt was educated on how to complete LB ADLS as requiring min assist in session with LLE. Pt was able to compete transfers with min guard to supervision as increase in attempts to complete. Pt educated on compensations with pain when in standing with IADLS.  Pt currently with functional limitations due to the deficits listed below (see OT Problem List).  Pt will benefit from skilled OT to increase their safety and independence with ADL and functional mobility for ADL to facilitate discharge to venue listed below.        Recommendations for follow up therapy are one component of a multi-disciplinary discharge planning process, led by the attending physician.  Recommendations may be updated based on patient status, additional functional criteria and insurance authorization.   Follow Up Recommendations  Follow surgeon's recommendation for DC plan and follow-up therapies;Supervision - Intermittent    Equipment Recommendations   (long handle reacher and sponge, FW)    Recommendations for Other Services       Precautions / Restrictions Precautions Precautions: Fall;Knee Precaution Booklet Issued: No Restrictions Weight Bearing Restrictions: Yes LLE Weight Bearing: Weight bearing as tolerated      Mobility Bed Mobility Overal bed mobility: Needs Assistance Bed Mobility: Supine to Sit     Supine to sit: HOB elevated;Modified independent (Device/Increase time)          Transfers Overall transfer level: Needs assistance Equipment used: Rolling walker (2 wheeled) Transfers: Sit to/from Stand Sit to Stand: Min guard (then to supervision with increase transfers)               Balance Overall balance assessment: Needs assistance Sitting-balance support: No upper extremity supported;Feet supported Sitting balance-Leahy Scale: Good     Standing balance support: Bilateral upper extremity supported;During functional activity Standing balance-Leahy Scale: Fair Standing balance comment: uses UE for most of the time in standing                           ADL either performed or assessed with clinical judgement   ADL Overall ADL's : Needs assistance/impaired Eating/Feeding: Independent;Sitting   Grooming: Wash/dry hands;Wash/dry face;Sitting;Standing;Cueing for safety;Cueing for sequencing;Min guard   Upper Body Bathing: Modified independent;Sitting;Cueing for safety;Cueing for sequencing   Lower Body Bathing: Minimal assistance;Cueing for safety;Cueing for sequencing;Sit to/from stand   Upper Body Dressing : Supervision/safety;Cueing for safety;Cueing for sequencing;Sitting   Lower Body Dressing: Minimal assistance;Cueing for safety;Cueing for sequencing;Sit to/from stand   Toilet Transfer: Min guard;Cueing for safety;Cueing for sequencing   Toileting- Architect and Hygiene: Min guard;Cueing for safety;Cueing for sequencing;Sit to/from stand   Tub/ Shower Transfer: Minimal assistance;Cueing for safety;Cueing for sequencing;Shower seat   Functional mobility during ADLs: Min guard;Cueing for safety;Cueing for sequencing;Rolling walker       Vision         Perception     Praxis      Pertinent Vitals/Pain Pain Assessment: Faces Faces Pain Scale: Hurts little more Pain Location: L knee Pain Descriptors / Indicators: Grimacing;Guarding Pain Intervention(s): Limited activity within patient's tolerance;Monitored during session;Premedicated before session     Hand Dominance     Extremity/Trunk Assessment Upper Extremity Assessment  Upper Extremity Assessment: Overall WFL for tasks assessed   Lower Extremity  Assessment Lower Extremity Assessment: Defer to PT evaluation;LLE deficits/detail LLE Deficits / Details: s/p L TKA       Communication Communication Communication: No difficulties   Cognition Arousal/Alertness: Awake/alert Behavior During Therapy: WFL for tasks assessed/performed Overall Cognitive Status: Within Functional Limits for tasks assessed                                     General Comments       Exercises     Shoulder Instructions      Home Living Family/patient expects to be discharged to:: Private residence Living Arrangements: Alone Available Help at Discharge: Family;Available PRN/intermittently Type of Home: Apartment Home Access: Level entry     Home Layout: One level     Bathroom Shower/Tub: Chief Strategy Officer: Handicapped height Bathroom Accessibility: Yes   Home Equipment: Walker - 4 wheels;Grab bars - tub/shower;Shower seat   Additional Comments: Son available for PRN assist      Prior Functioning/Environment Level of Independence: Independent        Comments: Independent, works as Radiographer, therapeutic Problem List: Decreased strength;Decreased range of motion;Decreased activity tolerance;Impaired balance (sitting and/or standing);Decreased knowledge of use of DME or AE;Pain      OT Treatment/Interventions: Self-care/ADL training;Therapeutic exercise;DME and/or AE instruction;Therapeutic activities;Patient/family education;Balance training    OT Goals(Current goals can be found in the care plan section) Acute Rehab OT Goals 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 OT Goal Formulation: With patient Time For Goal Achievement: 01/15/21 Potential to Achieve Goals: Good ADL Goals Pt Will Perform Lower Body Bathing: with modified independence;sit to/from stand Pt Will Perform Lower Body Dressing: with modified independence;sit to/from stand Pt Will Perform Tub/Shower Transfer: Tub  transfer;with modified independence;shower seat;ambulating;grab bars;rolling walker  OT Frequency: Min 2X/week   Barriers to D/C:            Co-evaluation              AM-PAC OT "6 Clicks" Daily Activity     Outcome Measure Help from another person eating meals?: None Help from another person taking care of personal grooming?: None Help from another person toileting, which includes using toliet, bedpan, or urinal?: A Little Help from another person bathing (including washing, rinsing, drying)?: A Little Help from another person to put on and taking off regular upper body clothing?: None Help from another person to put on and taking off regular lower body clothing?: A Little 6 Click Score: 21   End of Session Equipment Utilized During Treatment: Gait belt CPM Left Knee CPM Left Knee: Off  Activity Tolerance: Patient tolerated treatment well Patient left: Other (comment);in chair (with Physical THerapist)  OT Visit Diagnosis: Unsteadiness on feet (R26.81);Other abnormalities of gait and mobility (R26.89);Muscle weakness (generalized) (M62.81);Pain Pain - Right/Left: Left Pain - part of body: Knee                Time: 1962-2297 OT Time Calculation (min): 23 min Charges:  OT General Charges $OT Visit: 1 Visit OT Evaluation $OT Eval Low Complexity: 1 Low OT Treatments $Self Care/Home Management : 8-22 mins  Alphia Moh OTR/L  Acute Rehab Services  [phone] office number [phone] pager number   Alphia Moh 01/01/2021, 10:53 AM

## 2021-01-01 NOTE — Progress Notes (Signed)
Physical Therapy Treatment Patient Details Name: [name] MRN: [id number] DOB: 26-Oct-1960 Today's Date: 01/01/2021   History of Present Illness Pt is a 60 y.o. female s/p elective L TKA on 12/30/20. PMH includes HTN, ADD, arthritis, RLS, depression.    PT Comments    Continuing work on functional mobility and activity tolerance;  Excellent progress today with much more tolerance of bearing weight on L knee; able to walk more-than-adequate distance for dc home, and good rise with transfers; we discussed car transfers and pt is aware of plan for dc home with HHPT follow up;   Has 4 wheeled RW -- recommend 2 wheeled RW for more stability with amb;   OK for dc home from PT standpoint    Recommendations for follow up therapy are one component of a multi-disciplinary discharge planning process, led by the attending physician.  Recommendations may be updated based on patient status, additional functional criteria and insurance authorization.  Follow Up Recommendations  Follow surgeon's recommendation for DC plan and follow-up therapies     Equipment Recommendations  Rolling walker with 5" wheels;3in1 (PT);Other (comment) (Has 4 wheeled RW, needs regular RW with 2 wheels on the front)    Recommendations for Other Services       Precautions / Restrictions Precautions Precautions: Fall;Knee Precaution Comments: Verbally reviewed knee precautions with pt. Restrictions LLE Weight Bearing: Weight bearing as tolerated     Mobility  Bed Mobility Overal bed mobility: Needs Assistance Bed Mobility: Sit to Supine       Sit to supine: Supervision   General bed mobility comments: used gait belt as leg lifter to get into bed    Transfers Overall transfer level: Needs assistance Equipment used: Rolling walker (2 wheeled) Transfers: Sit to/from Stand Sit to Stand: Min guard (without physical contact)         General transfer comment: cues for positioning; minguard for safety;  no physical assist needed  Ambulation/Gait Ambulation/Gait assistance: Min guard (without physical contact) Gait Distance (Feet): 300 Feet Assistive device: Rolling walker (2 wheeled) Gait Pattern/deviations: Step-through pattern     General Gait Details: Cues for sequence, RW positioning, and to activate quad in stance for knee stability; no buckling, and tolerating more weight on LLE in stance well   Stairs         General stair comments: we discussed sequence; pt has no steps to enter her home   Wheelchair Mobility    Modified Rankin (Stroke Patients Only)       Balance     Sitting balance-Leahy Scale: Good       Standing balance-Leahy Scale: Fair Standing balance comment: uses UE for most of the time in standing                            Cognition Arousal/Alertness: Awake/alert Behavior During Therapy: WFL for tasks assessed/performed Overall Cognitive Status: Within Functional Limits for tasks assessed                                 General Comments: WFL for majority of tasks; distracted by anxiety related to pain      Exercises Total Joint Exercises Quad Sets: AROM;Left;5 reps Straight Leg Raises: AROM;Left;10 reps Goniometric ROM: 0-75    General Comments General comments (skin integrity, edema, etc.): Took time to reinforce no pilow under knee for optimal healing  Pertinent Vitals/Pain Pain Assessment: Faces Faces Pain Scale: Hurts a little bit Pain Location: L knee Pain Descriptors / Indicators: Grimacing;Guarding Pain Intervention(s): Monitored during session    Home Living                      Prior Function            PT Goals (current goals can now be found in the care plan section) Acute Rehab PT Goals 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 PT Goal Formulation: With patient Time For Goal Achievement: 01/14/21 Potential to Achieve Goals: Good Progress towards PT goals:  Progressing toward goals    Frequency    7X/week      PT Plan Current plan remains appropriate    Co-evaluation              AM-PAC PT "6 Clicks" Mobility   Outcome Measure  Help needed turning from your back to your side while in a flat bed without using bedrails?: None Help needed moving from lying on your back to sitting on the side of a flat bed without using bedrails?: None Help needed moving to and from a bed to a chair (including a wheelchair)?: None Help needed standing up from a chair using your arms (e.g., wheelchair or bedside chair)?: A Little Help needed to walk in hospital room?: A Little Help needed climbing 3-5 steps with a railing? : A Little 6 Click Score: 21    End of Session Equipment Utilized During Treatment: Gait belt Activity Tolerance: Patient tolerated treatment well Patient left: in bed;in CPM;with call bell/phone within reach Nurse Communication: Mobility status PT Visit Diagnosis: Other abnormalities of gait and mobility (R26.89);Pain Pain - Right/Left: Left Pain - part of body: Knee     Time: 2330-0762 PT Time Calculation (min) (ACUTE ONLY): 34 min  Charges:  $Gait Training: 23-37 mins                     [name], [place]  Acute Rehabilitation Services Pager [phone] Office [phone]    [name] 01/01/2021, 3:01 PM

## 2021-01-01 NOTE — Plan of Care (Signed)

## 2021-01-01 NOTE — Progress Notes (Signed)
  Subjective: Patient stable.  Feels significantly better today than yesterday   Objective: Vital signs in last 24 hours: Temp:  [97.3 F (36.3 C)-99.1 F (37.3 C)] 99.1 F (37.3 C) (10/08 0451) Pulse Rate:  [85-100] 100 (10/08 0451) Resp:  [16-20] 17 (10/08 0451) BP: (94-145)/(61-85) 102/61 (10/08 0451) SpO2:  [95 %-99 %] 95 % (10/08 0451)  Intake/Output from previous day: 10/07 0701 - 10/08 0700 In: 1436.6 [P.O.:240; I.V.:1196.6] Out: -  Intake/Output this shift: No intake/output data recorded.  Exam:  Dorsiflexion intact.  Left foot perfused.  Dressing intact.  Labs: No results for input(s): HGB in the last 72 hours. No results for input(s): WBC, RBC, HCT, PLT in the last 72 hours. No results for input(s): NA, K, CL, CO2, BUN, CREATININE, GLUCOSE, CALCIUM in the last 72 hours. No results for input(s): LABPT, INR in the last 72 hours.  Assessment/Plan: Plan at this time is discharge to home.  Medication pharmacy is changed.  Okay for discharge this afternoon after morning physical therapy.   G Scott Stacye Noori 01/01/2021, 7:30 AM

## 2021-01-03 ENCOUNTER — Other Ambulatory Visit (HOSPITAL_BASED_OUTPATIENT_CLINIC_OR_DEPARTMENT_OTHER): Payer: Self-pay

## 2021-01-04 ENCOUNTER — Telehealth: Payer: Self-pay | Admitting: Orthopedic Surgery

## 2021-01-04 ENCOUNTER — Telehealth: Payer: Self-pay

## 2021-01-04 NOTE — Telephone Encounter (Signed)
[name] with [facility] called with PT verbal orders. The frequency was three times a week for 1 week then twice a week for 2 weeks.   [name] CB# [phone]

## 2021-01-04 NOTE — Telephone Encounter (Signed)
Please see message below for requested verbal orders. Patient is s/p L TKA preformed on 12/30/2020

## 2021-01-04 NOTE — Telephone Encounter (Signed)
Transition Care Management Unsuccessful Follow-up Telephone Call  Date of discharge and from where:  [facility] 12/30/20-01/01/21  Attempts:  1st Attempt  Reason for unsuccessful TCM follow-up call:  No answer/busy  Jodelle Gross, RN, BSN, CCM Care Management Coordinator [facility] Phone: [phone] / Fax: [phone]

## 2021-01-05 ENCOUNTER — Telehealth: Payer: Self-pay

## 2021-01-05 NOTE — Telephone Encounter (Signed)
Transition Care Management Unsuccessful Follow-up Telephone Call  Date of discharge and from where:  01/01/2021 [facility]  Attempts:  2nd Attempt  Reason for unsuccessful TCM follow-up call:  No answer/busy   Rowe Pavy, RN, BSN, CEN [facility] Coordinator [phone]

## 2021-01-06 ENCOUNTER — Telehealth: Payer: Self-pay | Admitting: Orthopedic Surgery

## 2021-01-06 NOTE — Telephone Encounter (Signed)
[name] with [facility] called stating the pt declined all her PT visits this week. The pt told [name] she's been doing her exercises and she doing better; the pt states she will wait to her appt with Dr.Dean on  01/12/21 to decide if she will continue.   [name] CB#[phone]

## 2021-01-06 NOTE — Telephone Encounter (Signed)
Noted. Dr August Saucer aware

## 2021-01-08 DIAGNOSIS — M1712 Unilateral primary osteoarthritis, left knee: Secondary | ICD-10-CM

## 2021-01-09 ENCOUNTER — Other Ambulatory Visit: Payer: Self-pay | Admitting: Medical

## 2021-01-09 NOTE — Telephone Encounter (Signed)
Yeah that should be okay

## 2021-01-10 ENCOUNTER — Telehealth: Payer: Self-pay

## 2021-01-10 ENCOUNTER — Other Ambulatory Visit (HOSPITAL_BASED_OUTPATIENT_CLINIC_OR_DEPARTMENT_OTHER): Payer: Self-pay

## 2021-01-10 MED ORDER — GABAPENTIN 400 MG PO CAPS
400.0000 mg | ORAL_CAPSULE | Freq: Every day | ORAL | 0 refills | Status: DC
  Filled 2021-01-10: qty 30, 30d supply, fill #0

## 2021-01-10 NOTE — Telephone Encounter (Signed)
Attempted to contact Darnelle physical therapist however had to leave voicemail informing her that requested orders were fine for her to complete.

## 2021-01-10 NOTE — Telephone Encounter (Signed)
Received fax from UR nurse at [facility] requesting a post op update on pt. Pts first post op appt is not until 01/12/21, Requesting last office note, x-rays, labs, reports, med list, and current treatment plan. They have all office notes up until this point because they were faxed in for surgery auth. I faxed the 12/30/20 op note and wrote on cover pts post op appt info. Faxed to [phone]

## 2021-01-12 ENCOUNTER — Encounter: Payer: No Typology Code available for payment source | Admitting: Orthopedic Surgery

## 2021-01-12 NOTE — Discharge Summary (Signed)
Physician Discharge Summary      Patient ID: TANJA GIFT MRN: [id number] DOB/AGE: December 24, 1960 60 y.o.  Admit date: 12/30/2020 Discharge date: 01/01/2021  Admission Diagnoses:  Active Problems:   S/P total knee arthroplasty, left   Arthritis of left knee   Discharge Diagnoses:  Same  Surgeries: Procedure(s): LEFT TOTAL KNEE ARTHROPLASTY on 12/30/2020   Consultants:   Discharged Condition: Stable  Hospital Course: MAUREENA DABBS is an 60 y.o. female who was admitted 12/30/2020 with a chief complaint of left knee pain, and found to have a diagnosis of left knee osteoarthritis.  They were brought to the operating room on 12/30/2020 and underwent the above named procedures.  Pt awoke from anesthesia without complication and was transferred to the floor. On POD1, patient had moderate but controlled pain.  She is very tearful and apprehensive about the coming therapy over the next several months but after discussion, she felt more relaxed and felt more mentally prepared for physical therapy sessions on POD1.  She had marked improvement from POD1 to POD 2 in terms of her pain control and mobility; she ambulated 300 feet that day.  She was discharged home on POD 2..  Pt will f/u with Dr. August Saucer in clinic in ~2 weeks.   Antibiotics given:  Anti-infectives (From admission, onward)    Start     Dose/Rate Route Frequency Ordered Stop   12/30/20 1915  ceFAZolin (ANCEF) IVPB 2g/100 mL premix        2 g 200 mL/hr over 30 Minutes Intravenous Every 6 hours 12/30/20 1821 12/31/20 0447   12/30/20 1402  vancomycin (VANCOCIN) powder  Status:  Discontinued          As needed 12/30/20 1402 12/30/20 1540   12/30/20 1000  ceFAZolin (ANCEF) IVPB 2g/100 mL premix        2 g 200 mL/hr over 30 Minutes Intravenous On call to O.R. 12/30/20 8099 12/30/20 1235     .  Recent vital signs:  Vitals:   01/01/21 0451 01/01/21 0819  BP: 102/61 121/87  Pulse: 100 (!) 102  Resp: 17 18  Temp: 99.1 F (37.3 C)  98.8 F (37.1 C)  SpO2: 95% 95%    Recent laboratory studies:  Results for orders placed or performed during the hospital encounter of 12/28/20  Urine Culture   Specimen: Urine, Clean Catch  Result Value Ref Range   Specimen Description URINE, CLEAN CATCH    Special Requests      NONE Performed at [facility], [address], [place], [place] [address]    Culture MULTIPLE SPECIES PRESENT, SUGGEST RECOLLECTION (A)    Report Status 12/29/2020 FINAL   Surgical pcr screen   Specimen: Nasal Mucosa; Nasal Swab  Result Value Ref Range   MRSA, PCR NEGATIVE NEGATIVE   Staphylococcus aureus NEGATIVE NEGATIVE  SARS CORONAVIRUS 2 (TAT 6-24 HRS) Nasopharyngeal Nasopharyngeal Swab   Specimen: Nasopharyngeal Swab  Result Value Ref Range   SARS Coronavirus 2 NEGATIVE NEGATIVE  CBC  Result Value Ref Range   WBC 9.8 4.0 - 10.5 K/uL   RBC 4.79 3.87 - 5.11 MIL/uL   Hemoglobin 15.4 (H) 12.0 - 15.0 g/dL   HCT 50.5 39.7 - 67.3 %   MCV 92.9 80.0 - 100.0 fL   MCH 32.2 26.0 - 34.0 pg   MCHC 34.6 30.0 - 36.0 g/dL   RDW 41.9 37.9 - 02.4 %   Platelets 356 150 - 400 K/uL   nRBC 0.0 0.0 -  0.2 %  Basic metabolic panel  Result Value Ref Range   Sodium 139 135 - 145 mmol/L   Potassium 3.3 (L) 3.5 - 5.1 mmol/L   Chloride 100 98 - 111 mmol/L   CO2 30 22 - 32 mmol/L   Glucose, Bld 118 (H) 70 - 99 mg/dL   BUN 16 6 - 20 mg/dL   Creatinine, Ser 9.02 0.44 - 1.00 mg/dL   Calcium 9.1 8.9 - 40.9 mg/dL   GFR, Estimated >73 >53 mL/min   Anion gap 9 5 - 15  Urinalysis, Routine w reflex microscopic Urine, Clean Catch  Result Value Ref Range   Color, Urine YELLOW YELLOW   APPearance HAZY (A) CLEAR   Specific Gravity, Urine 1.025 1.005 - 1.030   pH 5.0 5.0 - 8.0   Glucose, UA NEGATIVE NEGATIVE mg/dL   Hgb urine dipstick MODERATE (A) NEGATIVE   Bilirubin Urine NEGATIVE NEGATIVE   Ketones, ur NEGATIVE NEGATIVE mg/dL   Protein, ur NEGATIVE NEGATIVE mg/dL   Nitrite NEGATIVE NEGATIVE   Leukocytes,Ua  NEGATIVE NEGATIVE   RBC / HPF 0-5 0 - 5 RBC/hpf   WBC, UA 0-5 0 - 5 WBC/hpf   Bacteria, UA RARE (A) NONE SEEN   Squamous Epithelial / LPF 6-10 0 - 5   Mucus PRESENT     Discharge Medications:   Allergies as of 01/01/2021   No Known Allergies      Medication List     STOP taking these medications    diclofenac Sodium 1 % Gel Commonly known as: VOLTAREN   fluconazole 150 MG tablet Commonly known as: DIFLUCAN   ketoconazole 2 % shampoo Commonly known as: NIZORAL   Pennsaid 2 % Soln Generic drug: Diclofenac Sodium   traZODone 50 MG tablet Commonly known as: DESYREL   Vitamin D3 125 MCG (5000 UT) Caps   Zoster Vaccine Adjuvanted injection Commonly known as: SHINGRIX       TAKE these medications    acetaminophen 325 MG tablet Commonly known as: TYLENOL Take 1-2 tablets (325-650 mg total) by mouth every 6 (six) hours as needed for mild pain (pain score 1-3 or temp > 100.5). What changed:  medication strength how much to take reasons to take this   aspirin 81 MG chewable tablet Chew 1 tablet (81 mg total) by mouth 2 (two) times daily.   chlorthalidone 25 MG tablet Commonly known as: HYGROTON TAKE 1 TABLET BY MOUTH ONCE DAILY   clobetasol 0.05 % external solution Commonly known as: TEMOVATE APPLY TO THE AFFECTED AREAS OF THE SCALP 2 TIMES DAILY AS NEEDED   desvenlafaxine 50 MG 24 hr tablet Commonly known as: PRISTIQ TAKE 1 TABLET (50 MG TOTAL) BY MOUTH DAILY.   docusate sodium 100 MG capsule Commonly known as: COLACE Take 1 capsule (100 mg total) by mouth 2 (two) times daily.   ibuprofen 200 MG tablet Commonly known as: ADVIL Take 800 mg by mouth every 6 (six) hours as needed for moderate pain or headache.   methocarbamol 500 MG tablet Commonly known as: ROBAXIN Take 1 tablet (500 mg total) by mouth every 6 (six) hours as needed for muscle spasms.   olmesartan 20 MG tablet Commonly known as: BENICAR Take 1 tablet (20 mg total) by mouth daily.    omeprazole 20 MG capsule Commonly known as: PRILOSEC TAKE 1 CAPSULE (20 MG TOTAL) BY MOUTH DAILY.   Otezla 30 MG Tabs Generic drug: Apremilast Take 1 tablet by mouth twice a day with meals  oxyCODONE 5 MG immediate release tablet Commonly known as: Oxy IR/ROXICODONE Take 1-2 tablets (5-10 mg total) by mouth every 4 (four) hours as needed for moderate pain (pain score 4-6).   potassium chloride 10 MEQ tablet Commonly known as: KLOR-CON TKAE 1 TABLET BY MOUTH ON MONDAY, WEDNESDAY AND FRIDAY   Vyvanse 50 MG capsule Generic drug: lisdexamfetamine Take 1 capsule (50 mg total) by mouth daily.        Diagnostic Studies: No results found.  Disposition: Discharge disposition: 01-Home or Self Care       Discharge Instructions     Call MD / Call 911   Complete by: As directed    If you experience chest pain or shortness of breath, CALL 911 and be transported to the hospital emergency room.  If you develope a fever above 101 F, pus (white drainage) or increased drainage or redness at the wound, or calf pain, call your surgeon's office.   Constipation Prevention   Complete by: As directed    Drink plenty of fluids.  Prune juice may be helpful.  You may use a stool softener, such as Colace (over the counter) 100 mg twice a day.  Use MiraLax (over the counter) for constipation as needed.   Diet - low sodium heart healthy   Complete by: As directed    Discharge instructions   Complete by: As directed    Okay for CPM 1 hour 3 times a day Weightbearing as tolerated with crutches or walker Okay to shower dressing is waterproof   Increase activity slowly as tolerated   Complete by: As directed    Post-operative opioid taper instructions:   Complete by: As directed    POST-OPERATIVE OPIOID TAPER INSTRUCTIONS: It is important to wean off of your opioid medication as soon as possible. If you do not need pain medication after your surgery it is ok to stop day one. Opioids  include: Codeine, Hydrocodone(Norco, Vicodin), Oxycodone(Percocet, oxycontin) and hydromorphone amongst others.  Long term and even short term use of opiods can cause: Increased pain response Dependence Constipation Depression Respiratory depression And more.  Withdrawal symptoms can include Flu like symptoms Nausea, vomiting And more Techniques to manage these symptoms Hydrate well Eat regular healthy meals Stay active Use relaxation techniques(deep breathing, meditating, yoga) Do Not substitute Alcohol to help with tapering If you have been on opioids for less than two weeks and do not have pain than it is ok to stop all together.  Plan to wean off of opioids This plan should start within one week post op of your joint replacement. Maintain the same interval or time between taking each dose and first decrease the dose.  Cut the total daily intake of opioids by one tablet each day Next start to increase the time between doses. The last dose that should be eliminated is the evening dose.             Signed: Julieanne Cotton 01/12/2021, 10:30 PM

## 2021-01-13 ENCOUNTER — Encounter: Payer: No Typology Code available for payment source | Admitting: Orthopedic Surgery

## 2021-01-13 ENCOUNTER — Other Ambulatory Visit: Payer: Self-pay | Admitting: Medical

## 2021-01-13 MED ORDER — LISDEXAMFETAMINE DIMESYLATE 50 MG PO CAPS
50.0000 mg | ORAL_CAPSULE | Freq: Every day | ORAL | 0 refills | Status: DC
  Filled 2021-01-13: qty 30, 30d supply, fill #0

## 2021-01-13 NOTE — Telephone Encounter (Addendum)
Requesting: vyvanse Contract:09/29/20 UDS:09/29/20 Last Visit:09/29/20 Next Visit:n/a Last Refill:12/13/2020  Please Advise   Refill sent. Have her get scheuled for follow up within next 4 weeks before next refill due.  Esperanza Richters, PA-C

## 2021-01-14 ENCOUNTER — Telehealth: Payer: Self-pay | Admitting: Orthopedic Surgery

## 2021-01-14 ENCOUNTER — Other Ambulatory Visit (HOSPITAL_BASED_OUTPATIENT_CLINIC_OR_DEPARTMENT_OTHER): Payer: Self-pay

## 2021-01-14 NOTE — Telephone Encounter (Signed)
Pt is not returning messages for home health. She has missed all her appts with them except one appt.   CB [phone]

## 2021-01-14 NOTE — Telephone Encounter (Signed)
Please see telephone note from 9/16 , you stated its okay for patient to come in around November since she just had TKA.

## 2021-01-15 ENCOUNTER — Encounter: Payer: Self-pay | Admitting: Medical

## 2021-01-15 NOTE — Telephone Encounter (Signed)
hmmm

## 2021-01-17 NOTE — Telephone Encounter (Signed)
Patient stated she will make a follow up appointment after her post-op appt on 10/31

## 2021-01-21 ENCOUNTER — Other Ambulatory Visit (HOSPITAL_COMMUNITY): Payer: Self-pay

## 2021-01-24 ENCOUNTER — Encounter: Payer: No Typology Code available for payment source | Admitting: Orthopedic Surgery

## 2021-01-24 ENCOUNTER — Other Ambulatory Visit (HOSPITAL_COMMUNITY): Payer: Self-pay

## 2021-01-25 ENCOUNTER — Ambulatory Visit: Payer: No Typology Code available for payment source | Admitting: Physical Therapy

## 2021-01-25 ENCOUNTER — Other Ambulatory Visit (HOSPITAL_COMMUNITY): Payer: Self-pay

## 2021-01-26 ENCOUNTER — Other Ambulatory Visit: Payer: Self-pay

## 2021-01-26 ENCOUNTER — Ambulatory Visit: Payer: No Typology Code available for payment source | Admitting: Medical

## 2021-01-27 ENCOUNTER — Ambulatory Visit: Payer: No Typology Code available for payment source

## 2021-01-29 ENCOUNTER — Other Ambulatory Visit: Payer: Self-pay | Admitting: Medical

## 2021-01-29 MED FILL — Clobetasol Propionate Soln 0.05%: CUTANEOUS | 30 days supply | Qty: 50 | Fill #2 | Status: AC

## 2021-01-29 MED FILL — Potassium Chloride Tab ER 10 mEq: ORAL | 84 days supply | Qty: 36 | Fill #1 | Status: AC

## 2021-01-31 ENCOUNTER — Other Ambulatory Visit (HOSPITAL_BASED_OUTPATIENT_CLINIC_OR_DEPARTMENT_OTHER): Payer: Self-pay

## 2021-01-31 ENCOUNTER — Telehealth: Payer: Self-pay | Admitting: Orthopedic Surgery

## 2021-01-31 ENCOUNTER — Ambulatory Visit: Payer: No Typology Code available for payment source | Admitting: Medical

## 2021-01-31 MED ORDER — CHLORTHALIDONE 25 MG PO TABS
ORAL_TABLET | Freq: Every day | ORAL | 5 refills | Status: DC
  Filled 2021-01-31: qty 30, 30d supply, fill #0
  Filled 2021-03-10: qty 30, 30d supply, fill #1
  Filled 2021-04-09: qty 30, 30d supply, fill #2
  Filled 2021-05-10: qty 30, 30d supply, fill #3
  Filled 2021-06-10: qty 30, 30d supply, fill #4
  Filled 2021-07-09: qty 30, 30d supply, fill #5

## 2021-01-31 MED ORDER — OLMESARTAN MEDOXOMIL 20 MG PO TABS
20.0000 mg | ORAL_TABLET | Freq: Every day | ORAL | 3 refills | Status: DC
  Filled 2021-01-31: qty 30, 30d supply, fill #0
  Filled 2021-03-10: qty 30, 30d supply, fill #1
  Filled 2021-04-09: qty 30, 30d supply, fill #2
  Filled 2021-05-10: qty 30, 30d supply, fill #3

## 2021-01-31 NOTE — Telephone Encounter (Signed)
I tried calling patient no answer. LM for her asking for her to please reach out to our office.

## 2021-01-31 NOTE — Telephone Encounter (Signed)
FYI: Received request for clinical from [facility]. Patient has not been seen since TKA 12/30/20

## 2021-01-31 NOTE — Telephone Encounter (Signed)
Okj thx

## 2021-02-03 ENCOUNTER — Other Ambulatory Visit: Payer: Self-pay

## 2021-02-03 ENCOUNTER — Ambulatory Visit: Payer: No Typology Code available for payment source | Attending: Orthopedic Surgery | Admitting: Physical Therapy

## 2021-02-03 ENCOUNTER — Encounter: Payer: Self-pay | Admitting: Physical Therapy

## 2021-02-03 DIAGNOSIS — M6281 Muscle weakness (generalized): Secondary | ICD-10-CM | POA: Diagnosis present

## 2021-02-03 DIAGNOSIS — R600 Localized edema: Secondary | ICD-10-CM

## 2021-02-03 DIAGNOSIS — M25662 Stiffness of left knee, not elsewhere classified: Secondary | ICD-10-CM

## 2021-02-03 DIAGNOSIS — M25562 Pain in left knee: Secondary | ICD-10-CM

## 2021-02-03 DIAGNOSIS — M62838 Other muscle spasm: Secondary | ICD-10-CM

## 2021-02-03 NOTE — Patient Instructions (Signed)
Access Code: [email or web address] URL: [email or web address] Date: 02/03/2021 Prepared by: Harrie Foreman  Exercises Supine Bridge - 1 x daily - 7 x weekly - 3 sets - 10 reps Supine Active Straight Leg Raise - 1 x daily - 7 x weekly - 3 sets - 10 reps Prone Hip Extension - 1 x daily - 7 x weekly - 3 sets - 10 reps

## 2021-02-03 NOTE — Therapy (Addendum)
PHYSICAL THERAPY DISCHARGE SUMMARY (03/01/21)  Visits from Start of Care: 1 evaluation only  Current functional level related to goals / functional outcomes: Unknown.  Patient did not return after initial evaluation.     Remaining deficits: Unknown.    Education / Equipment: HEP  Plan: Patient is being discharged due to attendance policy.  Patient no-showed to 11/14, 11/2, 11/23 and 11/28 appointments and did not return calls/messages about appointments.     Jena Gauss, PT, DPT 03/01/21    [facility] [address]  [address] [place], [place], [address] Phone: [phone]   Fax:  [phone]  Physical Therapy Evaluation  Patient Details  Name: [name] MRN: [id number] Date of Birth: 1960-04-25 Referring Provider (PT): Cammy Copa   Encounter Date: 02/03/2021   PT End of Session - 02/03/21 1425     Visit Number 1    Number of Visits 12    Date for PT Re-Evaluation 03/17/21    Authorization Type [facility] VL: 25    PT Start Time 1320    PT Stop Time 1400    PT Time Calculation (min) 40 min    Activity Tolerance Patient tolerated treatment well    Behavior During Therapy WFL for tasks assessed/performed             Past Medical History:  Diagnosis Date   ADD (attention deficit disorder)    Arthritis    Depression    GERD (gastroesophageal reflux disease)    Hypertension    Psoriasis    Restless leg syndrome     Past Surgical History:  Procedure Laterality Date   CESAREAN SECTION     DILATION AND CURETTAGE OF UTERUS  1998   TONSILLECTOMY     TOTAL KNEE ARTHROPLASTY Left 12/30/2020   Procedure: LEFT TOTAL KNEE ARTHROPLASTY;  Surgeon: Cammy Copa, MD;  Location: [facility] OR;  Service: Orthopedics;  Laterality: Left;    There were no vitals filed for this visit.    Subjective Assessment - 02/03/21 1326     Subjective Patient reports still having pain and numbness after L  TKA but it is starting to improve slightly.  She has had a fall and pulled her quad.  Has been using CPM but was unable to do HHPT due to COVID infection, also had to travel to take care of her mother in law with dementia.    Pertinent History L TKA on 12/30/2020, history of L knee OA,    How long can you sit comfortably? no limitations    How long can you stand comfortably? can stand long enough to wash dishes    How long can you walk comfortably? 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    Currently in Pain? Yes    Pain Score 1    worst 4-5/10 with bending knee   Pain Location Knee    Pain Orientation Left    Pain Descriptors / Indicators Aching    Pain Type Surgical pain    Pain Onset 1 to 4 weeks ago   12/30/2020   Pain Frequency Constant    Aggravating Factors  bending knee    Pain Relieving Factors pain medication prn, elevating it    Effect of Pain on Daily Activities not able to return to work  yet                [facility] PT Assessment - 02/03/21 0001       Assessment   Medical Diagnosis M25.562,G89.29 (ICD-10-CM) - Chronic pain of left knee    Referring Provider (PT) Cammy Copa    Onset Date/Surgical Date 12/30/20    Prior Therapy 1 visit HHPT      Precautions   Precautions None      Restrictions   Weight Bearing Restrictions No      Balance Screen   Has the patient fallen in the past 6 months Yes    How many times? 1    Has the patient had a decrease in activity level because of a fear of falling?  No    Is the patient reluctant to leave their home because of a fear of falling?  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    Focus on Therapeutic Outcomes (FOTO)  55   predicts 75 at discharge     Observation/Other Assessments-Edema    Edema --   over L knee and L thigh     Sensation   Light Touch Appears Intact   numbness over incision     Posture/Postural Control   Posture/Postural Control No significant limitations      ROM / Strength   AROM / PROM / Strength PROM;Strength      PROM   Overall PROM  Within functional limits for tasks performed    PROM Assessment Site Knee    Right/Left Knee Right;Left    Right Knee Extension 0    Right Knee Flexion 143    Left Knee Extension 6    Left Knee Flexion 120      Strength   Overall Strength Within functional limits for tasks performed    Overall Strength Comments tested in sitting    Strength Assessment Site Hip;Knee    Right/Left Hip Right;Left    Right Hip Flexion 5/5    Right Hip Extension 4/5    Right Hip ABduction 5/5    Right Hip ADduction 5/5    Left Hip Flexion 4/5    Left Hip Extension 4/5    Left Hip ABduction 5/5    Left Hip ADduction 5/5    Right/Left Knee Right;Left    Right Knee Flexion 4+/5    Right Knee Extension 5/5    Left Knee Flexion 4+/5    Left Knee Extension 3+/5   quad lag of 9 deg     Palpation   Patella mobility hypermobility M/L secondary to edema, some tightness with inferior glide    Palpation comment increased tightness superior portion of L incision, noted contusion L quad distal                        Objective measurements completed on examination: See above findings.       [facility] Adult  PT Treatment/Exercise - 02/03/21 0001       Self-Care   Self-Care Scar Mobilizations;Other Self-Care Comments    Scar Mobilizations to incision    Other  Self-Care Comments  see patient instructions.      Exercises   Exercises Knee/Hip      Knee/Hip Exercises: Supine   Bridges Strengthening;10 reps    Straight Leg Raises Left;10 reps    Straight Leg Raises Limitations cues for quad set      Knee/Hip Exercises: Prone   Straight Leg Raises Strengthening;Both;10 reps                     PT Education - 02/03/21 1425     Education Details educated on findings, POC, and initial HEP.  Access Code: [email or web address] - texted to patient as well.  also educated on scar tissue massage.    Person(s) Educated Patient    Methods Explanation;Demonstration;Verbal cues;Handout    Comprehension Verbalized understanding;Returned demonstration              PT Short Term Goals - 02/03/21 1651       PT SHORT TERM GOAL #1   Title Pt. will be independent with initial HEP.    Time 2    Period Weeks    Status New    Target Date 02/17/21               PT Long Term Goals - 02/03/21 1651       PT LONG TERM GOAL #1   Title Patient will be independent with progressed HEP to improve outcomes.    Time 6    Period Weeks    Status New    Target Date 03/17/21      PT LONG TERM GOAL #2   Title Patient will demonstrate full L knee extension to improve gait mechanics and safety.    Baseline lacking 6 deg of extension, also has quad lag.    Time 6    Period Weeks    Status New    Target Date 03/17/21      PT LONG TERM GOAL #3   Title Patient will be able to walk and stand for >1 hour without increased L knee pain to return to work.    Time 6    Period Weeks    Status New    Target Date 03/17/21      PT LONG TERM GOAL #4   Title Pt. will score 75 on FOTO to demonstrate improved functional outcomes.    Baseline 55    Time 6    Period Weeks    Status New    Target Date 03/17/21      PT LONG TERM GOAL #5   Title Patient will be able to ascend/descend stairs with reciprocal step and good quad control without L knee pain.    Time 6     Period Weeks    Status New    Target Date 03/17/21                    Plan - 02/03/21 1426     Clinical Impression Statement [name] is a 60 year old female referred to PT following L TKA on 12/30/2020.  Due to extenuating circumstances, she was unable to participate in HHPT but did report good compliance with her HEP.  Today she demonstrates well-healed incision, good ROM in L knee for flexion - 120 deg, but still lacking 6 deg  of extension.  Overall her strength is good but she does demonstrate quad lag with LAQ.  Reviewed and progressed HEP, adding hip extension since she was weak bil in glutes. 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    Personal Factors and Comorbidities Comorbidity 3+    Comorbidities HTN, Psoriasis, OA, GERD, ADHD    Examination-Activity Limitations Bend;Carry;Stand;Stairs;Squat    Examination-Participation Restrictions Occupation;Community Activity    Stability/Clinical Decision Making Stable/Uncomplicated    Clinical Decision Making Low    Rehab Potential Excellent    PT Frequency 2x / week    PT Duration 6 weeks    PT Treatment/Interventions ADLs/Self Care Home Management;Cryotherapy;Electrical Stimulation;Iontophoresis 4mg /ml Dexamethasone;Moist Heat;Ultrasound;Gait training;Stair training;Functional mobility training;Therapeutic activities;Therapeutic exercise;Balance training;Neuromuscular re-education;Patient/family education;Manual techniques;Scar mobilization;Passive range of motion;Dry needling;Taping;Vasopneumatic Device;Joint Manipulations    PT Next Visit Plan start strengthening program, check stairs, manual therapy focusing on extension, modalities PRN    PT Home Exercise Plan Access Code: [email or web address]    Consulted and Agree with Plan of Care Patient             Patient will  benefit from skilled therapeutic intervention in order to improve the following deficits and impairments:  Decreased endurance, Decreased mobility, Decreased skin integrity, Increased muscle spasms, Impaired sensation, Difficulty walking, Decreased range of motion, Decreased scar mobility, Increased edema, Decreased activity tolerance, Decreased strength, Increased fascial restricitons, Impaired flexibility, Pain  Visit Diagnosis: Acute pain of left knee  Stiffness of left knee, not elsewhere classified  Other muscle spasm  Muscle weakness (generalized)  Localized edema     Problem List Patient Active Problem List   Diagnosis Date Noted   Arthritis of left knee    S/P total knee arthroplasty, left 12/30/2020   Psoriasis 06/03/2020   History of gastroesophageal reflux (GERD) 06/03/2020   Attention deficit hyperactivity disorder (ADHD), predominantly inattentive type 06/03/2020   History of depression 06/03/2020   Primary insomnia 06/03/2020   HTN (hypertension) 12/25/2017    Jena Gauss, PT, DPT 02/03/2021, 5:00 PM  [facility] [address]  [address] [place], [place], [address] Phone: [phone]   Fax:  [phone]  Name: [name] MRN: [id number] Date of Birth: Mar 02, 1961

## 2021-02-04 ENCOUNTER — Ambulatory Visit: Payer: No Typology Code available for payment source | Admitting: Medical

## 2021-02-07 ENCOUNTER — Ambulatory Visit: Payer: No Typology Code available for payment source

## 2021-02-07 NOTE — Progress Notes (Deleted)
 Office Visit Note  Patient: [name]             Date of Birth: 03/30/1960           MRN: [id number]             PCP: Saguier, Edward, PA-C Referring: Saguier, Edward, PA-C Visit Date: 02/09/2021 Occupation: @GUAROCC@  Subjective:  No chief complaint on file.   History of Present Illness: [name] is a 60 y.o. female returns today after her initial visit on June 03, 2020.***   Activities of Daily Living:  Patient reports morning stiffness for *** {minute/hour:19697}.   Patient {ACTIONS;DENIES/REPORTS:21021675::"Denies"} nocturnal pain.  Difficulty dressing/grooming: {ACTIONS;DENIES/REPORTS:21021675::"Denies"} Difficulty climbing stairs: {ACTIONS;DENIES/REPORTS:21021675::"Denies"} Difficulty getting out of chair: {ACTIONS;DENIES/REPORTS:21021675::"Denies"} Difficulty using hands for taps, buttons, cutlery, and/or writing: {ACTIONS;DENIES/REPORTS:21021675::"Denies"}  No Rheumatology ROS completed.   PMFS History:  Patient Active Problem List   Diagnosis Date Noted   Arthritis of left knee    S/P total knee arthroplasty, left 12/30/2020   Psoriasis 06/03/2020   History of gastroesophageal reflux (GERD) 06/03/2020   Attention deficit hyperactivity disorder (ADHD), predominantly inattentive type 06/03/2020   History of depression 06/03/2020   Primary insomnia 06/03/2020   HTN (hypertension) 12/25/2017    Past Medical History:  Diagnosis Date   ADD (attention deficit disorder)    Arthritis    Depression    GERD (gastroesophageal reflux disease)    Hypertension    Psoriasis    Restless leg syndrome     Family History  Problem Relation Age of Onset   Cancer Mother    Mental illness Father    Alcoholism Father    Mental illness Sister    Suicidality Sister    Healthy Son    Healthy Son    Healthy Daughter    Past Surgical History:  Procedure Laterality Date   CESAREAN SECTION     DILATION AND CURETTAGE OF UTERUS  1998   TONSILLECTOMY     TOTAL  KNEE ARTHROPLASTY Left 12/30/2020   Procedure: LEFT TOTAL KNEE ARTHROPLASTY;  Surgeon: Dean, Gregory Scott, MD;  Location: [facility] OR;  Service: Orthopedics;  Laterality: Left;   Social History   Social History Narrative   Not on file   Immunization History  Administered Date(s) Administered   Janssen (J&J) SARS-COV-2 Vaccination 11/22/2019   Zoster Recombinat (Shingrix) 09/29/2020     Objective: Vital Signs: LMP 12/25/2012 (Approximate)    Physical Exam   Musculoskeletal Exam: ***  CDAI Exam: CDAI Score: -- Patient Global: --; Provider Global: -- Swollen: --; Tender: -- Joint Exam 02/09/2021   No joint exam has been documented for this visit   There is currently no information documented on the homunculus. Go to the Rheumatology activity and complete the homunculus joint exam.  Investigation: No additional findings.  Imaging: No results found.  Recent Labs: Lab Results  Component Value Date   WBC 9.8 12/28/2020   HGB 15.4 (H) 12/28/2020   PLT 356 12/28/2020   NA 139 12/28/2020   K 3.3 (L) 12/28/2020   CL 100 12/28/2020   CO2 30 12/28/2020   GLUCOSE 118 (H) 12/28/2020   BUN 16 12/28/2020   CREATININE 0.63 12/28/2020   BILITOT 0.5 03/09/2020   ALKPHOS 73 03/09/2020   AST 14 03/09/2020   ALT 18 03/09/2020   PROT 6.7 03/09/2020   ALBUMIN 4.2 03/09/2020   CALCIUM 9.1 12/28/2020    05/11/19: ANA negative, CRP 1.2, ESR 22, RF<14, HLA-B27 negative  Speciality Comments: No   specialty comments available.  Procedures:  No procedures performed Allergies: Patient has no known allergies.   Assessment / Plan:     Visit Diagnoses: No diagnosis found.  Orders: No orders of the defined types were placed in this encounter.  No orders of the defined types were placed in this encounter.   Face-to-face time spent with patient was *** minutes. Greater than 50% of time was spent in counseling and coordination of care.  Follow-Up Instructions: No follow-ups on  file.    , MD  Note - This record has been created using Dragon software.  Chart creation errors have been sought, but may not always  have been located. Such creation errors do not reflect on  the standard of medical care.  

## 2021-02-09 ENCOUNTER — Other Ambulatory Visit: Payer: Self-pay | Admitting: Medical

## 2021-02-09 ENCOUNTER — Ambulatory Visit (INDEPENDENT_AMBULATORY_CARE_PROVIDER_SITE_OTHER): Payer: No Typology Code available for payment source

## 2021-02-09 ENCOUNTER — Other Ambulatory Visit (HOSPITAL_BASED_OUTPATIENT_CLINIC_OR_DEPARTMENT_OTHER): Payer: Self-pay

## 2021-02-09 ENCOUNTER — Other Ambulatory Visit: Payer: Self-pay

## 2021-02-09 ENCOUNTER — Ambulatory Visit: Payer: No Typology Code available for payment source | Admitting: Rheumatology

## 2021-02-09 ENCOUNTER — Ambulatory Visit (INDEPENDENT_AMBULATORY_CARE_PROVIDER_SITE_OTHER): Payer: No Typology Code available for payment source | Admitting: Surgical

## 2021-02-09 DIAGNOSIS — M19041 Primary osteoarthritis, right hand: Secondary | ICD-10-CM

## 2021-02-09 DIAGNOSIS — Z8719 Personal history of other diseases of the digestive system: Secondary | ICD-10-CM

## 2021-02-09 DIAGNOSIS — M19071 Primary osteoarthritis, right ankle and foot: Secondary | ICD-10-CM

## 2021-02-09 DIAGNOSIS — M2041 Other hammer toe(s) (acquired), right foot: Secondary | ICD-10-CM

## 2021-02-09 DIAGNOSIS — F5101 Primary insomnia: Secondary | ICD-10-CM

## 2021-02-09 DIAGNOSIS — M216X1 Other acquired deformities of right foot: Secondary | ICD-10-CM

## 2021-02-09 DIAGNOSIS — Z8659 Personal history of other mental and behavioral disorders: Secondary | ICD-10-CM

## 2021-02-09 DIAGNOSIS — F900 Attention-deficit hyperactivity disorder, predominantly inattentive type: Secondary | ICD-10-CM

## 2021-02-09 DIAGNOSIS — I10 Essential (primary) hypertension: Secondary | ICD-10-CM

## 2021-02-09 DIAGNOSIS — Z96652 Presence of left artificial knee joint: Secondary | ICD-10-CM | POA: Diagnosis not present

## 2021-02-09 DIAGNOSIS — L409 Psoriasis, unspecified: Secondary | ICD-10-CM

## 2021-02-09 DIAGNOSIS — M170 Bilateral primary osteoarthritis of knee: Secondary | ICD-10-CM

## 2021-02-09 MED ORDER — GABAPENTIN 400 MG PO CAPS
400.0000 mg | ORAL_CAPSULE | Freq: Every day | ORAL | 0 refills | Status: DC
  Filled 2021-02-09: qty 30, 30d supply, fill #0

## 2021-02-10 ENCOUNTER — Encounter: Payer: Self-pay | Admitting: Orthopedic Surgery

## 2021-02-10 ENCOUNTER — Ambulatory Visit: Payer: No Typology Code available for payment source

## 2021-02-10 NOTE — Progress Notes (Signed)
Post-Op Visit Note   Patient: [name]           Date of Birth: [birth year]           MRN: [id number] Visit Date: 02/09/2021 PCP: Esperanza Richters, PA-C   Assessment & Plan:  Chief Complaint:  Chief Complaint  Patient presents with   Left Knee - Routine Post Op     12/30/20 (5w 6d)  Left Total Knee Arthroplasty - Left     Visit Diagnoses:  1. Status post total left knee replacement     Plan: Patient is a 60 year old female who presents s/p left total knee arthroplasty on 12/30/2020.  This is her first postop visit since surgery as she has canceled several appointments, some that were due to COVID exposure.  She has been going to physical therapy at [facility] and states that this is going well.  She initially had difficulty with pain control after discharge from the hospital but she states that her pain is much improved in the last 2 weeks and she is very satisfied with her result at this point and happy that she went forward with surgery.  She denies any chest pain, shortness of breath, calf pain.  No fevers or chills or night sweats.  No change in the appearance of the incision over the last 6 weeks concerning for infection.  She is just taking Advil and 400 mg gabapentin for pain control.  On examination patient has incision that is healing well without any evidence of infection or dehiscence.  She has 0 degrees extension and 105 degrees of knee flexion.  No calf tenderness.  Negative Homans' sign.  Excellent quadricep strength rated 5/5.  Able to perform multiple straight leg raises without difficulty.  She is ambulating in the office today without the assistance of a cane or walker.  Left knee radiographs taken today demonstrate left knee prosthesis in excellent position and alignment without any complicating features.  Plan for her to continue with physical therapy.  She has excellent extension and her flexion is good for 6 weeks out.  Focus on flexion and getting as  much quad strength back as possible and follow-up in 6 weeks for final check with Dr. August Saucer.  Patient agreed with plan.  Follow-Up Instructions: No follow-ups on file.   Orders:  Orders Placed This Encounter  Procedures   XR Knee 1-2 Views Left   No orders of the defined types were placed in this encounter.   Imaging: No results found.  PMFS History: Patient Active Problem List   Diagnosis Date Noted   Arthritis of left knee    S/P total knee arthroplasty, left 12/30/2020   Psoriasis 06/03/2020   History of gastroesophageal reflux (GERD) 06/03/2020   Attention deficit hyperactivity disorder (ADHD), predominantly inattentive type 06/03/2020   History of depression 06/03/2020   Primary insomnia 06/03/2020   HTN (hypertension) 12/25/2017   Past Medical History:  Diagnosis Date   ADD (attention deficit disorder)    Arthritis    Depression    GERD (gastroesophageal reflux disease)    Hypertension    Psoriasis    Restless leg syndrome     Family History  Problem Relation Age of Onset   Cancer Mother    Mental illness Father    Alcoholism Father    Mental illness Sister    Suicidality Sister    Healthy Son    Healthy Son    Healthy Daughter  Past Surgical History:  Procedure Laterality Date   CESAREAN SECTION     DILATION AND CURETTAGE OF UTERUS  1998   TONSILLECTOMY     TOTAL KNEE ARTHROPLASTY Left 12/30/2020   Procedure: LEFT TOTAL KNEE ARTHROPLASTY;  Surgeon: Cammy Copa, MD;  Location: [facility] OR;  Service: Orthopedics;  Laterality: Left;   Social History   Occupational History   Not on file  Tobacco Use   Smoking status: Never   Smokeless tobacco: Never  Vaping Use   Vaping Use: Never used  Substance and Sexual Activity   Alcohol use: Yes    Alcohol/week: 3.0 standard drinks    Types: 3 Glasses of wine per week    Comment: social   Drug use: No   Sexual activity: Not on file

## 2021-02-14 ENCOUNTER — Ambulatory Visit: Payer: No Typology Code available for payment source

## 2021-02-16 ENCOUNTER — Encounter: Payer: Self-pay | Admitting: Physical Therapy

## 2021-02-16 ENCOUNTER — Ambulatory Visit: Payer: No Typology Code available for payment source | Admitting: Physical Therapy

## 2021-02-21 ENCOUNTER — Ambulatory Visit: Payer: No Typology Code available for payment source

## 2021-03-03 ENCOUNTER — Encounter: Payer: No Typology Code available for payment source | Admitting: Physical Therapy

## 2021-03-07 ENCOUNTER — Encounter: Payer: No Typology Code available for payment source | Admitting: Physical Therapy

## 2021-03-10 ENCOUNTER — Other Ambulatory Visit: Payer: Self-pay | Admitting: Medical

## 2021-03-10 ENCOUNTER — Other Ambulatory Visit (HOSPITAL_BASED_OUTPATIENT_CLINIC_OR_DEPARTMENT_OTHER): Payer: Self-pay

## 2021-03-10 MED ORDER — OMEPRAZOLE 20 MG PO CPDR
DELAYED_RELEASE_CAPSULE | Freq: Every day | ORAL | 6 refills | Status: DC
  Filled 2021-03-10: qty 30, 30d supply, fill #0
  Filled 2021-04-09: qty 30, 30d supply, fill #1
  Filled 2021-05-10: qty 30, 30d supply, fill #2
  Filled 2021-06-10: qty 30, 30d supply, fill #3
  Filled 2021-07-09: qty 30, 30d supply, fill #4
  Filled 2021-08-09: qty 30, 30d supply, fill #5
  Filled 2021-09-06: qty 30, 30d supply, fill #6

## 2021-03-11 ENCOUNTER — Other Ambulatory Visit (HOSPITAL_BASED_OUTPATIENT_CLINIC_OR_DEPARTMENT_OTHER): Payer: Self-pay

## 2021-03-11 ENCOUNTER — Ambulatory Visit (INDEPENDENT_AMBULATORY_CARE_PROVIDER_SITE_OTHER): Payer: No Typology Code available for payment source | Admitting: Medical

## 2021-03-11 VITALS — BP 153/90 | HR 100 | Temp 97.9°F | Resp 18 | Ht 63.5 in | Wt 224.0 lb

## 2021-03-11 DIAGNOSIS — Z23 Encounter for immunization: Secondary | ICD-10-CM

## 2021-03-11 DIAGNOSIS — G2581 Restless legs syndrome: Secondary | ICD-10-CM | POA: Diagnosis not present

## 2021-03-11 DIAGNOSIS — F900 Attention-deficit hyperactivity disorder, predominantly inattentive type: Secondary | ICD-10-CM | POA: Diagnosis not present

## 2021-03-11 DIAGNOSIS — I10 Essential (primary) hypertension: Secondary | ICD-10-CM | POA: Diagnosis not present

## 2021-03-11 DIAGNOSIS — E669 Obesity, unspecified: Secondary | ICD-10-CM

## 2021-03-11 DIAGNOSIS — Z8719 Personal history of other diseases of the digestive system: Secondary | ICD-10-CM

## 2021-03-11 MED ORDER — GABAPENTIN 600 MG PO TABS
600.0000 mg | ORAL_TABLET | Freq: Every day | ORAL | 2 refills | Status: DC
  Filled 2021-03-11: qty 30, 30d supply, fill #0
  Filled 2021-04-09: qty 30, 30d supply, fill #1
  Filled 2021-05-10: qty 30, 30d supply, fill #2

## 2021-03-11 MED ORDER — LISDEXAMFETAMINE DIMESYLATE 50 MG PO CAPS
50.0000 mg | ORAL_CAPSULE | Freq: Every day | ORAL | 0 refills | Status: DC
  Filled 2021-03-11: qty 30, 30d supply, fill #0

## 2021-03-11 MED ORDER — DESVENLAFAXINE SUCCINATE ER 50 MG PO TB24
ORAL_TABLET | Freq: Every day | ORAL | 3 refills | Status: DC
  Filled 2021-03-19 – 2021-04-09 (×2): qty 30, 30d supply, fill #0
  Filled 2021-05-10: qty 30, 30d supply, fill #1
  Filled 2021-06-10: qty 30, 30d supply, fill #2
  Filled 2021-07-09: qty 30, 30d supply, fill #3

## 2021-03-11 NOTE — Addendum Note (Signed)
Addended by: Gwenevere Abbot on: 03/11/2021 04:04 PM   Modules accepted: Orders

## 2021-03-11 NOTE — Patient Instructions (Addendum)
ADD-well-controlled on medication Vyvanse.  Up-to-date on contract and UDS.  Refilled medication today.  Htn-blood pressure very well controlled today.  Rechecked manually.  Continue current BP regimen.  Insomnia-continue trazodone.  Depression-mood well controlled.  Continue Pristiq.  Psoriasis-psoriasis follow-up with dermatologist.  Obese-recommend diet and exercise as tolerated by your knee.  Consider med such as Ozempic, Saxenda or other similar med.  We will wait until January when new insurance started.  For restless leg increase your gabapentin to 600 mg at night.  Follow-up in 3 to 4 months or sooner if needed.

## 2021-03-11 NOTE — Progress Notes (Signed)
Subjective:    Patient ID: [name], female    DOB: December 25, 1960, 60 y.o.   MRN: [id number]  HPI  Pt in for follow up/controlled med visit for vyvanse/add.  Hx of ADD. She is on vyvanse 50 mg daily. Up to date on contract and UDS. Pt just now running out of her last tabs.  Htn- bp high initially.  For insomnia and restless leg rx gabapentin. Pt is on 400 mg at bedtime. Pt wants to try higher dose. Willing to try 600 mg at night.   Depression- pt mood stable. She is on prestique.  Pt was on otezla for psoriasis. Pt needs to follow up with dermatologist.   Obese pt and she want to lose weight. No hx of pancreatitis. No family hx of thyromedullary cancer.   Review of Systems  Constitutional:  Negative for chills, fatigue and fever.  HENT:  Negative for congestion, drooling and ear pain.   Respiratory:  Negative for cough, chest tightness, shortness of breath, wheezing and stridor.   Cardiovascular:  Negative for chest pain and palpitations.  Gastrointestinal:  Negative for abdominal pain, constipation, diarrhea and nausea.  Genitourinary:  Negative for dysuria.  Musculoskeletal:  Negative for back pain and joint swelling.  Skin:  Negative for rash.  Neurological:  Negative for seizures, syncope, facial asymmetry, weakness and numbness.       Restless leg.  Hematological:  Negative for adenopathy. Does not bruise/bleed easily.  Psychiatric/Behavioral:  Negative for behavioral problems, dysphoric mood, hallucinations, sleep disturbance and suicidal ideas. The patient is not nervous/anxious.    Past Medical History:  Diagnosis Date   ADD (attention deficit disorder)    Arthritis    Depression    GERD (gastroesophageal reflux disease)    Hypertension    Psoriasis    Restless leg syndrome      Social History   Socioeconomic History   Marital status: Divorced    Spouse name: Not on file   Number of children: Not on file   Years of education: Not on file   Highest  education level: Not on file  Occupational History   Not on file  Tobacco Use   Smoking status: Never   Smokeless tobacco: Never  Vaping Use   Vaping Use: Never used  Substance and Sexual Activity   Alcohol use: Yes    Alcohol/week: 3.0 standard drinks    Types: 3 Glasses of wine per week    Comment: social   Drug use: No   Sexual activity: Not on file  Other Topics Concern   Not on file  Social History Narrative   Not on file   Social Determinants of Health   Financial Resource Strain: Not on file  Food Insecurity: Not on file  Transportation Needs: Not on file  Physical Activity: Not on file  Stress: Not on file  Social Connections: Not on file  Intimate Partner Violence: Not on file    Past Surgical History:  Procedure Laterality Date   CESAREAN SECTION     DILATION AND CURETTAGE OF UTERUS  1998   TONSILLECTOMY     TOTAL KNEE ARTHROPLASTY Left 12/30/2020   Procedure: LEFT TOTAL KNEE ARTHROPLASTY;  Surgeon: Cammy Copa, MD;  Location: [facility];  Service: Orthopedics;  Laterality: Left;    Family History  Problem Relation Age of Onset   Cancer Mother    Mental illness Father    Alcoholism Father    Mental illness Sister  Suicidality Sister    Healthy Son    Healthy Son    Healthy Daughter     No Known Allergies  Current Outpatient Medications on File Prior to Visit  Medication Sig Dispense Refill   chlorthalidone (HYGROTON) 25 MG tablet TAKE 1 TABLET BY MOUTH ONCE DAILY 30 tablet 5   clobetasol (TEMOVATE) 0.05 % external solution APPLY TO THE AFFECTED AREAS OF THE SCALP 2 TIMES DAILY AS NEEDED 50 mL 5   desvenlafaxine (PRISTIQ) 50 MG 24 hr tablet TAKE 1 TABLET (50 MG TOTAL) BY MOUTH DAILY. 90 tablet 1   gabapentin (NEURONTIN) 400 MG capsule Take 1 capsule (400 mg total) by mouth at bedtime. 30 capsule 0   lisdexamfetamine (VYVANSE) 50 MG capsule Take 1 capsule (50 mg total) by mouth daily. 30 capsule 0   olmesartan (BENICAR) 20 MG tablet Take 1  tablet (20 mg total) by mouth daily. 30 tablet 3   omeprazole (PRILOSEC) 20 MG capsule TAKE 1 CAPSULE (20 MG TOTAL) BY MOUTH DAILY. 30 capsule 6   potassium chloride (KLOR-CON) 10 MEQ tablet TKAE 1 TABLET BY MOUTH ON MONDAY, WEDNESDAY AND FRIDAY 36 tablet 3   acetaminophen (TYLENOL) 325 MG tablet Take 1-2 tablets (325-650 mg total) by mouth every 6 (six) hours as needed for mild pain (pain score 1-3 or temp > 100.5). (Patient not taking: Reported on 02/03/2021) 100 tablet 0   Apremilast (OTEZLA) 30 MG TABS Take 1 tablet by mouth twice a day with meals (Patient not taking: Reported on 02/03/2021) 60 tablet 3   aspirin 81 MG chewable tablet Chew 1 tablet (81 mg total) by mouth 2 (two) times daily. (Patient not taking: Reported on 02/03/2021) 44 tablet 0   docusate sodium (COLACE) 100 MG capsule Take 1 capsule (100 mg total) by mouth 2 (two) times daily. (Patient not taking: Reported on 02/03/2021) 100 capsule 0   ibuprofen (ADVIL) 200 MG tablet Take 800 mg by mouth every 6 (six) hours as needed for moderate pain or headache.     methocarbamol (ROBAXIN) 500 MG tablet Take 1 tablet (500 mg total) by mouth every 6 (six) hours as needed for muscle spasms. (Patient not taking: Reported on 02/03/2021) 32 tablet 0   No current facility-administered medications on file prior to visit.    BP (!) 153/90    Pulse 100    Temp 97.9 F (36.6 C)    Resp 18    Ht 5' 3.5" (1.613 m)    Wt 224 lb (101.6 kg)    LMP 12/25/2012 (Approximate)    SpO2 100%    BMI 39.06 kg/m        Objective:   Physical Exam  General Mental Status- Alert. General Appearance- Not in acute distress.   Skin General: Color- Normal Color. Moisture- Normal Moisture.  Neck Carotid Arteries- Normal color. Moisture- Normal Moisture. No carotid bruits. No JVD.  Chest and Lung Exam Auscultation: Breath Sounds:-Normal.  Cardiovascular Auscultation:Rythm- Regular. Murmurs & Other Heart Sounds:Auscultation of the heart reveals- No  Murmurs.  Abdomen Inspection:-Inspeection Normal. Palpation/Percussion:Note:No mass. Palpation and Percussion of the abdomen reveal- Non Tender, Non Distended + BS, no rebound or guarding.  Neurologic Cranial Nerve exam:- CN III-XII intact(No nystagmus), symmetric smile. Strength:- 5/5 equal and symmetric strength both upper and lower extremities.       Assessment & Plan:   Patient Instructions  ADD-well-controlled on medication Vyvanse.  Up-to-date on contract and UDS.  Refilled medication today.  Htn-blood pressure very well controlled today.  Rechecked manually.  Continue current BP regimen.  Insomnia-continue trazodone.  Depression-mood well controlled.  Continue Pristiq.  Psoriasis-psoriasis follow-up with dermatologist.  Obese-recommend diet and exercise as tolerated by your knee.  Consider med such as Ozempic, Saxenda or other similar med.  We will wait until January when new insurance started.  For restless leg increase your gabapentin to 600 mg at night.  Follow-up in 3 to 4 months or sooner if needed.   Esperanza Richters, PA-C

## 2021-03-14 ENCOUNTER — Ambulatory Visit: Payer: No Typology Code available for payment source | Admitting: Medical

## 2021-03-19 MED FILL — Clobetasol Propionate Soln 0.05%: CUTANEOUS | 30 days supply | Qty: 50 | Fill #3 | Status: CN

## 2021-03-22 ENCOUNTER — Other Ambulatory Visit (HOSPITAL_BASED_OUTPATIENT_CLINIC_OR_DEPARTMENT_OTHER): Payer: Self-pay

## 2021-03-25 ENCOUNTER — Other Ambulatory Visit (HOSPITAL_BASED_OUTPATIENT_CLINIC_OR_DEPARTMENT_OTHER): Payer: Self-pay

## 2021-03-29 ENCOUNTER — Other Ambulatory Visit (HOSPITAL_COMMUNITY): Payer: Self-pay

## 2021-03-29 ENCOUNTER — Encounter: Payer: Self-pay | Admitting: Medical

## 2021-04-01 ENCOUNTER — Ambulatory Visit (INDEPENDENT_AMBULATORY_CARE_PROVIDER_SITE_OTHER): Payer: 59 | Admitting: Orthopedic Surgery

## 2021-04-01 ENCOUNTER — Other Ambulatory Visit: Payer: Self-pay

## 2021-04-01 ENCOUNTER — Other Ambulatory Visit (HOSPITAL_BASED_OUTPATIENT_CLINIC_OR_DEPARTMENT_OTHER): Payer: Self-pay

## 2021-04-01 ENCOUNTER — Encounter: Payer: Self-pay | Admitting: Medical

## 2021-04-01 DIAGNOSIS — Z96652 Presence of left artificial knee joint: Secondary | ICD-10-CM

## 2021-04-05 ENCOUNTER — Encounter: Payer: Self-pay | Admitting: Orthopedic Surgery

## 2021-04-05 NOTE — Progress Notes (Signed)
° °  Post-Op Visit Note   Patient: [name]           Date of Birth: 1960/07/02           MRN: [id number] Visit Date: 04/01/2021 PCP: Esperanza Richters, PA-C   Assessment & Plan:  Chief Complaint:  Chief Complaint  Patient presents with   Left Knee - Routine Post Op   Visit Diagnoses:  1. Status post total left knee replacement     Plan: [name] is a patient who is now about 4 months out left total knee replacement.  She works at [employer].  Would like to go back to work.  Not having any problems.  She has finished physical therapy.  On exam she has excellent range of motion and good quad strength.  Gait is normal.  I think she is okay to get back to work.  Continue with nonweightbearing quad strengthening exercises and follow-up as needed..  Overall she is very happy with her knee replacement result.  Follow-Up Instructions: No follow-ups on file.   Orders:  No orders of the defined types were placed in this encounter.  No orders of the defined types were placed in this encounter.   Imaging: No results found.  PMFS History: Patient Active Problem List   Diagnosis Date Noted   Arthritis of left knee    S/P total knee arthroplasty, left 12/30/2020   Psoriasis 06/03/2020   History of gastroesophageal reflux (GERD) 06/03/2020   Attention deficit hyperactivity disorder (ADHD), predominantly inattentive type 06/03/2020   History of depression 06/03/2020   Primary insomnia 06/03/2020   HTN (hypertension) 12/25/2017   Past Medical History:  Diagnosis Date   ADD (attention deficit disorder)    Arthritis    Depression    GERD (gastroesophageal reflux disease)    Hypertension    Psoriasis    Restless leg syndrome     Family History  Problem Relation Age of Onset   Cancer Mother    Mental illness Father    Alcoholism Father    Mental illness Sister    Suicidality Sister    Healthy Son    Healthy Son    Healthy Daughter     Past Surgical  History:  Procedure Laterality Date   CESAREAN SECTION     DILATION AND CURETTAGE OF UTERUS  1998   TONSILLECTOMY     TOTAL KNEE ARTHROPLASTY Left 12/30/2020   Procedure: LEFT TOTAL KNEE ARTHROPLASTY;  Surgeon: Cammy Copa, MD;  Location: [facility];  Service: Orthopedics;  Laterality: Left;   Social History   Occupational History   Not on file  Tobacco Use   Smoking status: Never   Smokeless tobacco: Never  Vaping Use   Vaping Use: Never used  Substance and Sexual Activity   Alcohol use: Yes    Alcohol/week: 3.0 standard drinks    Types: 3 Glasses of wine per week    Comment: social   Drug use: No   Sexual activity: Not on file

## 2021-04-08 ENCOUNTER — Ambulatory Visit: Payer: No Typology Code available for payment source | Admitting: Medical

## 2021-04-09 ENCOUNTER — Other Ambulatory Visit: Payer: Self-pay | Admitting: Medical

## 2021-04-09 MED FILL — Clobetasol Propionate Soln 0.05%: CUTANEOUS | 30 days supply | Qty: 50 | Fill #3 | Status: AC

## 2021-04-11 ENCOUNTER — Other Ambulatory Visit (HOSPITAL_BASED_OUTPATIENT_CLINIC_OR_DEPARTMENT_OTHER): Payer: Self-pay

## 2021-04-11 NOTE — Telephone Encounter (Signed)
Requesting: Vyvanse 50mg   Contract: 09/29/2020 UDS: 09/29/2020 Last Visit: 03/11/2021 Next Visit: None Last Refill: 03/11/2021 #30 and 0RF  Please Advise

## 2021-04-12 ENCOUNTER — Other Ambulatory Visit: Payer: Self-pay | Admitting: Medical

## 2021-04-12 ENCOUNTER — Other Ambulatory Visit (HOSPITAL_BASED_OUTPATIENT_CLINIC_OR_DEPARTMENT_OTHER): Payer: Self-pay

## 2021-04-12 ENCOUNTER — Encounter: Payer: Self-pay | Admitting: Medical

## 2021-04-12 NOTE — Telephone Encounter (Signed)
Requesting: vyvanse Contract:09/29/20 UDS:09/29/20 Last Visit:03/11/21 Next Visit: n/a Last Refill:03/11/21  Please Advise

## 2021-04-13 ENCOUNTER — Other Ambulatory Visit (HOSPITAL_BASED_OUTPATIENT_CLINIC_OR_DEPARTMENT_OTHER): Payer: Self-pay

## 2021-04-13 MED ORDER — LISDEXAMFETAMINE DIMESYLATE 50 MG PO CAPS
50.0000 mg | ORAL_CAPSULE | Freq: Every day | ORAL | 0 refills | Status: DC
  Filled 2021-04-13: qty 30, 30d supply, fill #0

## 2021-05-02 NOTE — Progress Notes (Unsigned)
Office Visit Note  Patient: [name]             Date of Birth: [birth year]           MRN: [id number]             PCP: Elise Benne Referring: Elise Benne Visit Date: 05/16/2021 Occupation: @GUAROCC @  Subjective:    History of Present Illness: [name] is a 61 y.o. female with history of polyarthralgia and osteoarthritis.   Activities of Daily Living:  Patient reports morning stiffness for *** {minute/hour:19697}.   Patient {ACTIONS;DENIES/REPORTS:21021675::"Denies"} nocturnal pain.  Difficulty dressing/grooming: {ACTIONS;DENIES/REPORTS:21021675::"Denies"} Difficulty climbing stairs: {ACTIONS;DENIES/REPORTS:21021675::"Denies"} Difficulty getting out of chair: {ACTIONS;DENIES/REPORTS:21021675::"Denies"} Difficulty using hands for taps, buttons, cutlery, and/or writing: {ACTIONS;DENIES/REPORTS:21021675::"Denies"}  No Rheumatology ROS completed.   PMFS History:  Patient Active Problem List   Diagnosis Date Noted   Arthritis of left knee    S/P total knee arthroplasty, left 12/30/2020   Psoriasis 06/03/2020   History of gastroesophageal reflux (GERD) 06/03/2020   Attention deficit hyperactivity disorder (ADHD), predominantly inattentive type 06/03/2020   History of depression 06/03/2020   Primary insomnia 06/03/2020   HTN (hypertension) 12/25/2017    Past Medical History:  Diagnosis Date   ADD (attention deficit disorder)    Arthritis    Depression    GERD (gastroesophageal reflux disease)    Hypertension    Psoriasis    Restless leg syndrome     Family History  Problem Relation Age of Onset   Cancer Mother    Mental illness Father    Alcoholism Father    Mental illness Sister    Suicidality Sister    Healthy Son    Healthy Son    Healthy Daughter    Past Surgical History:  Procedure Laterality Date   CESAREAN SECTION     DILATION AND CURETTAGE OF UTERUS  1998   TONSILLECTOMY     TOTAL KNEE ARTHROPLASTY Left 12/30/2020    Procedure: LEFT TOTAL KNEE ARTHROPLASTY;  Surgeon: Meredith Pel, MD;  Location: [facility];  Service: Orthopedics;  Laterality: Left;   Social History   Social History Narrative   Not on file   Immunization History  Administered Date(s) Administered   Influenza,inj,Quad PF,6+ Mos 03/11/2021   Janssen (J&J) SARS-COV-2 Vaccination 11/22/2019   Zoster Recombinat (Shingrix) 09/29/2020     Objective: Vital Signs: LMP 12/25/2012 (Approximate)    Physical Exam Vitals and nursing note reviewed.  Constitutional:      Appearance: She is well-developed.  HENT:     Head: Normocephalic and atraumatic.  Eyes:     Conjunctiva/sclera: Conjunctivae normal.  Cardiovascular:     Rate and Rhythm: Normal rate and regular rhythm.     Heart sounds: Normal heart sounds.  Pulmonary:     Effort: Pulmonary effort is normal.     Breath sounds: Normal breath sounds.  Abdominal:     General: Bowel sounds are normal.     Palpations: Abdomen is soft.  Musculoskeletal:     Cervical back: Normal range of motion.  Lymphadenopathy:     Cervical: No cervical adenopathy.  Skin:    General: Skin is warm and dry.     Capillary Refill: Capillary refill takes less than 2 seconds.  Neurological:     Mental Status: She is alert and oriented to person, place, and time.  Psychiatric:        Behavior: Behavior normal.     Musculoskeletal Exam: ***  CDAI Exam: CDAI  Score: -- Patient Global: --; Provider Global: -- Swollen: --; Tender: -- Joint Exam 05/16/2021   No joint exam has been documented for this visit   There is currently no information documented on the homunculus. Go to the Rheumatology activity and complete the homunculus joint exam.  Investigation: No additional findings.  Imaging: No results found.  Recent Labs: Lab Results  Component Value Date   WBC 9.8 12/28/2020   HGB 15.4 (H) 12/28/2020   PLT 356 12/28/2020   NA 139 12/28/2020   K 3.3 (L) 12/28/2020   CL 100 12/28/2020    CO2 30 12/28/2020   GLUCOSE 118 (H) 12/28/2020   BUN 16 12/28/2020   CREATININE 0.63 12/28/2020   BILITOT 0.5 03/09/2020   ALKPHOS 73 03/09/2020   AST 14 03/09/2020   ALT 18 03/09/2020   PROT 6.7 03/09/2020   ALBUMIN 4.2 03/09/2020   CALCIUM 9.1 12/28/2020    Speciality Comments: No specialty comments available.  Procedures:  No procedures performed Allergies: Patient has no known allergies.   Assessment / Plan:     Visit Diagnoses: Pain in both hands - 05/11/19: ANA negative, CRP 1.2, ESR 22, RF<14, HLA-B27 negative  Primary osteoarthritis of both knees - She has left knee joint severe osteoarthritis which will require total knee replacement.  Right knee joint and moderate osteoarthritis.    Pain in both feet - X-rays were consistent with osteoarthritis.  Acquired bilateral pes cavus  Hammertoes of both feet  Psoriasis - she was diagnosed with psoriasis when she was 19.   History of gastroesophageal reflux (GERD)  Primary hypertension  History of depression  Attention deficit hyperactivity disorder (ADHD), predominantly inattentive type  Primary insomnia  Orders: No orders of the defined types were placed in this encounter.  No orders of the defined types were placed in this encounter.    Follow-Up Instructions: No follow-ups on file.   Ofilia Neas, PA-C  Note - This record has been created using Dragon software.  Chart creation errors have been sought, but may not always  have been located. Such creation errors do not reflect on  the standard of medical care.

## 2021-05-10 ENCOUNTER — Other Ambulatory Visit: Payer: Self-pay | Admitting: Medical

## 2021-05-10 MED FILL — Clobetasol Propionate Soln 0.05%: CUTANEOUS | 30 days supply | Qty: 50 | Fill #4 | Status: AC

## 2021-05-11 ENCOUNTER — Other Ambulatory Visit (HOSPITAL_BASED_OUTPATIENT_CLINIC_OR_DEPARTMENT_OTHER): Payer: Self-pay

## 2021-05-11 MED ORDER — POTASSIUM CHLORIDE ER 10 MEQ PO TBCR
EXTENDED_RELEASE_TABLET | ORAL | 3 refills | Status: DC
  Filled 2021-05-11: qty 36, 84d supply, fill #0
  Filled 2021-08-09: qty 36, 84d supply, fill #1
  Filled 2021-11-03: qty 36, 84d supply, fill #2
  Filled 2022-01-27: qty 36, 84d supply, fill #3

## 2021-05-12 ENCOUNTER — Other Ambulatory Visit (HOSPITAL_BASED_OUTPATIENT_CLINIC_OR_DEPARTMENT_OTHER): Payer: Self-pay

## 2021-05-12 ENCOUNTER — Other Ambulatory Visit: Payer: Self-pay | Admitting: Medical

## 2021-05-12 MED ORDER — LISDEXAMFETAMINE DIMESYLATE 50 MG PO CAPS
50.0000 mg | ORAL_CAPSULE | Freq: Every day | ORAL | 0 refills | Status: DC
  Filled 2021-05-12: qty 30, 30d supply, fill #0

## 2021-05-12 NOTE — Telephone Encounter (Addendum)
Requesting: vyvanse Contract: 09/29/20 UDS:09/29/20 Last Visit:03/11/21 Next Visit:n/a Last Refill:04/13/21  Please Advise   Rx refill sent to pharmacy  Esperanza Richters, PA-C

## 2021-05-13 ENCOUNTER — Telehealth: Payer: Self-pay | Admitting: Podiatry

## 2021-05-13 NOTE — Telephone Encounter (Signed)
Called patient, unable to lvm for schedule an appointment for orthotic pick up

## 2021-05-16 ENCOUNTER — Ambulatory Visit: Payer: 59 | Admitting: Physician Assistant

## 2021-05-16 ENCOUNTER — Encounter: Payer: Self-pay | Admitting: *Deleted

## 2021-05-16 DIAGNOSIS — L409 Psoriasis, unspecified: Secondary | ICD-10-CM

## 2021-05-16 DIAGNOSIS — Z8659 Personal history of other mental and behavioral disorders: Secondary | ICD-10-CM

## 2021-05-16 DIAGNOSIS — Z8719 Personal history of other diseases of the digestive system: Secondary | ICD-10-CM

## 2021-05-16 DIAGNOSIS — M79671 Pain in right foot: Secondary | ICD-10-CM

## 2021-05-16 DIAGNOSIS — M216X1 Other acquired deformities of right foot: Secondary | ICD-10-CM

## 2021-05-16 DIAGNOSIS — F900 Attention-deficit hyperactivity disorder, predominantly inattentive type: Secondary | ICD-10-CM

## 2021-05-16 DIAGNOSIS — M79641 Pain in right hand: Secondary | ICD-10-CM

## 2021-05-16 DIAGNOSIS — M2041 Other hammer toe(s) (acquired), right foot: Secondary | ICD-10-CM

## 2021-05-16 DIAGNOSIS — M170 Bilateral primary osteoarthritis of knee: Secondary | ICD-10-CM

## 2021-05-16 DIAGNOSIS — F5101 Primary insomnia: Secondary | ICD-10-CM

## 2021-05-16 DIAGNOSIS — I10 Essential (primary) hypertension: Secondary | ICD-10-CM

## 2021-06-10 ENCOUNTER — Other Ambulatory Visit (HOSPITAL_BASED_OUTPATIENT_CLINIC_OR_DEPARTMENT_OTHER): Payer: Self-pay

## 2021-06-10 ENCOUNTER — Other Ambulatory Visit: Payer: Self-pay | Admitting: Medical

## 2021-06-10 MED ORDER — GABAPENTIN 600 MG PO TABS
600.0000 mg | ORAL_TABLET | Freq: Every day | ORAL | 2 refills | Status: DC
  Filled 2021-06-10: qty 30, 30d supply, fill #0
  Filled 2021-07-09: qty 30, 30d supply, fill #1
  Filled 2021-08-09: qty 30, 30d supply, fill #2

## 2021-06-10 MED ORDER — OLMESARTAN MEDOXOMIL 20 MG PO TABS
20.0000 mg | ORAL_TABLET | Freq: Every day | ORAL | 3 refills | Status: DC
  Filled 2021-06-10: qty 30, 30d supply, fill #0
  Filled 2021-07-09: qty 30, 30d supply, fill #1
  Filled 2021-08-09: qty 30, 30d supply, fill #2
  Filled 2021-09-06: qty 30, 30d supply, fill #3

## 2021-06-13 ENCOUNTER — Other Ambulatory Visit: Payer: Self-pay | Admitting: Medical

## 2021-06-13 ENCOUNTER — Other Ambulatory Visit (HOSPITAL_BASED_OUTPATIENT_CLINIC_OR_DEPARTMENT_OTHER): Payer: Self-pay

## 2021-06-13 MED ORDER — LISDEXAMFETAMINE DIMESYLATE 50 MG PO CAPS
50.0000 mg | ORAL_CAPSULE | Freq: Every day | ORAL | 0 refills | Status: DC
  Filled 2021-06-13: qty 30, 30d supply, fill #0

## 2021-06-13 NOTE — Telephone Encounter (Addendum)
Requesting: Vyvanse 50mg   ?Contract: 09/29/20 ?UDS: 09/29/2020 ?Last Visit: 03/11/2021 ?Next Visit: None ?Last Refill: 05/12/2021 #30 and 0RF ? ?Please Advise ? ? ?Refill sent to pharmacy. ? ?05/14/2021, PA-C  ?

## 2021-07-09 ENCOUNTER — Other Ambulatory Visit: Payer: Self-pay | Admitting: Medical

## 2021-07-11 ENCOUNTER — Other Ambulatory Visit (HOSPITAL_BASED_OUTPATIENT_CLINIC_OR_DEPARTMENT_OTHER): Payer: Self-pay

## 2021-07-11 ENCOUNTER — Other Ambulatory Visit (HOSPITAL_COMMUNITY): Payer: Self-pay

## 2021-07-11 NOTE — Telephone Encounter (Addendum)
Requesting: Vyvanse 50mg   ?Contract: 09/29/20 ?UDS: 09/29/20 ?Last Visit: 03/11/21 ?Next Visit: None ?Last Refill: 06/13/21 #30 and 0RF ? ?Please Advise ? ?Rx refill sent to pt pharmacy. ? ?Mackie Pai, PA-C  ? ?

## 2021-07-11 NOTE — Telephone Encounter (Signed)
Requesting: vyvanse ?/Contract:7/22 ?UDS:09/29/2020 ?Last Visit:02/2021 ?Next Visit:n/a ?Last Refill:06/13/21 ? ?Please Advise  ?

## 2021-07-12 ENCOUNTER — Other Ambulatory Visit (HOSPITAL_BASED_OUTPATIENT_CLINIC_OR_DEPARTMENT_OTHER): Payer: Self-pay

## 2021-07-12 ENCOUNTER — Other Ambulatory Visit (HOSPITAL_COMMUNITY): Payer: Self-pay

## 2021-07-12 MED ORDER — LISDEXAMFETAMINE DIMESYLATE 50 MG PO CAPS
50.0000 mg | ORAL_CAPSULE | Freq: Every day | ORAL | 0 refills | Status: DC
  Filled 2021-07-12: qty 30, 30d supply, fill #0

## 2021-07-13 ENCOUNTER — Telehealth: Payer: Self-pay | Admitting: Pharmacist

## 2021-07-13 ENCOUNTER — Other Ambulatory Visit (HOSPITAL_COMMUNITY): Payer: Self-pay

## 2021-07-13 NOTE — Telephone Encounter (Signed)
Called patient to schedule an appointment for the [facility]. I was unable to reach the patient so I left a HIPAA-compliant message requesting that the patient return my call.   Luke Van Ausdall, PharmD, BCACP, CPP Clinical Pharmacist [facility] [phone]  

## 2021-07-26 DIAGNOSIS — L400 Psoriasis vulgaris: Secondary | ICD-10-CM | POA: Diagnosis not present

## 2021-07-28 ENCOUNTER — Other Ambulatory Visit (HOSPITAL_COMMUNITY): Payer: Self-pay

## 2021-08-01 ENCOUNTER — Other Ambulatory Visit (HOSPITAL_COMMUNITY): Payer: Self-pay

## 2021-08-03 ENCOUNTER — Other Ambulatory Visit (HOSPITAL_COMMUNITY): Payer: Self-pay

## 2021-08-03 MED ORDER — SKYRIZI PEN 150 MG/ML ~~LOC~~ SOAJ
SUBCUTANEOUS | 5 refills | Status: DC
  Filled 2021-08-03 – 2021-08-10 (×2): qty 1, 28d supply, fill #0

## 2021-08-04 ENCOUNTER — Other Ambulatory Visit (HOSPITAL_COMMUNITY): Payer: Self-pay

## 2021-08-04 ENCOUNTER — Encounter: Payer: Self-pay | Admitting: Podiatry

## 2021-08-05 ENCOUNTER — Telehealth: Payer: Self-pay | Admitting: Pharmacist

## 2021-08-05 NOTE — Telephone Encounter (Signed)
Called patient to schedule an appointment for the [facility]. I was unable to reach the patient so I left a HIPAA-compliant message requesting that the patient return my call.   Luke Van Ausdall, PharmD, BCACP, CPP Clinical Pharmacist [facility] [phone]  

## 2021-08-09 ENCOUNTER — Other Ambulatory Visit: Payer: Self-pay | Admitting: Medical

## 2021-08-09 ENCOUNTER — Other Ambulatory Visit (HOSPITAL_BASED_OUTPATIENT_CLINIC_OR_DEPARTMENT_OTHER): Payer: Self-pay

## 2021-08-09 DIAGNOSIS — H5213 Myopia, bilateral: Secondary | ICD-10-CM | POA: Diagnosis not present

## 2021-08-09 DIAGNOSIS — H52223 Regular astigmatism, bilateral: Secondary | ICD-10-CM | POA: Diagnosis not present

## 2021-08-09 MED ORDER — LISDEXAMFETAMINE DIMESYLATE 50 MG PO CAPS
50.0000 mg | ORAL_CAPSULE | Freq: Every day | ORAL | 0 refills | Status: DC
  Filled 2021-08-09 – 2021-08-11 (×2): qty 30, 30d supply, fill #0

## 2021-08-09 MED ORDER — CHLORTHALIDONE 25 MG PO TABS
ORAL_TABLET | Freq: Every day | ORAL | 5 refills | Status: DC
  Filled 2021-08-09: qty 30, 30d supply, fill #0
  Filled 2021-09-06: qty 30, 30d supply, fill #1
  Filled 2021-10-05: qty 30, 30d supply, fill #2
  Filled 2021-11-03: qty 30, 30d supply, fill #3
  Filled 2021-12-03: qty 30, 30d supply, fill #4
  Filled 2022-01-02: qty 30, 30d supply, fill #5

## 2021-08-09 MED ORDER — DESVENLAFAXINE SUCCINATE ER 50 MG PO TB24
ORAL_TABLET | Freq: Every day | ORAL | 3 refills | Status: DC
  Filled 2021-08-09: qty 30, 30d supply, fill #0
  Filled 2021-09-06: qty 30, 30d supply, fill #1
  Filled 2021-10-05: qty 30, 30d supply, fill #2
  Filled 2021-11-03: qty 30, 30d supply, fill #3

## 2021-08-09 NOTE — Telephone Encounter (Addendum)
Requesting: vyvanse ?Contract:09/29/20 ?UDS:09/29/20 ?Last Visit:03/11/21 ?Next Visit:n/a ?Last Refill:07/12/21 ? ?Please Advise  ? ?Refilled rx today. She needs controlled med visit next month. Need visit at least every 6 months. Please get her scheduled. ?Esperanza Richters, PA-C  ?

## 2021-08-10 ENCOUNTER — Other Ambulatory Visit (HOSPITAL_BASED_OUTPATIENT_CLINIC_OR_DEPARTMENT_OTHER): Payer: Self-pay

## 2021-08-10 ENCOUNTER — Ambulatory Visit: Payer: 59 | Attending: Medical | Admitting: Pharmacist

## 2021-08-10 ENCOUNTER — Other Ambulatory Visit (HOSPITAL_COMMUNITY): Payer: Self-pay

## 2021-08-10 DIAGNOSIS — Z79899 Other long term (current) drug therapy: Secondary | ICD-10-CM

## 2021-08-10 MED ORDER — SKYRIZI PEN 150 MG/ML ~~LOC~~ SOAJ
SUBCUTANEOUS | 5 refills | Status: AC
Start: 2021-08-10 — End: ?
  Filled 2021-08-10: qty 1, 28d supply, fill #0
  Filled 2021-09-06: qty 1, 28d supply, fill #1
  Filled 2021-09-07: qty 1, 84d supply, fill #1
  Filled 2021-11-29: qty 1, 84d supply, fill #2
  Filled 2022-02-15: qty 1, 84d supply, fill #3
  Filled 2022-05-11: qty 1, 84d supply, fill #4
  Filled 2022-08-08: qty 1, 84d supply, fill #5

## 2021-08-10 NOTE — Progress Notes (Signed)
? ?  S: ?Patient presents for review of their specialty medication therapy. ? ?Patient is about to start taking Skyrizi for psoriasis. Patient is managed by Dr. Melida Quitter for this.  ? ?Adherence: has not yet started ? ?Efficacy: has not yet started ? ?Dosing: Plaque psoriasis, moderate to severe: SubQ: Two consecutive injections (75 mg each) for a total dose of 150 mg at weeks 0, 4, and then every 12 weeks thereafter. ? ?Screening: ?TB test: completed  ? ?Monitoring: ?S/sx of infection: none ?S/sx of hypersensitivity/injection site reaction: none ? ? ?O: ?   ? ?Lab Results  ?Component Value Date  ? WBC 9.8 12/28/2020  ? HGB 15.4 (H) 12/28/2020  ? HCT 44.5 12/28/2020  ? MCV 92.9 12/28/2020  ? PLT 356 12/28/2020  ? ? ?  Chemistry   ?   ?Component Value Date/Time  ? NA 139 12/28/2020 0949  ? K 3.3 (L) 12/28/2020 0949  ? CL 100 12/28/2020 0949  ? CO2 30 12/28/2020 0949  ? BUN 16 12/28/2020 0949  ? CREATININE 0.63 12/28/2020 0949  ?    ?Component Value Date/Time  ? CALCIUM 9.1 12/28/2020 0949  ? ALKPHOS 73 03/09/2020 1517  ? AST 14 03/09/2020 1517  ? ALT 18 03/09/2020 1517  ? BILITOT 0.5 03/09/2020 1517  ?  ? ? ? ?A/P: ?1. Medication review: Patient is about to start [facility] for psoriasis. Reviewed the medication with the patient, including the following: Cristy Folks is a monoclonal antibody used in the treatment of psoriasis. Patient educated on purpose, proper use and potential adverse effects of Skyrizi. Possible adverse effects are infections, headache, and injection site reactions. Live vaccinations should be avoided while on therapy. SubQ: Administer the two consecutive injections subcutaneously at different anatomic locations, such as thighs, abdomen, or back of upper arms. Do not inject into areas where the skin is tender, bruised, red, hard, or affected by psoriasis. Intended for use under supervision of a health care professional; self-injection may occur after proper training (except back of upper arms). No  recommendations for any changes at this time. ? ?Butch Penny, PharmD, BCACP, CPP ?Clinical Pharmacist ?[facility] ?[phone] ? ? ? ? ? ? ? ? ? ? ? ?

## 2021-08-11 ENCOUNTER — Other Ambulatory Visit (HOSPITAL_BASED_OUTPATIENT_CLINIC_OR_DEPARTMENT_OTHER): Payer: Self-pay

## 2021-08-31 ENCOUNTER — Other Ambulatory Visit (HOSPITAL_COMMUNITY): Payer: Self-pay

## 2021-08-31 ENCOUNTER — Ambulatory Visit: Payer: 59 | Admitting: Medical

## 2021-09-06 ENCOUNTER — Other Ambulatory Visit: Payer: Self-pay | Admitting: Medical

## 2021-09-06 ENCOUNTER — Other Ambulatory Visit (HOSPITAL_BASED_OUTPATIENT_CLINIC_OR_DEPARTMENT_OTHER): Payer: Self-pay

## 2021-09-06 ENCOUNTER — Other Ambulatory Visit (HOSPITAL_COMMUNITY): Payer: Self-pay

## 2021-09-06 MED ORDER — GABAPENTIN 600 MG PO TABS
600.0000 mg | ORAL_TABLET | Freq: Every day | ORAL | 0 refills | Status: DC
  Filled 2021-09-06: qty 30, 30d supply, fill #0

## 2021-09-06 NOTE — Telephone Encounter (Signed)
Requesting: Vyvanse 50mg   Contract: 09/29/20 UDS: 09/29/20 Last Visit: 03/11/21 Next Visit: None Last Refill: 08/09/21 #30 and 0RF  Please Advise

## 2021-09-07 ENCOUNTER — Other Ambulatory Visit (HOSPITAL_BASED_OUTPATIENT_CLINIC_OR_DEPARTMENT_OTHER): Payer: Self-pay

## 2021-09-07 ENCOUNTER — Other Ambulatory Visit (HOSPITAL_COMMUNITY): Payer: Self-pay

## 2021-09-07 NOTE — Telephone Encounter (Signed)
Pt sent mychart message to schedule an appt

## 2021-09-08 ENCOUNTER — Other Ambulatory Visit (HOSPITAL_COMMUNITY): Payer: Self-pay

## 2021-09-12 ENCOUNTER — Other Ambulatory Visit (HOSPITAL_COMMUNITY): Payer: Self-pay

## 2021-09-19 ENCOUNTER — Other Ambulatory Visit: Payer: Self-pay | Admitting: Medical

## 2021-09-20 ENCOUNTER — Ambulatory Visit: Payer: 59 | Admitting: Medical

## 2021-09-20 NOTE — Telephone Encounter (Signed)
Requesting: vyvanse Contract: 09/29/20 UDS:09/29/20 Last Visit:03/11/21 Next Visit:09/23/2021 Last Refill:08/09/21  Please Advise

## 2021-09-21 ENCOUNTER — Other Ambulatory Visit: Payer: Self-pay | Admitting: Medical

## 2021-09-21 ENCOUNTER — Other Ambulatory Visit (HOSPITAL_BASED_OUTPATIENT_CLINIC_OR_DEPARTMENT_OTHER): Payer: Self-pay

## 2021-09-21 NOTE — Telephone Encounter (Addendum)
Requesting:vyvanse 50 mg Contract: 09/29/20 UDS:09/29/20 Last Visit:03/11/21 Next Visit:09/23/21 Last Refill:08/09/21  Please Advise   Pt coming in today for controlled med visit. Will fill when on visit.  Esperanza Richters, PA-C

## 2021-09-22 ENCOUNTER — Encounter: Payer: Self-pay | Admitting: Medical

## 2021-09-22 NOTE — Telephone Encounter (Signed)
Pt stated she will be at appt tomorrow

## 2021-09-23 ENCOUNTER — Ambulatory Visit: Payer: 59 | Admitting: Medical

## 2021-09-23 ENCOUNTER — Encounter: Payer: Self-pay | Admitting: Medical

## 2021-09-23 ENCOUNTER — Telehealth: Payer: Self-pay

## 2021-09-23 ENCOUNTER — Other Ambulatory Visit (HOSPITAL_BASED_OUTPATIENT_CLINIC_OR_DEPARTMENT_OTHER): Payer: Self-pay

## 2021-09-23 VITALS — BP 124/82 | HR 88 | Temp 97.9°F | Resp 16 | Ht 63.5 in | Wt 234.2 lb

## 2021-09-23 DIAGNOSIS — G2581 Restless legs syndrome: Secondary | ICD-10-CM | POA: Diagnosis not present

## 2021-09-23 DIAGNOSIS — F900 Attention-deficit hyperactivity disorder, predominantly inattentive type: Secondary | ICD-10-CM | POA: Diagnosis not present

## 2021-09-23 DIAGNOSIS — L409 Psoriasis, unspecified: Secondary | ICD-10-CM

## 2021-09-23 DIAGNOSIS — I10 Essential (primary) hypertension: Secondary | ICD-10-CM

## 2021-09-23 DIAGNOSIS — F32A Depression, unspecified: Secondary | ICD-10-CM

## 2021-09-23 DIAGNOSIS — E669 Obesity, unspecified: Secondary | ICD-10-CM

## 2021-09-23 DIAGNOSIS — Z124 Encounter for screening for malignant neoplasm of cervix: Secondary | ICD-10-CM

## 2021-09-23 DIAGNOSIS — Z79899 Other long term (current) drug therapy: Secondary | ICD-10-CM | POA: Diagnosis not present

## 2021-09-23 DIAGNOSIS — G4700 Insomnia, unspecified: Secondary | ICD-10-CM

## 2021-09-23 DIAGNOSIS — Z1211 Encounter for screening for malignant neoplasm of colon: Secondary | ICD-10-CM | POA: Diagnosis not present

## 2021-09-23 MED ORDER — LISDEXAMFETAMINE DIMESYLATE 50 MG PO CAPS
50.0000 mg | ORAL_CAPSULE | Freq: Every day | ORAL | 0 refills | Status: DC
  Filled 2021-09-23: qty 30, 30d supply, fill #0

## 2021-09-23 NOTE — Progress Notes (Signed)
Subjective:    Patient ID: [name], female    DOB: Jun 09, 1960, 61 y.o.   MRN: [id number]  HPI  Pt in for follow up/controlled med visit for vyvanse/add.   Hx of ADD. She is on vyvanse 50 mg daily. Up to date on contract and UDS. Pt just now running out of her last tabs.   Htn- bp good level.   For insomnia and restless leg rx gabapentin.600 mg at night. Working fine.   Depression- pt mood stable. She is on prestique.   Pt was on skyrzi for psoriasis. Pt needs to follow up with dermatologist.    Obese pt and she want to lose weight. No hx of pancreatitis. No family hx of thyromedullary cancer.   Review of Systems  Constitutional:  Negative for chills, fatigue and fever.  HENT:  Negative for congestion, drooling and ear pain.   Respiratory:  Negative for cough, chest tightness, shortness of breath, wheezing and stridor.   Cardiovascular:  Negative for chest pain and palpitations.  Gastrointestinal:  Negative for abdominal pain, constipation, diarrhea and nausea.  Genitourinary:  Negative for dysuria.  Musculoskeletal:  Negative for back pain and joint swelling.  Skin:  Negative for rash.  Neurological:  Negative for seizures, syncope, facial asymmetry, weakness and numbness.       Restless leg.  Hematological:  Negative for adenopathy. Does not bruise/bleed easily.  Psychiatric/Behavioral:  Negative for behavioral problems, dysphoric mood, hallucinations, sleep disturbance and suicidal ideas. The patient is not nervous/anxious.    Past Medical History:  Diagnosis Date   ADD (attention deficit disorder)    Arthritis    Depression    GERD (gastroesophageal reflux disease)    Hypertension    Psoriasis    Restless leg syndrome      Social History   Socioeconomic History   Marital status: Divorced    Spouse name: Not on file   Number of children: Not on file   Years of education: Not on file   Highest education level: Not on file  Occupational History   Not on  file  Tobacco Use   Smoking status: Never   Smokeless tobacco: Never  Vaping Use   Vaping Use: Never used  Substance and Sexual Activity   Alcohol use: Yes    Alcohol/week: 3.0 standard drinks of alcohol    Types: 3 Glasses of wine per week    Comment: social   Drug use: No   Sexual activity: Not on file  Other Topics Concern   Not on file  Social History Narrative   Not on file   Social Determinants of Health   Financial Resource Strain: Not on file  Food Insecurity: Not on file  Transportation Needs: Not on file  Physical Activity: Not on file  Stress: Not on file  Social Connections: Not on file  Intimate Partner Violence: Not on file    Past Surgical History:  Procedure Laterality Date   CESAREAN SECTION     DILATION AND CURETTAGE OF UTERUS  1998   TONSILLECTOMY     TOTAL KNEE ARTHROPLASTY Left 12/30/2020   Procedure: LEFT TOTAL KNEE ARTHROPLASTY;  Surgeon: Cammy Copa, MD;  Location: [facility] OR;  Service: Orthopedics;  Laterality: Left;    Family History  Problem Relation Age of Onset   Cancer Mother    Mental illness Father    Alcoholism Father    Mental illness Sister    Suicidality Sister    Healthy Son  Healthy Son    Healthy Daughter     No Known Allergies  Current Outpatient Medications on File Prior to Visit  Medication Sig Dispense Refill   chlorthalidone (HYGROTON) 25 MG tablet TAKE 1 TABLET BY MOUTH ONCE DAILY 30 tablet 5   desvenlafaxine (PRISTIQ) 50 MG 24 hr tablet TAKE 1 TABLET (50 MG TOTAL) BY MOUTH DAILY. 30 tablet 3   gabapentin (NEURONTIN) 600 MG tablet Take 1 tablet (600 mg total) by mouth at bedtime. 30 tablet 0   lisdexamfetamine (VYVANSE) 50 MG capsule Take 1 capsule (50 mg total) by mouth daily. 30 capsule 0   olmesartan (BENICAR) 20 MG tablet Take 1 tablet (20 mg total) by mouth daily. 30 tablet 3   omeprazole (PRILOSEC) 20 MG capsule TAKE 1 CAPSULE (20 MG TOTAL) BY MOUTH DAILY. 30 capsule 6   potassium chloride (KLOR-CON) 10  MEQ tablet TAKE 1 TABLET BY MOUTH ON MONDAY, WEDNESDAY AND FRIDAY 36 tablet 3   Risankizumab-rzaa (SKYRIZI PEN) 150 MG/ML SOAJ Inject one pen on day 0 and then on week 4 then every 12 weeks after 1 mL 5   No current facility-administered medications on file prior to visit.    BP 124/82 (BP Location: Left Arm, Cuff Size: Large)   Pulse (!) 101   Temp 97.9 F (36.6 C) (Oral)   Resp 16   Ht 5' 3.5" (1.613 m)   Wt 234 lb 3.2 oz (106.2 kg)   LMP 12/25/2012 (Approximate)   SpO2 100%   BMI 40.84 kg/m        Objective:   Physical Exam  General Mental Status- Alert. General Appearance- Not in acute distress.   Skin General: Color- Normal Color. Moisture- Normal Moisture.  Neck Carotid Arteries- Normal color. Moisture- Normal Moisture. No carotid bruits. No JVD.  Chest and Lung Exam Auscultation: Breath Sounds:-Normal.  Cardiovascular Auscultation:Rythm- Regular. Murmurs & Other Heart Sounds:Auscultation of the heart reveals- No Murmurs.  Abdomen Inspection:-Inspeection Normal. Palpation/Percussion:Note:No mass. Palpation and Percussion of the abdomen reveal- Non Tender, Non Distended + BS, no rebound or guarding.   Neurologic Cranial Nerve exam:- CN III-XII intact(No nystagmus), symmetric smile. Strength:- 5/5 equal and symmetric strength both upper and lower extremities.       Assessment & Plan:   ADD-well-controlled on medication Vyvanse.  Up-to-date on contract and UDS.  Refilled medication today.   Htn-blood pressure very well controlled today.     Depression-mood well controlled.  Continue Pristiq.   Psoriasis-psoriasis follow-up with dermatologist.   Obese-recommend diet and exercise.   For restless leg increase your gabapentin to 600 mg at night.   Follow-up in 3 to 4 months or sooner if needed.

## 2021-09-23 NOTE — Telephone Encounter (Signed)
Opened to create controlled contract

## 2021-09-23 NOTE — Patient Instructions (Addendum)
ADD-well-controlled on medication Vyvanse.  Up-to-date on contract and UDS.  Refilled medication today.   Htn-blood pressure very well controlled today.     Depression-mood well controlled.  Continue Pristiq.   Psoriasis-psoriasis follow-up with dermatologist.   Obese-recommend diet and exercise.   For restless leg increase your gabapentin to 600 mg at night.   Follow-up in 3 to 4 months or sooner if needed.  Placed referral to GI MD(for colonoscopy) and gynecologist(for pap)

## 2021-09-25 LAB — DRUG MONITORING PANEL 376104, URINE
Amphetamine: 1699 ng/mL — ABNORMAL HIGH (ref <250)
Amphetamines: POSITIVE ng/mL — AB (ref <500)
Barbiturates: NEGATIVE ng/mL (ref <300)
Benzodiazepines: NEGATIVE ng/mL (ref <100)
Cocaine Metabolite: NEGATIVE ng/mL (ref <150)
Desmethyltramadol: NEGATIVE ng/mL (ref <100)
Methamphetamine: NEGATIVE ng/mL (ref <250)
Opiates: NEGATIVE ng/mL (ref <100)
Oxycodone: NEGATIVE ng/mL (ref <100)
Tramadol: NEGATIVE ng/mL (ref <100)

## 2021-09-25 LAB — DM TEMPLATE

## 2021-10-05 ENCOUNTER — Other Ambulatory Visit (HOSPITAL_BASED_OUTPATIENT_CLINIC_OR_DEPARTMENT_OTHER): Payer: Self-pay

## 2021-10-05 ENCOUNTER — Other Ambulatory Visit: Payer: Self-pay | Admitting: Medical

## 2021-10-05 MED ORDER — GABAPENTIN 600 MG PO TABS
600.0000 mg | ORAL_TABLET | Freq: Every day | ORAL | 1 refills | Status: DC
  Filled 2021-10-05: qty 90, 90d supply, fill #0
  Filled 2022-01-02: qty 90, 90d supply, fill #1

## 2021-10-05 MED ORDER — OLMESARTAN MEDOXOMIL 20 MG PO TABS
20.0000 mg | ORAL_TABLET | Freq: Every day | ORAL | 3 refills | Status: DC
  Filled 2021-10-05: qty 30, 30d supply, fill #0
  Filled 2021-11-03: qty 30, 30d supply, fill #1
  Filled 2021-12-03: qty 30, 30d supply, fill #2
  Filled 2022-01-02: qty 30, 30d supply, fill #3

## 2021-10-05 MED ORDER — OMEPRAZOLE 20 MG PO CPDR
DELAYED_RELEASE_CAPSULE | Freq: Every day | ORAL | 6 refills | Status: DC
Start: 2021-10-05 — End: 2022-05-03
  Filled 2021-10-05: qty 30, 30d supply, fill #0
  Filled 2021-11-03: qty 30, 30d supply, fill #1
  Filled 2021-12-03: qty 30, 30d supply, fill #2
  Filled 2022-01-02: qty 30, 30d supply, fill #3
  Filled 2022-02-02: qty 30, 30d supply, fill #4
  Filled 2022-03-03: qty 30, 30d supply, fill #5
  Filled 2022-04-02: qty 30, 30d supply, fill #6

## 2021-10-23 ENCOUNTER — Other Ambulatory Visit: Payer: Self-pay | Admitting: Medical

## 2021-10-24 ENCOUNTER — Other Ambulatory Visit (HOSPITAL_BASED_OUTPATIENT_CLINIC_OR_DEPARTMENT_OTHER): Payer: Self-pay

## 2021-10-24 MED ORDER — LISDEXAMFETAMINE DIMESYLATE 50 MG PO CAPS
50.0000 mg | ORAL_CAPSULE | Freq: Every day | ORAL | 0 refills | Status: DC
  Filled 2021-10-24: qty 30, 30d supply, fill #0

## 2021-10-24 NOTE — Telephone Encounter (Addendum)
Requesting: vyvanse Contract: 09/23/21 UDS:09/23/21 Last Visit:09/23/21 Next Visit:n/a Last Refill:09/23/21  Please Advise   Rx refill sent to pt pharmacy.  Esperanza Richters, PA-C

## 2021-11-03 ENCOUNTER — Other Ambulatory Visit (HOSPITAL_BASED_OUTPATIENT_CLINIC_OR_DEPARTMENT_OTHER): Payer: Self-pay

## 2021-11-08 ENCOUNTER — Ambulatory Visit: Payer: 59 | Admitting: Medical

## 2021-11-10 ENCOUNTER — Other Ambulatory Visit (HOSPITAL_BASED_OUTPATIENT_CLINIC_OR_DEPARTMENT_OTHER): Payer: Self-pay

## 2021-11-10 ENCOUNTER — Telehealth: Payer: Self-pay | Admitting: Orthopedic Surgery

## 2021-11-10 MED ORDER — AMOXICILLIN 500 MG PO CAPS
ORAL_CAPSULE | ORAL | 0 refills | Status: DC
  Filled 2021-11-10: qty 10, 3d supply, fill #0

## 2021-11-10 NOTE — Telephone Encounter (Signed)
Submitted rx. Tried calling patient to advise. No answer.

## 2021-11-10 NOTE — Telephone Encounter (Signed)
Patient called advised she is having her teeth cleaned and will need an antibiotic sent to her pharmacy. Patient uses the pharmacy at [facility]. The number to contact patient is [phone]

## 2021-11-24 ENCOUNTER — Other Ambulatory Visit: Payer: Self-pay | Admitting: Medical

## 2021-11-24 NOTE — Telephone Encounter (Addendum)
Requesting: Vyvanse 50mg   Contract: 09/23/21 UDS: 09/23/21 Last Visit: 09/23/21 Next Visit: None Last Refill: 10/24/21 #30 and 0RF  Please Advise Rx refill sent to pharmacy.  10/26/21, PA-C

## 2021-11-26 MED ORDER — LISDEXAMFETAMINE DIMESYLATE 50 MG PO CAPS
50.0000 mg | ORAL_CAPSULE | Freq: Every day | ORAL | 0 refills | Status: DC
  Filled 2021-11-26: qty 30, 30d supply, fill #0

## 2021-11-29 ENCOUNTER — Other Ambulatory Visit (HOSPITAL_COMMUNITY): Payer: Self-pay

## 2021-11-29 ENCOUNTER — Other Ambulatory Visit (HOSPITAL_BASED_OUTPATIENT_CLINIC_OR_DEPARTMENT_OTHER): Payer: Self-pay

## 2021-12-02 ENCOUNTER — Other Ambulatory Visit (HOSPITAL_BASED_OUTPATIENT_CLINIC_OR_DEPARTMENT_OTHER): Payer: Self-pay

## 2021-12-03 ENCOUNTER — Other Ambulatory Visit: Payer: Self-pay | Admitting: Medical

## 2021-12-05 ENCOUNTER — Other Ambulatory Visit (HOSPITAL_BASED_OUTPATIENT_CLINIC_OR_DEPARTMENT_OTHER): Payer: Self-pay

## 2021-12-05 MED ORDER — DESVENLAFAXINE SUCCINATE ER 50 MG PO TB24
50.0000 mg | ORAL_TABLET | Freq: Every day | ORAL | 3 refills | Status: DC
  Filled 2021-12-05: qty 30, 30d supply, fill #0
  Filled 2022-01-02: qty 30, 30d supply, fill #1
  Filled 2022-02-02: qty 30, 30d supply, fill #2
  Filled 2022-03-03: qty 30, 30d supply, fill #3

## 2021-12-06 ENCOUNTER — Other Ambulatory Visit (HOSPITAL_COMMUNITY): Payer: Self-pay

## 2021-12-07 ENCOUNTER — Ambulatory Visit: Payer: 59 | Admitting: Family Medicine

## 2021-12-26 ENCOUNTER — Emergency Department (HOSPITAL_BASED_OUTPATIENT_CLINIC_OR_DEPARTMENT_OTHER): Payer: 59

## 2021-12-26 ENCOUNTER — Encounter (HOSPITAL_BASED_OUTPATIENT_CLINIC_OR_DEPARTMENT_OTHER): Payer: Self-pay | Admitting: Emergency Medicine

## 2021-12-26 ENCOUNTER — Ambulatory Visit (INDEPENDENT_AMBULATORY_CARE_PROVIDER_SITE_OTHER): Payer: Self-pay

## 2021-12-26 ENCOUNTER — Other Ambulatory Visit: Payer: Self-pay | Admitting: Family

## 2021-12-26 ENCOUNTER — Other Ambulatory Visit: Payer: Self-pay

## 2021-12-26 ENCOUNTER — Emergency Department (HOSPITAL_BASED_OUTPATIENT_CLINIC_OR_DEPARTMENT_OTHER)
Admission: EM | Admit: 2021-12-26 | Discharge: 2021-12-26 | Disposition: A | Discharge status: Home / Self Care | Payer: PRIVATE HEALTH INSURANCE | Attending: Emergency Medicine | Admitting: Emergency Medicine

## 2021-12-26 DIAGNOSIS — Y990 Civilian activity done for income or pay: Secondary | ICD-10-CM | POA: Insufficient documentation

## 2021-12-26 DIAGNOSIS — R52 Pain, unspecified: Secondary | ICD-10-CM

## 2021-12-26 DIAGNOSIS — S32010A Wedge compression fracture of first lumbar vertebra, initial encounter for closed fracture: Secondary | ICD-10-CM | POA: Diagnosis not present

## 2021-12-26 DIAGNOSIS — R0602 Shortness of breath: Secondary | ICD-10-CM | POA: Diagnosis not present

## 2021-12-26 DIAGNOSIS — K824 Cholesterolosis of gallbladder: Secondary | ICD-10-CM | POA: Diagnosis not present

## 2021-12-26 DIAGNOSIS — M954 Acquired deformity of chest and rib: Secondary | ICD-10-CM | POA: Diagnosis not present

## 2021-12-26 DIAGNOSIS — W1839XA Other fall on same level, initial encounter: Secondary | ICD-10-CM | POA: Insufficient documentation

## 2021-12-26 DIAGNOSIS — R079 Chest pain, unspecified: Secondary | ICD-10-CM

## 2021-12-26 DIAGNOSIS — S3992XA Unspecified injury of lower back, initial encounter: Secondary | ICD-10-CM | POA: Diagnosis present

## 2021-12-26 DIAGNOSIS — R000 Tachycardia, unspecified: Secondary | ICD-10-CM | POA: Diagnosis not present

## 2021-12-26 DIAGNOSIS — S301XXA Contusion of abdominal wall, initial encounter: Secondary | ICD-10-CM | POA: Diagnosis not present

## 2021-12-26 LAB — URINALYSIS, MICROSCOPIC (REFLEX)

## 2021-12-26 LAB — BASIC METABOLIC PANEL
Anion gap: 12 (ref 5–15)
BUN: 27 mg/dL — ABNORMAL HIGH (ref 6–20)
CO2: 28 mmol/L (ref 22–32)
Calcium: 8.9 mg/dL (ref 8.9–10.3)
Chloride: 99 mmol/L (ref 98–111)
Creatinine, Ser: 0.78 mg/dL (ref 0.44–1.00)
GFR, Estimated: >60 mL/min (ref >60)
Glucose, Bld: 117 mg/dL — ABNORMAL HIGH (ref 70–99)
Potassium: 3.2 mmol/L — ABNORMAL LOW (ref 3.5–5.1)
Sodium: 139 mmol/L (ref 135–145)

## 2021-12-26 LAB — CBC WITH DIFFERENTIAL/PLATELET
Abs Immature Granulocytes: 0.04 10*3/uL (ref 0.00–0.07)
Basophils Absolute: 0.1 10*3/uL (ref 0.0–0.1)
Basophils Relative: 1 %
Eosinophils Absolute: 0.2 10*3/uL (ref 0.0–0.5)
Eosinophils Relative: 2 %
HCT: 43.6 % (ref 36.0–46.0)
Hemoglobin: 14.9 g/dL (ref 12.0–15.0)
Immature Granulocytes: 0 %
Lymphocytes Relative: 25 %
Lymphs Abs: 2.4 10*3/uL (ref 0.7–4.0)
MCH: 31.6 pg (ref 26.0–34.0)
MCHC: 34.2 g/dL (ref 30.0–36.0)
MCV: 92.6 fL (ref 80.0–100.0)
Monocytes Absolute: 0.8 10*3/uL (ref 0.1–1.0)
Monocytes Relative: 9 %
Neutro Abs: 6 10*3/uL (ref 1.7–7.7)
Neutrophils Relative %: 63 %
Platelets: 303 10*3/uL (ref 150–400)
RBC: 4.71 MIL/uL (ref 3.87–5.11)
RDW: 13.7 % (ref 11.5–15.5)
WBC: 9.5 10*3/uL (ref 4.0–10.5)
nRBC: 0 % (ref 0.0–0.2)

## 2021-12-26 LAB — URINALYSIS, ROUTINE W REFLEX MICROSCOPIC
Glucose, UA: NEGATIVE mg/dL
Ketones, ur: NEGATIVE mg/dL
Leukocytes,Ua: NEGATIVE
Nitrite: NEGATIVE
Protein, ur: NEGATIVE mg/dL
Specific Gravity, Urine: 1.015 (ref 1.005–1.030)
pH: 6 (ref 5.0–8.0)

## 2021-12-26 LAB — PROTIME-INR
INR: 0.9 (ref 0.8–1.2)
Prothrombin Time: 11.7 seconds (ref 11.4–15.2)

## 2021-12-26 MED ORDER — IOHEXOL 300 MG/ML  SOLN
100.0000 mL | Freq: Once | INTRAMUSCULAR | Status: AC | PRN
  Administered 2021-12-26: 100 mL via INTRAVENOUS

## 2021-12-26 NOTE — ED Notes (Signed)
Pt transported to CT ?

## 2021-12-26 NOTE — Discharge Instructions (Signed)
You were seen in the emergency department for bruising in your left flank after a fall.  There was also concerns of a rib fracture on your chest x-ray.  You had a CAT scan of your chest abdomen and pelvis that did not show any rib fracture.  You had an age indeterminant lumbar compression fracture.  There were other incidental findings that will need follow-up.  Please continue Tylenol and ibuprofen as needed for pain.  Follow-up with employee health.  Return to the emergency department if any worsening or concerning symptoms

## 2021-12-26 NOTE — ED Provider Notes (Signed)
[facility] EMERGENCY DEPARTMENT Provider Note   CSN: [id number] Arrival date & time: 12/26/21  1310     History  Chief Complaint  Patient presents with   Shortness of Breath    [name] is a 61 y.o. female.  She is here for evaluation of left flank pain and shortness of breath that started after an injury 3 days ago.  She was at work and hit the corner of an EKG machine onto her left flank which caused her to fall to the ground.  No loss consciousness.  Since then she has had pain in her side.  She went to employee health today who did a chest x-ray that showed a possible rib fracture and referred here for further evaluation.  She is not on any blood thinners.  Complaining of moderate pain.  The history is provided by the patient.  Shortness of Breath Severity:  Moderate Onset quality:  Sudden Duration:  3 days Timing:  Constant Progression:  Unchanged Chronicity:  New Relieved by:  Nothing Worsened by:  Activity and coughing Ineffective treatments:  Rest and lying down Associated symptoms: chest pain   Associated symptoms: no abdominal pain and no fever        Home Medications Prior to Admission medications   Medication Sig Start Date End Date Taking? Authorizing Provider  amoxicillin (AMOXIL) 500 MG capsule Take 4 capsules by mouth 1 hour prior to dental procedure 11/10/21   Meredith Pel, MD  chlorthalidone (HYGROTON) 25 MG tablet TAKE 1 TABLET BY MOUTH ONCE DAILY 08/09/21 08/09/22  Saguier, Percell Miller, PA-C  desvenlafaxine (PRISTIQ) 50 MG 24 hr tablet Take 1 tablet (50 mg total) by mouth daily. 12/05/21 12/05/22  Saguier, Percell Miller, PA-C  gabapentin (NEURONTIN) 600 MG tablet Take 1 tablet (600 mg total) by mouth at bedtime. 10/05/21   Saguier, Percell Miller, PA-C  lisdexamfetamine (VYVANSE) 50 MG capsule Take 1 capsule (50 mg total) by mouth daily. 11/26/21   Saguier, Percell Miller, PA-C  olmesartan (BENICAR) 20 MG tablet Take 1 tablet (20 mg total) by mouth daily. 10/05/21    Saguier, Percell Miller, PA-C  omeprazole (PRILOSEC) 20 MG capsule TAKE 1 CAPSULE (20 MG TOTAL) BY MOUTH DAILY. 10/05/21 10/05/22  Saguier, Percell Miller, PA-C  potassium chloride (KLOR-CON) 10 MEQ tablet TAKE 1 TABLET BY MOUTH ON [facility] AND FRIDAY 05/11/21 05/11/22  Saguier, Percell Miller, PA-C  Risankizumab-rzaa [facility] PEN) 150 MG/ML SOAJ Inject one pen on day 0 and then on week 4 then every 12 weeks after 08/10/21   Tresa Garter, MD      Allergies    Patient has no known allergies.    Review of Systems   Review of Systems  Constitutional:  Negative for fever.  Respiratory:  Positive for shortness of breath.   Cardiovascular:  Positive for chest pain.  Gastrointestinal:  Negative for abdominal pain.  Genitourinary:  Positive for flank pain. Negative for hematuria.    Physical Exam Updated Vital Signs BP (!) 146/95 (BP Location: Right Arm)   Pulse (!) 116   Temp (!) 97.5 F (36.4 C) (Oral)   Resp (!) 24   LMP 12/25/2012 (Approximate)   SpO2 99%  Physical Exam Vitals and nursing note reviewed.  Constitutional:      General: She is not in acute distress.    Appearance: Normal appearance. She is well-developed.  HENT:     Head: Normocephalic and atraumatic.  Eyes:     Conjunctiva/sclera: Conjunctivae normal.  Cardiovascular:     Rate  and Rhythm: Regular rhythm. Tachycardia present.     Heart sounds: No murmur heard. Pulmonary:     Effort: Pulmonary effort is normal. No respiratory distress.     Breath sounds: Normal breath sounds.  Abdominal:     Palpations: Abdomen is soft.     Tenderness: There is no abdominal tenderness.  Musculoskeletal:        General: No swelling.     Cervical back: Neck supple.     Comments: She has approximately a 15 cm area of ecchymosis on left lower flank.  Skin:    General: Skin is warm and dry.     Capillary Refill: Capillary refill takes less than 2 seconds.  Neurological:     General: No focal deficit present.     Mental Status: She is  alert.     ED Results / Procedures / Treatments   Labs (all labs ordered are listed, but only abnormal results are displayed) Labs Reviewed  BASIC METABOLIC PANEL - Abnormal; Notable for the following components:      Result Value   Potassium 3.2 (*)    Glucose, Bld 117 (*)    BUN 27 (*)    All other components within normal limits  URINALYSIS, ROUTINE W REFLEX MICROSCOPIC - Abnormal; Notable for the following components:   Hgb urine dipstick SMALL (*)    Bilirubin Urine SMALL (*)    All other components within normal limits  URINALYSIS, MICROSCOPIC (REFLEX) - Abnormal; Notable for the following components:   Bacteria, UA FEW (*)    All other components within normal limits  CBC WITH DIFFERENTIAL/PLATELET  PROTIME-INR    EKG None  Radiology CT CHEST ABDOMEN PELVIS W CONTRAST  Result Date: 12/26/2021 CLINICAL DATA:  Abdominal trauma, blunt. Fall. Bruise on left flank EXAM: CT CHEST, ABDOMEN, AND PELVIS WITH CONTRAST TECHNIQUE: Multidetector CT imaging of the chest, abdomen and pelvis was performed following the standard protocol during bolus administration of intravenous contrast. RADIATION DOSE REDUCTION: This exam was performed according to the departmental dose-optimization program which includes automated exposure control, adjustment of the mA and/or kV according to patient size and/or use of iterative reconstruction technique. CONTRAST:  156mL OMNIPAQUE IOHEXOL 300 MG/ML  SOLN COMPARISON:  None Available. FINDINGS: CHEST: Cardiovascular: No aortic injury. The thoracic aorta is normal in caliber. The heart is normal in size. No significant pericardial effusion. Mediastinum/Nodes: No pneumomediastinum. No mediastinal hematoma. The esophagus is unremarkable. The thyroid is unremarkable. The central airways are patent. No mediastinal, hilar, or axillary lymphadenopathy. Lungs/Pleura: No focal consolidation. No pulmonary nodule. No pulmonary mass. No pulmonary contusion or laceration.  No pneumatocele formation. No pleural effusion. No pneumothorax. No hemothorax. Musculoskeletal/Chest wall: No chest wall mass. No acute rib or sternal fracture. No spinal fracture. ABDOMEN / PELVIS: Hepatobiliary: Not enlarged. The hepatic parenchyma is diffusely hypodense compared to the splenic parenchyma consistent with fatty infiltration. no focal lesion. No laceration or subcapsular hematoma. There is a 6 mm density along the gallbladder wall that may represent a polyp versus adherent gallstone. No biliary ductal dilatation. Pancreas: Normal pancreatic contour. No main pancreatic duct dilatation. Spleen: Not enlarged. No focal lesion. No laceration, subcapsular hematoma, or vascular injury. Splenule noted. Adrenals/Urinary Tract: No nodularity bilaterally. Bilateral kidneys enhance symmetrically. No hydronephrosis. No contusion, laceration, or subcapsular hematoma. No injury to the vascular structures or collecting systems. No hydroureter. The urinary bladder is unremarkable. On delayed imaging, there is no urothelial wall thickening and there are no filling defects in the opacified portions  of the bilateral collecting systems or ureters. Stomach/Bowel: No small or large bowel wall thickening or dilatation. Colonic diverticulosis. Status post appendectomy. Vasculature/Lymphatics: No abdominal aorta or iliac aneurysm. No active contrast extravasation or pseudoaneurysm. No abdominal, pelvic, inguinal lymphadenopathy. Reproductive: The uterus and bilateral adnexal regions are unremarkable. Likely C-section scar. Other: No simple free fluid ascites. No pneumoperitoneum. No hemoperitoneum. No mesenteric hematoma identified. No organized fluid collection. Musculoskeletal: Partially visualized mild left flank subcutaneus soft tissue edema and superficial hematoma formation. No acute pelvic fracture. Age-indeterminate mild L1 anterior wedge compression fracture. Ports and Devices: None. IMPRESSION: 1. No acute  traumatic injury to the chest, abdomen, or pelvis. 2. No acute displaced fracture or traumatic listhesis of the thoracic spine. 3. Age-indeterminate mild L1 anterior wedge compression fracture. Recommend correlation with point tenderness to palpation to evaluate for an acute component. Other imaging findings of potential clinical significance: 1. A 6 mm density along the gallbladder wall may represent a polyp versus adherent gallstone-recommend outpatient correlation with right upper quadrant ultrasound. 2. Colonic diverticulosis with no acute diverticulitis. 3. Hepatic steatosis. Electronically Signed   By: Iven Finn M.D.   On: 12/26/2021 15:15   DG Ribs Bilateral W/Chest  Result Date: 12/26/2021 CLINICAL DATA:  Left chest pain, fall EXAM: BILATERAL RIBS AND CHEST - 4+ VIEW COMPARISON:  None Available. FINDINGS: There is minimal deformity in the anterior left sixth rib. There are no focal pulmonary infiltrates. There is no pleural effusion or pneumothorax. IMPRESSION: Deformity in the anterior left sixth rib may suggest recent or old undisplaced fracture. Lung fields are clear. There is no pleural effusion or pneumothorax. Electronically Signed   By: Elmer Picker M.D.   On: 12/26/2021 11:58    Procedures Procedures    Medications Ordered in ED Medications - No data to display  ED Course/ Medical Decision Making/ A&P                           Medical Decision Making Amount and/or Complexity of Data Reviewed Labs: ordered. Radiology: ordered.  Risk Prescription drug management.   This patient complains of left flank pain on; this involves an extensive number of treatment Options and is a complaint that carries with it a high risk of complications and morbidity. The differential includes rib fracture, contusion, musculoskeletal, pneumothorax, retroperitoneal bleed, splenic injury, renal hematoma  I ordered, reviewed and interpreted labs, which included CBC normal, chemistries  with mildly low potassium, urinalysis without clear signs of infection I ordered imaging studies which included CT chest abdomen and pelvis and I independently    visualized and interpreted imaging which showed age-indeterminate L1 compression fracture, soft tissue hematoma Previous records obtained and reviewed in epic including urgent care visit   Social determinants considered, no significant barriers Critical Interventions: None  After the interventions stated above, I reevaluated the patient and found patient to be neurologically intact in no distress Admission and further testing considered, no indications for admission or further work-up at this time.  Patient will continue symptomatic treatment and follow-up with her treatment team.  Return instructions discussed         Final Clinical Impression(s) / ED Diagnoses Final diagnoses:  Hematoma of left flank, initial encounter  Closed compression fracture of body of L1 vertebra ([place])  Gallbladder polyp    Rx / DC Orders ED Discharge Orders     None         Hayden Rasmussen, MD 12/26/21 1723

## 2021-12-26 NOTE — ED Notes (Signed)
D/c paperwork reviewed with pt, including f/u care. No questions or concerns at time of d/c. Pt ambulatory without assistance to ED exit, NAD.

## 2021-12-26 NOTE — ED Triage Notes (Signed)
Pt had a fall on Friday and has huge bruise on  left flank , pt now sob on exertion and very sore has a rib fx

## 2021-12-28 ENCOUNTER — Other Ambulatory Visit (HOSPITAL_COMMUNITY): Payer: Self-pay

## 2022-01-02 ENCOUNTER — Other Ambulatory Visit (HOSPITAL_BASED_OUTPATIENT_CLINIC_OR_DEPARTMENT_OTHER): Payer: Self-pay

## 2022-01-02 ENCOUNTER — Other Ambulatory Visit: Payer: Self-pay | Admitting: Medical

## 2022-01-03 ENCOUNTER — Other Ambulatory Visit (HOSPITAL_BASED_OUTPATIENT_CLINIC_OR_DEPARTMENT_OTHER): Payer: Self-pay

## 2022-01-03 MED ORDER — LISDEXAMFETAMINE DIMESYLATE 50 MG PO CAPS
50.0000 mg | ORAL_CAPSULE | Freq: Every day | ORAL | 0 refills | Status: DC
  Filled 2022-01-03: qty 30, 30d supply, fill #0

## 2022-01-03 NOTE — Telephone Encounter (Addendum)
Requesting: vyvanse Contract: 09/23/21 UDS:09/23/21 Last Visit:09/23/21 Next Visit:n/a Last Refill:11/26/21  Please Advise   Rx refill sent to pharmacy.  Mackie Pai, PA-C

## 2022-01-09 ENCOUNTER — Other Ambulatory Visit (HOSPITAL_BASED_OUTPATIENT_CLINIC_OR_DEPARTMENT_OTHER): Payer: Self-pay

## 2022-01-09 ENCOUNTER — Other Ambulatory Visit (HOSPITAL_COMMUNITY): Payer: Self-pay | Admitting: Orthopedic Surgery

## 2022-01-09 ENCOUNTER — Other Ambulatory Visit (HOSPITAL_BASED_OUTPATIENT_CLINIC_OR_DEPARTMENT_OTHER): Payer: Self-pay | Admitting: Orthopedic Surgery

## 2022-01-09 DIAGNOSIS — M545 Low back pain, unspecified: Secondary | ICD-10-CM

## 2022-01-09 MED ORDER — METHOCARBAMOL 500 MG PO TABS
500.0000 mg | ORAL_TABLET | Freq: Four times a day (QID) | ORAL | 2 refills | Status: DC
  Filled 2022-01-09: qty 60, 8d supply, fill #0
  Filled 2022-01-14: qty 60, 8d supply, fill #1
  Filled 2022-02-02: qty 60, 8d supply, fill #2

## 2022-01-11 ENCOUNTER — Ambulatory Visit (HOSPITAL_BASED_OUTPATIENT_CLINIC_OR_DEPARTMENT_OTHER): Payer: 59

## 2022-01-14 ENCOUNTER — Ambulatory Visit (HOSPITAL_BASED_OUTPATIENT_CLINIC_OR_DEPARTMENT_OTHER)
Admission: RE | Admit: 2022-01-14 | Discharge: 2022-01-14 | Disposition: A | Discharge status: Home / Self Care | Payer: PRIVATE HEALTH INSURANCE | Source: Ambulatory Visit | Attending: Orthopedic Surgery | Admitting: Orthopedic Surgery

## 2022-01-14 DIAGNOSIS — M545 Low back pain, unspecified: Secondary | ICD-10-CM

## 2022-01-16 ENCOUNTER — Other Ambulatory Visit (HOSPITAL_BASED_OUTPATIENT_CLINIC_OR_DEPARTMENT_OTHER): Payer: Self-pay

## 2022-01-24 ENCOUNTER — Other Ambulatory Visit (HOSPITAL_BASED_OUTPATIENT_CLINIC_OR_DEPARTMENT_OTHER): Payer: Self-pay

## 2022-01-24 MED ORDER — FLUARIX QUADRIVALENT 0.5 ML IM SUSY
PREFILLED_SYRINGE | INTRAMUSCULAR | 0 refills | Status: DC
  Filled 2022-01-24: qty 0.5, 1d supply, fill #0

## 2022-01-27 ENCOUNTER — Other Ambulatory Visit (HOSPITAL_BASED_OUTPATIENT_CLINIC_OR_DEPARTMENT_OTHER): Payer: Self-pay

## 2022-02-02 ENCOUNTER — Other Ambulatory Visit: Payer: Self-pay | Admitting: Medical

## 2022-02-03 ENCOUNTER — Other Ambulatory Visit (HOSPITAL_BASED_OUTPATIENT_CLINIC_OR_DEPARTMENT_OTHER): Payer: Self-pay

## 2022-02-03 NOTE — Telephone Encounter (Signed)
Requesting: vyvanse Contract: 09/23/21 UDS:09/23/21 Last Visit:09/23/21 Next Visit:n/a Last Refill:01/03/22  Please Advise   Rx refill sent.  Esperanza Richters, PA-C

## 2022-02-04 MED ORDER — CHLORTHALIDONE 25 MG PO TABS
25.0000 mg | ORAL_TABLET | Freq: Every day | ORAL | 5 refills | Status: DC
  Filled 2022-02-04: qty 30, 30d supply, fill #0
  Filled 2022-03-03: qty 30, 30d supply, fill #1
  Filled 2022-04-02: qty 30, 30d supply, fill #2
  Filled 2022-05-03: qty 30, 30d supply, fill #3
  Filled 2022-06-01 – 2022-06-16 (×2): qty 30, 30d supply, fill #4
  Filled 2022-07-12: qty 30, 30d supply, fill #5

## 2022-02-04 MED ORDER — OLMESARTAN MEDOXOMIL 20 MG PO TABS
20.0000 mg | ORAL_TABLET | Freq: Every day | ORAL | 3 refills | Status: DC
  Filled 2022-02-04: qty 30, 30d supply, fill #0
  Filled 2022-03-03: qty 30, 30d supply, fill #1
  Filled 2022-04-02: qty 30, 30d supply, fill #2
  Filled 2022-05-03: qty 30, 30d supply, fill #3

## 2022-02-04 MED ORDER — LISDEXAMFETAMINE DIMESYLATE 50 MG PO CAPS
50.0000 mg | ORAL_CAPSULE | Freq: Every day | ORAL | 0 refills | Status: DC
  Filled 2022-02-04: qty 30, 30d supply, fill #0

## 2022-02-06 ENCOUNTER — Other Ambulatory Visit (HOSPITAL_BASED_OUTPATIENT_CLINIC_OR_DEPARTMENT_OTHER): Payer: Self-pay

## 2022-02-10 ENCOUNTER — Other Ambulatory Visit (HOSPITAL_COMMUNITY): Payer: Self-pay

## 2022-02-15 ENCOUNTER — Other Ambulatory Visit (HOSPITAL_COMMUNITY): Payer: Self-pay

## 2022-02-20 ENCOUNTER — Other Ambulatory Visit (HOSPITAL_COMMUNITY): Payer: Self-pay

## 2022-03-06 ENCOUNTER — Other Ambulatory Visit (HOSPITAL_BASED_OUTPATIENT_CLINIC_OR_DEPARTMENT_OTHER): Payer: Self-pay

## 2022-03-09 ENCOUNTER — Other Ambulatory Visit (HOSPITAL_BASED_OUTPATIENT_CLINIC_OR_DEPARTMENT_OTHER): Payer: Self-pay

## 2022-03-09 ENCOUNTER — Other Ambulatory Visit: Payer: Self-pay | Admitting: Medical

## 2022-03-09 NOTE — Telephone Encounter (Signed)
Requesting: vyvanse Contract:09/23/21 UDS:09/23/21 Last Visit:09/23/21 Next Visit:n/a Last Refill:02/04/22  Please Advise

## 2022-03-10 ENCOUNTER — Other Ambulatory Visit (HOSPITAL_BASED_OUTPATIENT_CLINIC_OR_DEPARTMENT_OTHER): Payer: Self-pay

## 2022-03-10 MED ORDER — LISDEXAMFETAMINE DIMESYLATE 50 MG PO CAPS
50.0000 mg | ORAL_CAPSULE | Freq: Every day | ORAL | 0 refills | Status: DC
  Filled 2022-03-10: qty 30, 30d supply, fill #0

## 2022-03-10 NOTE — Telephone Encounter (Signed)
Filled rx. She needs to be schedule this month for controlled med visit.

## 2022-03-13 ENCOUNTER — Other Ambulatory Visit (HOSPITAL_BASED_OUTPATIENT_CLINIC_OR_DEPARTMENT_OTHER): Payer: Self-pay

## 2022-03-13 ENCOUNTER — Telehealth: Payer: Self-pay | Admitting: Medical

## 2022-03-13 NOTE — Telephone Encounter (Signed)
Pt called unable to lvm due to full voice mail , will send mychart message

## 2022-03-13 NOTE — Telephone Encounter (Signed)
Attempted to LVM for patient to call back and schedule a overdue follow up appointment. Voicemail was full.

## 2022-03-23 ENCOUNTER — Ambulatory Visit: Payer: 59 | Admitting: Medical

## 2022-03-30 ENCOUNTER — Ambulatory Visit: Payer: Commercial Managed Care - PPO | Admitting: Medical

## 2022-04-02 ENCOUNTER — Other Ambulatory Visit: Payer: Self-pay | Admitting: Medical

## 2022-04-03 ENCOUNTER — Other Ambulatory Visit (HOSPITAL_BASED_OUTPATIENT_CLINIC_OR_DEPARTMENT_OTHER): Payer: Self-pay

## 2022-04-03 ENCOUNTER — Other Ambulatory Visit: Payer: Self-pay

## 2022-04-03 MED ORDER — GABAPENTIN 600 MG PO TABS
600.0000 mg | ORAL_TABLET | Freq: Every day | ORAL | 1 refills | Status: DC
  Filled 2022-04-03: qty 90, 90d supply, fill #0
  Filled 2022-06-30 – 2022-07-03 (×2): qty 90, 90d supply, fill #1

## 2022-04-03 MED ORDER — DESVENLAFAXINE SUCCINATE ER 50 MG PO TB24
50.0000 mg | ORAL_TABLET | Freq: Every day | ORAL | 3 refills | Status: DC
  Filled 2022-04-03: qty 30, 30d supply, fill #0
  Filled 2022-05-03: qty 30, 30d supply, fill #1
  Filled 2022-06-01 – 2022-06-16 (×2): qty 30, 30d supply, fill #2
  Filled 2022-07-12: qty 30, 30d supply, fill #3

## 2022-04-11 ENCOUNTER — Ambulatory Visit: Payer: Commercial Managed Care - PPO | Admitting: Medical

## 2022-04-11 ENCOUNTER — Other Ambulatory Visit (HOSPITAL_BASED_OUTPATIENT_CLINIC_OR_DEPARTMENT_OTHER): Payer: Self-pay

## 2022-04-11 VITALS — BP 116/78 | HR 100 | Resp 18 | Ht 63.5 in | Wt 249.0 lb

## 2022-04-11 DIAGNOSIS — Z79899 Other long term (current) drug therapy: Secondary | ICD-10-CM

## 2022-04-11 DIAGNOSIS — R739 Hyperglycemia, unspecified: Secondary | ICD-10-CM | POA: Diagnosis not present

## 2022-04-11 DIAGNOSIS — I10 Essential (primary) hypertension: Secondary | ICD-10-CM

## 2022-04-11 DIAGNOSIS — F900 Attention-deficit hyperactivity disorder, predominantly inattentive type: Secondary | ICD-10-CM | POA: Diagnosis not present

## 2022-04-11 DIAGNOSIS — E876 Hypokalemia: Secondary | ICD-10-CM | POA: Diagnosis not present

## 2022-04-11 MED ORDER — LISDEXAMFETAMINE DIMESYLATE 50 MG PO CAPS
50.0000 mg | ORAL_CAPSULE | Freq: Every day | ORAL | 0 refills | Status: DC
  Filled 2022-04-11: qty 30, 30d supply, fill #0

## 2022-04-11 NOTE — Patient Instructions (Addendum)
ADD-  up date contract and uds today. Will send rx vyvanse. I think can get filled Friday or Monday per pharmacist refill rules.  Htn- bp well controlled.  Mood stable with prestique.  Obesity with high sugar. Check A1c today. If diabetid rx ozempic. If not then rx wegovy. Rx advisement given. No contraindications.  Follow up date to be determined after lab review.

## 2022-04-11 NOTE — Progress Notes (Signed)
Subjective:    Patient ID: [name], female    DOB: Aug 12, 1960, 62 y.o.   MRN: [id number]  HPI  Hx of ADD. She is on vyvanse 50 mg daily. Needs  to  update date on contract and UDS.  Htn- bp good level.   Depression- pt mood stable. She is on prestique.   Obese pt and she want to lose weight. No hx of pancreatitis. No family hx of thyromedullary cancer.   3 months ago she had low potassium.   Elevated sugar in past. Pt has no family hx of thyromedulary cancer nad no hx of pancreatitis.     Review of Systems  Constitutional:  Negative for chills, fatigue and fever.  Respiratory:  Negative for cough, chest tightness, shortness of breath and wheezing.   Cardiovascular:  Negative for chest pain and palpitations.  Gastrointestinal:  Negative for abdominal pain and blood in stool.  Genitourinary:  Negative for dysuria and flank pain.  Musculoskeletal:  Negative for back pain and neck stiffness.  Skin:  Negative for rash.  Neurological:  Negative for dizziness, numbness and headaches.  Hematological:  Negative for adenopathy. Does not bruise/bleed easily.  Psychiatric/Behavioral:  Negative for behavioral problems and dysphoric mood.        Stable mood.    Past Medical History:  Diagnosis Date   ADD (attention deficit disorder)    Arthritis    Depression    GERD (gastroesophageal reflux disease)    Hypertension    Psoriasis    Restless leg syndrome      Social History   Socioeconomic History   Marital status: Divorced    Spouse name: Not on file   Number of children: Not on file   Years of education: Not on file   Highest education level: Not on file  Occupational History   Not on file  Tobacco Use   Smoking status: Never   Smokeless tobacco: Never  Vaping Use   Vaping Use: Never used  Substance and Sexual Activity   Alcohol use: Yes    Alcohol/week: 3.0 standard drinks of alcohol    Types: 3 Glasses of wine per week    Comment: social   Drug use: No    Sexual activity: Not on file  Other Topics Concern   Not on file  Social History Narrative   Not on file   Social Determinants of Health   Financial Resource Strain: Not on file  Food Insecurity: Not on file  Transportation Needs: Not on file  Physical Activity: Not on file  Stress: Not on file  Social Connections: Not on file  Intimate Partner Violence: Not on file    Past Surgical History:  Procedure Laterality Date   [place] Left 12/30/2020   Procedure: LEFT TOTAL KNEE ARTHROPLASTY;  Surgeon: Meredith Pel, MD;  Location: [facility];  Service: Orthopedics;  Laterality: Left;    Family History  Problem Relation Age of Onset   Cancer Mother    Mental illness Father    Alcoholism Father    Mental illness Sister    Suicidality Sister    Healthy Son    Healthy Son    Healthy Daughter     No Known Allergies  Current Outpatient Medications on File Prior to Visit  Medication Sig Dispense Refill   amoxicillin (AMOXIL) 500 MG capsule Take 4  capsules by mouth 1 hour prior to dental procedure 10 capsule 0   chlorthalidone (HYGROTON) 25 MG tablet Take 1 tablet (25 mg total) by mouth daily. 30 tablet 5   desvenlafaxine (PRISTIQ) 50 MG 24 hr tablet Take 1 tablet (50 mg total) by mouth daily. 30 tablet 3   gabapentin (NEURONTIN) 600 MG tablet Take 1 tablet (600 mg total) by mouth at bedtime. 90 tablet 1   influenza vac split quadrivalent PF (FLUARIX QUADRIVALENT) 0.5 ML injection Inject into the muscle. 0.5 mL 0   lisdexamfetamine (VYVANSE) 50 MG capsule Take 1 capsule (50 mg total) by mouth daily. 30 capsule 0   methocarbamol (ROBAXIN) 500 MG tablet Take 1-2 tablets (500-1,000 mg total) by mouth every 6 (six) to 8 (eight) hours as needed for spasms/muscle tension 60 tablet 2   olmesartan (BENICAR) 20 MG tablet Take 1 tablet (20 mg total) by mouth daily. 30 tablet 3   omeprazole  (PRILOSEC) 20 MG capsule TAKE 1 CAPSULE (20 MG TOTAL) BY MOUTH DAILY. 30 capsule 6   potassium chloride (KLOR-CON) 10 MEQ tablet TAKE 1 TABLET BY MOUTH ON MONDAY, WEDNESDAY AND FRIDAY 36 tablet 3   Risankizumab-rzaa (SKYRIZI PEN) 150 MG/ML SOAJ Inject one pen on day 0 and then on week 4 then every 12 weeks after 1 mL 5   No current facility-administered medications on file prior to visit.    BP 116/78   Pulse 100   Resp 18   Ht 5' 3.5" (1.613 m)   Wt 249 lb (112.9 kg)   LMP 12/25/2012 (Approximate)   BMI 43.42 kg/m        Objective:   Physical Exam  General Mental Status- Alert. General Appearance- Not in acute distress.   Skin General: Color- Normal Color. Moisture- Normal Moisture.  Neck Carotid Arteries- Normal color. Moisture- Normal Moisture. No carotid bruits. No JVD.  Chest and Lung Exam Auscultation: Breath Sounds:-Normal.  Cardiovascular Auscultation:Rythm- Regular. Murmurs & Other Heart Sounds:Auscultation of the heart reveals- No Murmurs.  Abdomen Inspection:-Inspeection Normal. Palpation/Percussion:Note:No mass. Palpation and Percussion of the abdomen reveal- Non Tender, Non Distended + BS, no rebound or guarding.   Neurologic Cranial Nerve exam:- CN III-XII intact(No nystagmus), symmetric smile. Strength:- 5/5 equal and symmetric strength both upper and lower extremities.       Assessment & Plan:   Patient Instructions  ADD-  up date contract and uds today. Will send rx vyvanse. I think can get filled Friday or Monday per pharmacist refill rules.  Htn- bp well controlled.  Mood stable with prestique.  Obesity with high sugar. Check A1c today. If diabetid rx ozempic. If not then rx wegovy. Rx advisement given. No contraindications.  Follow up date to be determined after lab review.   Mackie Pai, PA-C

## 2022-04-11 NOTE — Addendum Note (Signed)
Addended by: Jeronimo Greaves on: 04/11/2022 03:17 PM   Modules accepted: Orders

## 2022-04-12 ENCOUNTER — Encounter: Payer: Self-pay | Admitting: Medical

## 2022-04-12 LAB — COMPREHENSIVE METABOLIC PANEL
ALT: 42 U/L — ABNORMAL HIGH (ref 0–35)
AST: 27 U/L (ref 0–37)
Albumin: 4.3 g/dL (ref 3.5–5.2)
Alkaline Phosphatase: 86 U/L (ref 39–117)
BUN: 20 mg/dL (ref 6–23)
CO2: 30 mEq/L (ref 19–32)
Calcium: 9.5 mg/dL (ref 8.4–10.5)
Chloride: 99 mEq/L (ref 96–112)
Creatinine, Ser: 0.76 mg/dL (ref 0.40–1.20)
GFR: 85.72 mL/min (ref >60.00)
Glucose, Bld: 100 mg/dL — ABNORMAL HIGH (ref 70–99)
Potassium: 3.5 mEq/L (ref 3.5–5.1)
Sodium: 141 mEq/L (ref 135–145)
Total Bilirubin: 0.4 mg/dL (ref 0.2–1.2)
Total Protein: 7.1 g/dL (ref 6.0–8.3)

## 2022-04-12 LAB — HEMOGLOBIN A1C: Hgb A1c MFr Bld: 6 % (ref 4.6–6.5)

## 2022-04-13 LAB — DRUG MONITORING PANEL 376104, URINE
Amphetamine: 7354 ng/mL — ABNORMAL HIGH (ref <250)
Amphetamines: POSITIVE ng/mL — AB (ref <500)
Barbiturates: NEGATIVE ng/mL (ref <300)
Benzodiazepines: NEGATIVE ng/mL (ref <100)
Cocaine Metabolite: NEGATIVE ng/mL (ref <150)
Desmethyltramadol: NEGATIVE ng/mL (ref <100)
Methamphetamine: NEGATIVE ng/mL (ref <250)
Opiates: NEGATIVE ng/mL (ref <100)
Oxycodone: NEGATIVE ng/mL (ref <100)
Tramadol: NEGATIVE ng/mL (ref <100)

## 2022-04-13 LAB — DM TEMPLATE

## 2022-04-14 ENCOUNTER — Telehealth: Payer: Self-pay

## 2022-04-14 ENCOUNTER — Other Ambulatory Visit (HOSPITAL_BASED_OUTPATIENT_CLINIC_OR_DEPARTMENT_OTHER): Payer: Self-pay

## 2022-04-14 MED ORDER — TIRZEPATIDE 2.5 MG/0.5ML ~~LOC~~ SOAJ
2.5000 mg | SUBCUTANEOUS | 0 refills | Status: DC
  Filled 2022-04-14: qty 2, 28d supply, fill #0

## 2022-04-14 NOTE — Telephone Encounter (Signed)
PA initiated via Covermymeds; KEY: [id number]. Awaiting determination.

## 2022-04-18 ENCOUNTER — Telehealth: Payer: Self-pay

## 2022-04-18 ENCOUNTER — Other Ambulatory Visit (HOSPITAL_BASED_OUTPATIENT_CLINIC_OR_DEPARTMENT_OTHER): Payer: Self-pay

## 2022-04-18 MED ORDER — TIRZEPATIDE-WEIGHT MANAGEMENT 2.5 MG/0.5ML ~~LOC~~ SOAJ
2.5000 mg | SUBCUTANEOUS | 0 refills | Status: DC
  Filled 2022-04-18: qty 2, 28d supply, fill #0

## 2022-04-18 NOTE — Telephone Encounter (Signed)
PA initiated via Covermymeds; KEY: [id number]   A previously denied or partially denied PA request has been located for this member. To initiate an appeal or reconsideration please call [phone]. Thank you

## 2022-04-18 NOTE — Addendum Note (Signed)
Addended by: Anabel Halon on: 04/18/2022 12:09 PM   Modules accepted: Orders

## 2022-04-18 NOTE — Addendum Note (Signed)
Addended by: Anabel Halon on: 04/18/2022 12:06 PM   Modules accepted: Orders

## 2022-04-18 NOTE — Telephone Encounter (Signed)
Rx changed to Zepbound.

## 2022-04-18 NOTE — Telephone Encounter (Signed)
PA denied. Coverage of GLP-1 require dx of type 2 diabetes.

## 2022-04-19 ENCOUNTER — Other Ambulatory Visit (HOSPITAL_BASED_OUTPATIENT_CLINIC_OR_DEPARTMENT_OTHER): Payer: Self-pay

## 2022-04-19 ENCOUNTER — Encounter (HOSPITAL_BASED_OUTPATIENT_CLINIC_OR_DEPARTMENT_OTHER): Payer: Self-pay

## 2022-04-20 ENCOUNTER — Other Ambulatory Visit: Payer: Self-pay | Admitting: Medical

## 2022-04-21 ENCOUNTER — Other Ambulatory Visit (HOSPITAL_BASED_OUTPATIENT_CLINIC_OR_DEPARTMENT_OTHER): Payer: Self-pay

## 2022-04-21 MED ORDER — POTASSIUM CHLORIDE ER 10 MEQ PO TBCR
10.0000 meq | EXTENDED_RELEASE_TABLET | ORAL | 3 refills | Status: DC
  Filled 2022-04-21: qty 36, 84d supply, fill #0
  Filled 2022-07-22: qty 36, 84d supply, fill #1
  Filled 2022-10-17: qty 36, 84d supply, fill #2
  Filled 2023-01-11: qty 36, 84d supply, fill #3

## 2022-04-24 ENCOUNTER — Other Ambulatory Visit (HOSPITAL_BASED_OUTPATIENT_CLINIC_OR_DEPARTMENT_OTHER): Payer: Self-pay

## 2022-04-24 MED ORDER — SEMAGLUTIDE-WEIGHT MANAGEMENT 0.25 MG/0.5ML ~~LOC~~ SOAJ
0.2500 mg | SUBCUTANEOUS | 0 refills | Status: AC
  Filled 2022-04-24: qty 2, 28d supply, fill #0

## 2022-04-24 MED ORDER — SEMAGLUTIDE-WEIGHT MANAGEMENT 0.5 MG/0.5ML ~~LOC~~ SOAJ
0.5000 mg | SUBCUTANEOUS | 0 refills | Status: AC
  Filled 2022-04-24 – 2022-05-25 (×2): qty 2, 28d supply, fill #0

## 2022-04-24 MED ORDER — SEMAGLUTIDE-WEIGHT MANAGEMENT 1.7 MG/0.75ML ~~LOC~~ SOAJ
1.7000 mg | SUBCUTANEOUS | 0 refills | Status: AC
  Filled 2022-04-24 – 2022-07-12 (×3): qty 3, 28d supply, fill #0

## 2022-04-24 MED ORDER — SEMAGLUTIDE-WEIGHT MANAGEMENT 1 MG/0.5ML ~~LOC~~ SOAJ
1.0000 mg | SUBCUTANEOUS | 0 refills | Status: AC
  Filled 2022-04-24 – 2022-06-19 (×5): qty 2, 28d supply, fill #0

## 2022-04-24 MED ORDER — SEMAGLUTIDE-WEIGHT MANAGEMENT 2.4 MG/0.75ML ~~LOC~~ SOAJ
2.4000 mg | SUBCUTANEOUS | 0 refills | Status: AC
  Filled 2022-04-24: qty 3, fill #0
  Filled 2022-08-31: qty 3, 28d supply, fill #0

## 2022-04-24 NOTE — Addendum Note (Signed)
Addended by: Anabel Halon on: 04/24/2022 05:26 PM   Modules accepted: Orders

## 2022-04-25 ENCOUNTER — Telehealth: Payer: Self-pay

## 2022-04-25 ENCOUNTER — Other Ambulatory Visit (HOSPITAL_BASED_OUTPATIENT_CLINIC_OR_DEPARTMENT_OTHER): Payer: Self-pay

## 2022-04-25 NOTE — Telephone Encounter (Signed)
PA approved.   The request has been approved. The authorization is effective from 04/25/2022 to 11/14/2022, as long as the member is enrolled in their current health plan. The request was approved as submitted. This request is approved for a quantity of 1mL per 28 days.Additional authorizations, eligible from 04/25/2022 through 11/14/2022, have been entered for the following:Wegovy 0.5mg /0.9mL allowing 66mL per 28 days; please reference authorization [phone] [id number].Wegovy 1mg /0.64mL allowing 40mL per 28 days; please reference authorization [phone].Wegovy 1.7mg /0.69mL allowing 49mL per 28 day; please reference authorization [id number].Wegovy 2.4mg /0.82mL allowing 63mL per 28 days; please reference authorization [id number]. A written notification letter will follow with additional details

## 2022-04-25 NOTE — Telephone Encounter (Signed)
PA initiated via Covermymeds; KEY: [id number]. Awaiting determination.

## 2022-04-27 ENCOUNTER — Other Ambulatory Visit (HOSPITAL_BASED_OUTPATIENT_CLINIC_OR_DEPARTMENT_OTHER): Payer: Self-pay

## 2022-05-02 ENCOUNTER — Other Ambulatory Visit (HOSPITAL_BASED_OUTPATIENT_CLINIC_OR_DEPARTMENT_OTHER): Payer: Self-pay

## 2022-05-03 ENCOUNTER — Other Ambulatory Visit: Payer: Self-pay

## 2022-05-03 ENCOUNTER — Other Ambulatory Visit: Payer: Self-pay | Admitting: Medical

## 2022-05-04 ENCOUNTER — Other Ambulatory Visit (HOSPITAL_BASED_OUTPATIENT_CLINIC_OR_DEPARTMENT_OTHER): Payer: Self-pay

## 2022-05-04 MED ORDER — OMEPRAZOLE 20 MG PO CPDR
20.0000 mg | DELAYED_RELEASE_CAPSULE | Freq: Every day | ORAL | 6 refills | Status: DC
  Filled 2022-05-04: qty 30, 30d supply, fill #0
  Filled 2022-06-01 – 2022-06-16 (×2): qty 30, 30d supply, fill #1
  Filled 2022-07-12: qty 30, 30d supply, fill #2
  Filled 2022-08-10: qty 30, 30d supply, fill #3
  Filled 2022-09-11: qty 30, 30d supply, fill #4
  Filled 2022-10-17: qty 30, 30d supply, fill #5
  Filled 2022-11-15: qty 30, 30d supply, fill #6

## 2022-05-05 ENCOUNTER — Encounter: Payer: Self-pay | Admitting: Medical

## 2022-05-05 ENCOUNTER — Other Ambulatory Visit (HOSPITAL_BASED_OUTPATIENT_CLINIC_OR_DEPARTMENT_OTHER): Payer: Self-pay

## 2022-05-08 ENCOUNTER — Other Ambulatory Visit (HOSPITAL_BASED_OUTPATIENT_CLINIC_OR_DEPARTMENT_OTHER): Payer: Self-pay

## 2022-05-11 ENCOUNTER — Other Ambulatory Visit (HOSPITAL_COMMUNITY): Payer: Self-pay

## 2022-05-15 ENCOUNTER — Other Ambulatory Visit: Payer: Self-pay | Admitting: Medical

## 2022-05-15 NOTE — Telephone Encounter (Signed)
Requesting: vyvanse Contract:04/11/22 UDS:04/11/22 Last Visit:04/11/22 Next Visit:n/a Last Refill:04/11/22  Please Advise

## 2022-05-16 ENCOUNTER — Other Ambulatory Visit (HOSPITAL_COMMUNITY): Payer: Self-pay

## 2022-05-16 ENCOUNTER — Other Ambulatory Visit (HOSPITAL_BASED_OUTPATIENT_CLINIC_OR_DEPARTMENT_OTHER): Payer: Self-pay

## 2022-05-16 MED ORDER — LISDEXAMFETAMINE DIMESYLATE 50 MG PO CAPS
50.0000 mg | ORAL_CAPSULE | Freq: Every day | ORAL | 0 refills | Status: DC
  Filled 2022-05-16: qty 30, 30d supply, fill #0

## 2022-05-16 NOTE — Telephone Encounter (Signed)
Rx vyvanse sent to pharmacy.

## 2022-05-17 ENCOUNTER — Other Ambulatory Visit: Payer: Self-pay

## 2022-05-24 ENCOUNTER — Encounter: Payer: Self-pay | Admitting: Medical

## 2022-05-24 MED ORDER — LISDEXAMFETAMINE DIMESYLATE 40 MG PO CAPS
40.0000 mg | ORAL_CAPSULE | ORAL | 0 refills | Status: DC
  Filled 2022-05-24: qty 30, 30d supply, fill #0

## 2022-05-24 NOTE — Addendum Note (Signed)
Addended by: Anabel Halon on: 05/24/2022 06:19 PM   Modules accepted: Orders

## 2022-05-25 ENCOUNTER — Other Ambulatory Visit (HOSPITAL_BASED_OUTPATIENT_CLINIC_OR_DEPARTMENT_OTHER): Payer: Self-pay

## 2022-05-30 ENCOUNTER — Other Ambulatory Visit: Payer: Self-pay

## 2022-05-30 ENCOUNTER — Other Ambulatory Visit (HOSPITAL_BASED_OUTPATIENT_CLINIC_OR_DEPARTMENT_OTHER): Payer: Self-pay

## 2022-06-01 ENCOUNTER — Other Ambulatory Visit (HOSPITAL_BASED_OUTPATIENT_CLINIC_OR_DEPARTMENT_OTHER): Payer: Self-pay

## 2022-06-01 ENCOUNTER — Other Ambulatory Visit: Payer: Self-pay

## 2022-06-01 ENCOUNTER — Other Ambulatory Visit: Payer: Self-pay | Admitting: Medical

## 2022-06-01 MED ORDER — OLMESARTAN MEDOXOMIL 20 MG PO TABS
20.0000 mg | ORAL_TABLET | Freq: Every day | ORAL | 3 refills | Status: DC
  Filled 2022-06-01 – 2022-06-16 (×2): qty 30, 30d supply, fill #0
  Filled 2022-07-12: qty 30, 30d supply, fill #1
  Filled 2022-08-10: qty 30, 30d supply, fill #2
  Filled 2022-09-11: qty 30, 30d supply, fill #3

## 2022-06-08 ENCOUNTER — Other Ambulatory Visit (HOSPITAL_BASED_OUTPATIENT_CLINIC_OR_DEPARTMENT_OTHER): Payer: Self-pay

## 2022-06-16 ENCOUNTER — Other Ambulatory Visit (HOSPITAL_BASED_OUTPATIENT_CLINIC_OR_DEPARTMENT_OTHER): Payer: Self-pay

## 2022-06-16 ENCOUNTER — Encounter: Payer: Self-pay | Admitting: Medical

## 2022-06-16 MED ORDER — BOOSTRIX 5-2.5-18.5 LF-MCG/0.5 IM SUSY
PREFILLED_SYRINGE | INTRAMUSCULAR | 0 refills | Status: DC
  Filled 2022-06-16: qty 0.5, 1d supply, fill #0

## 2022-06-18 ENCOUNTER — Other Ambulatory Visit (HOSPITAL_BASED_OUTPATIENT_CLINIC_OR_DEPARTMENT_OTHER): Payer: Self-pay

## 2022-06-18 MED ORDER — LISDEXAMFETAMINE DIMESYLATE 50 MG PO CAPS
50.0000 mg | ORAL_CAPSULE | Freq: Every day | ORAL | 0 refills | Status: DC
  Filled 2022-06-18 – 2022-06-19 (×2): qty 30, 30d supply, fill #0

## 2022-06-18 NOTE — Addendum Note (Signed)
Addended by: Anabel Halon on: 06/18/2022 03:53 PM   Modules accepted: Orders

## 2022-06-19 ENCOUNTER — Other Ambulatory Visit: Payer: Self-pay

## 2022-06-19 ENCOUNTER — Other Ambulatory Visit (HOSPITAL_BASED_OUTPATIENT_CLINIC_OR_DEPARTMENT_OTHER): Payer: Self-pay

## 2022-06-20 ENCOUNTER — Other Ambulatory Visit (HOSPITAL_BASED_OUTPATIENT_CLINIC_OR_DEPARTMENT_OTHER): Payer: Self-pay

## 2022-06-24 ENCOUNTER — Other Ambulatory Visit (HOSPITAL_BASED_OUTPATIENT_CLINIC_OR_DEPARTMENT_OTHER): Payer: Self-pay

## 2022-07-03 ENCOUNTER — Other Ambulatory Visit (HOSPITAL_BASED_OUTPATIENT_CLINIC_OR_DEPARTMENT_OTHER): Payer: Self-pay

## 2022-07-06 ENCOUNTER — Other Ambulatory Visit (HOSPITAL_BASED_OUTPATIENT_CLINIC_OR_DEPARTMENT_OTHER): Payer: Self-pay

## 2022-07-10 ENCOUNTER — Encounter: Payer: Self-pay | Admitting: *Deleted

## 2022-07-11 ENCOUNTER — Other Ambulatory Visit (HOSPITAL_COMMUNITY): Payer: Self-pay

## 2022-07-12 ENCOUNTER — Other Ambulatory Visit (HOSPITAL_BASED_OUTPATIENT_CLINIC_OR_DEPARTMENT_OTHER): Payer: Self-pay

## 2022-07-13 ENCOUNTER — Other Ambulatory Visit (HOSPITAL_BASED_OUTPATIENT_CLINIC_OR_DEPARTMENT_OTHER): Payer: Self-pay

## 2022-07-14 ENCOUNTER — Other Ambulatory Visit (HOSPITAL_BASED_OUTPATIENT_CLINIC_OR_DEPARTMENT_OTHER): Payer: Self-pay

## 2022-07-22 ENCOUNTER — Other Ambulatory Visit: Payer: Self-pay | Admitting: Medical

## 2022-07-24 ENCOUNTER — Other Ambulatory Visit (HOSPITAL_BASED_OUTPATIENT_CLINIC_OR_DEPARTMENT_OTHER): Payer: Self-pay

## 2022-07-24 ENCOUNTER — Other Ambulatory Visit: Payer: Self-pay

## 2022-07-24 MED ORDER — LISDEXAMFETAMINE DIMESYLATE 50 MG PO CAPS
50.0000 mg | ORAL_CAPSULE | Freq: Every day | ORAL | 0 refills | Status: DC
  Filled 2022-07-24: qty 30, 30d supply, fill #0

## 2022-07-24 NOTE — Telephone Encounter (Signed)
Rx refill sent. She need to have controlled med visit in June. Go ahead and get her scheduled

## 2022-07-24 NOTE — Telephone Encounter (Signed)
Requesting: vyvanse Contract:04/11/22 UDS:04/11/22 Last Visit:04/11/22 Next Visit:n/a Last Refill:06/18/22  Please Advise

## 2022-07-25 ENCOUNTER — Other Ambulatory Visit (HOSPITAL_BASED_OUTPATIENT_CLINIC_OR_DEPARTMENT_OTHER): Payer: Self-pay

## 2022-08-07 ENCOUNTER — Other Ambulatory Visit: Payer: Self-pay

## 2022-08-08 ENCOUNTER — Other Ambulatory Visit: Payer: Self-pay

## 2022-08-08 ENCOUNTER — Other Ambulatory Visit (HOSPITAL_COMMUNITY): Payer: Self-pay

## 2022-08-09 ENCOUNTER — Ambulatory Visit: Payer: Commercial Managed Care - PPO | Attending: Medical | Admitting: Pharmacist

## 2022-08-09 DIAGNOSIS — Z79899 Other long term (current) drug therapy: Secondary | ICD-10-CM

## 2022-08-09 NOTE — Progress Notes (Signed)
   S: Patient presents for review of their specialty medication therapy.  Patient is taking Skyrizi for psoriasis. Patient is managed by Dr. Melida Quitter for this.   Adherence: confirmed  Efficacy: has achieved 100% clearance in her scalp. This is the only thing that worked for her and she's pleased with it.  Dosing: Plaque psoriasis, moderate to severe: SubQ: Two consecutive injections (75 mg each) for a total dose of 150 mg at weeks 0, 4, and then every 12 weeks thereafter.  Screening: TB test: completed   Monitoring: S/sx of infection: none S/sx of hypersensitivity/injection site reaction: none   O:     Lab Results  Component Value Date   WBC 9.5 12/26/2021   HGB 14.9 12/26/2021   HCT 43.6 12/26/2021   MCV 92.6 12/26/2021   PLT 303 12/26/2021      Chemistry      Component Value Date/Time   NA 141 04/11/2022 1519   K 3.5 04/11/2022 1519   CL 99 04/11/2022 1519   CO2 30 04/11/2022 1519   BUN 20 04/11/2022 1519   CREATININE 0.76 04/11/2022 1519      Component Value Date/Time   CALCIUM 9.5 04/11/2022 1519   ALKPHOS 86 04/11/2022 1519   AST 27 04/11/2022 1519   ALT 42 (H) 04/11/2022 1519   BILITOT 0.4 04/11/2022 1519       A/P: 1. Medication review: Patient is taking Skyrizi for psoriasis. Reviewed the medication with the patient, including the following: Cristy Folks is a monoclonal antibody used in the treatment of psoriasis. Patient educated on purpose, proper use and potential adverse effects of Skyrizi. Possible adverse effects are infections, headache, and injection site reactions. Live vaccinations should be avoided while on therapy. SubQ: Administer the two consecutive injections subcutaneously at different anatomic locations, such as thighs, abdomen, or back of upper arms. Do not inject into areas where the skin is tender, bruised, red, hard, or affected by psoriasis. Intended for use under supervision of a health care professional; self-injection may occur after  proper training (except back of upper arms). No recommendations for any changes at this time.  Butch Penny, PharmD, P[place]y [place], CPP Clinical Pharmacist [facility] [phone]

## 2022-08-10 ENCOUNTER — Other Ambulatory Visit: Payer: Self-pay

## 2022-08-10 ENCOUNTER — Other Ambulatory Visit: Payer: Self-pay | Admitting: Medical

## 2022-08-10 ENCOUNTER — Other Ambulatory Visit (HOSPITAL_BASED_OUTPATIENT_CLINIC_OR_DEPARTMENT_OTHER): Payer: Self-pay

## 2022-08-10 MED ORDER — CHLORTHALIDONE 25 MG PO TABS
25.0000 mg | ORAL_TABLET | Freq: Every day | ORAL | 5 refills | Status: DC
  Filled 2022-08-10: qty 30, 30d supply, fill #0
  Filled 2022-09-11: qty 30, 30d supply, fill #1
  Filled 2022-10-17: qty 30, 30d supply, fill #2
  Filled 2022-11-15: qty 30, 30d supply, fill #3
  Filled 2022-12-18: qty 30, 30d supply, fill #4
  Filled 2023-01-24: qty 30, 30d supply, fill #5

## 2022-08-10 MED ORDER — DESVENLAFAXINE SUCCINATE ER 50 MG PO TB24
50.0000 mg | ORAL_TABLET | Freq: Every day | ORAL | 3 refills | Status: DC
  Filled 2022-08-10: qty 30, 30d supply, fill #0
  Filled 2022-09-11: qty 30, 30d supply, fill #1
  Filled 2022-10-17: qty 30, 30d supply, fill #2
  Filled 2022-11-15: qty 30, 30d supply, fill #3

## 2022-08-11 ENCOUNTER — Other Ambulatory Visit (HOSPITAL_COMMUNITY): Payer: Self-pay

## 2022-08-16 ENCOUNTER — Other Ambulatory Visit: Payer: Self-pay

## 2022-08-16 ENCOUNTER — Ambulatory Visit: Payer: Commercial Managed Care - PPO | Admitting: Podiatry

## 2022-08-24 IMAGING — MG DIGITAL SCREENING BILAT W/ TOMO W/ CAD
8 series · 8 of 24 positions shown · non-contrast
Comparison: Previous exam(s).

CLINICAL DATA: Screening.

EXAM:
DIGITAL SCREENING BILATERAL MAMMOGRAM WITH TOMO AND CAD

[R CC synth-2D]
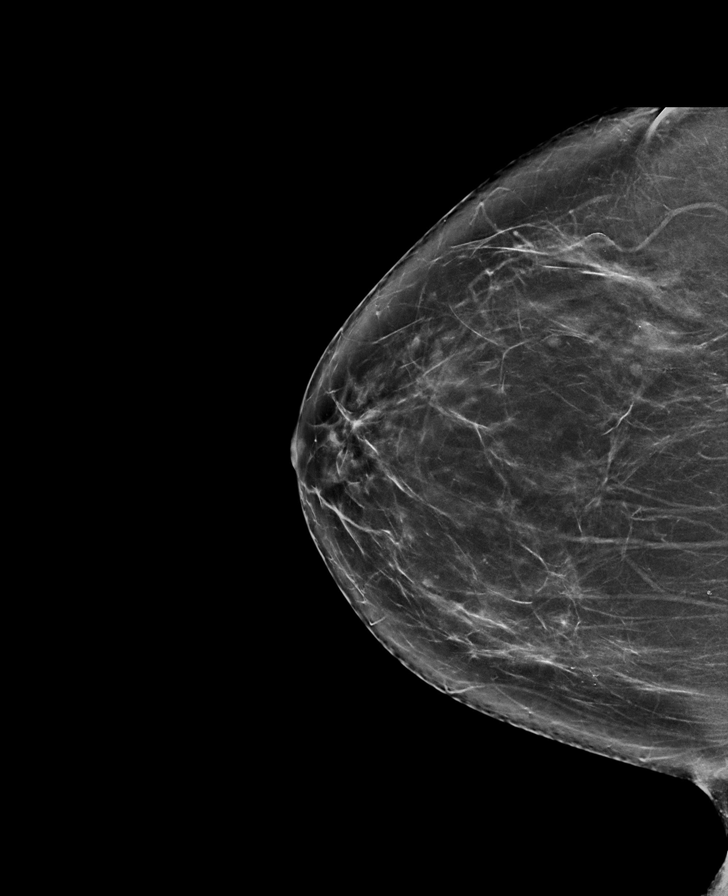

[L CC synth-2D]
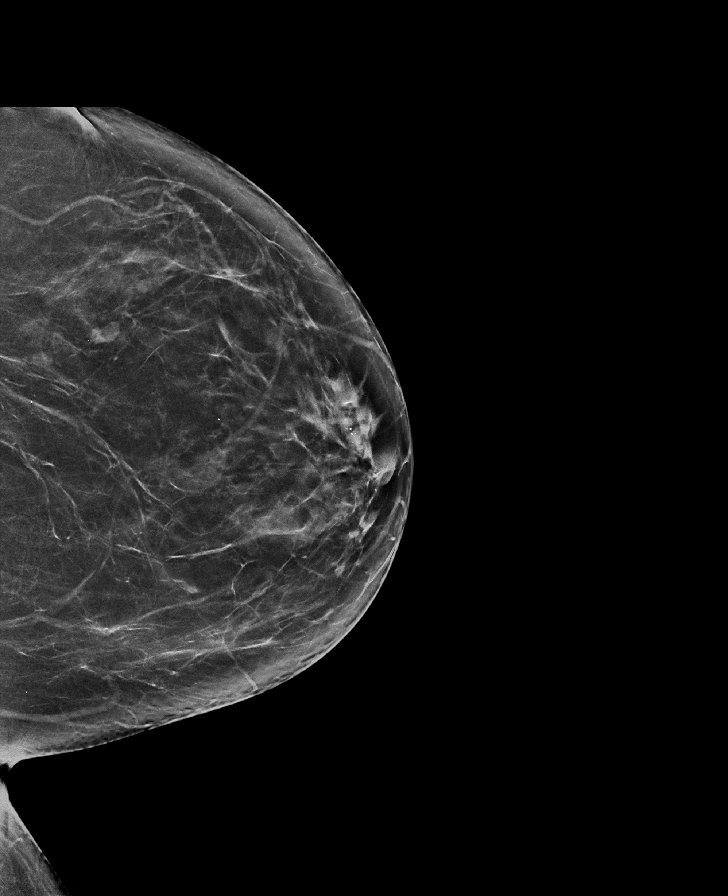

[L MLO synth-2D]
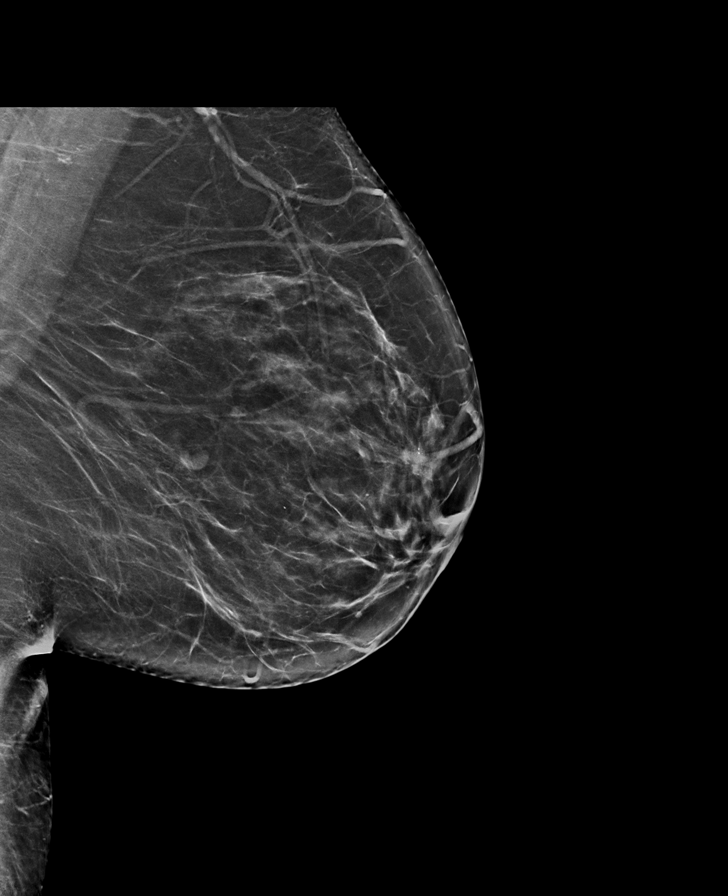

[R MLO synth-2D]
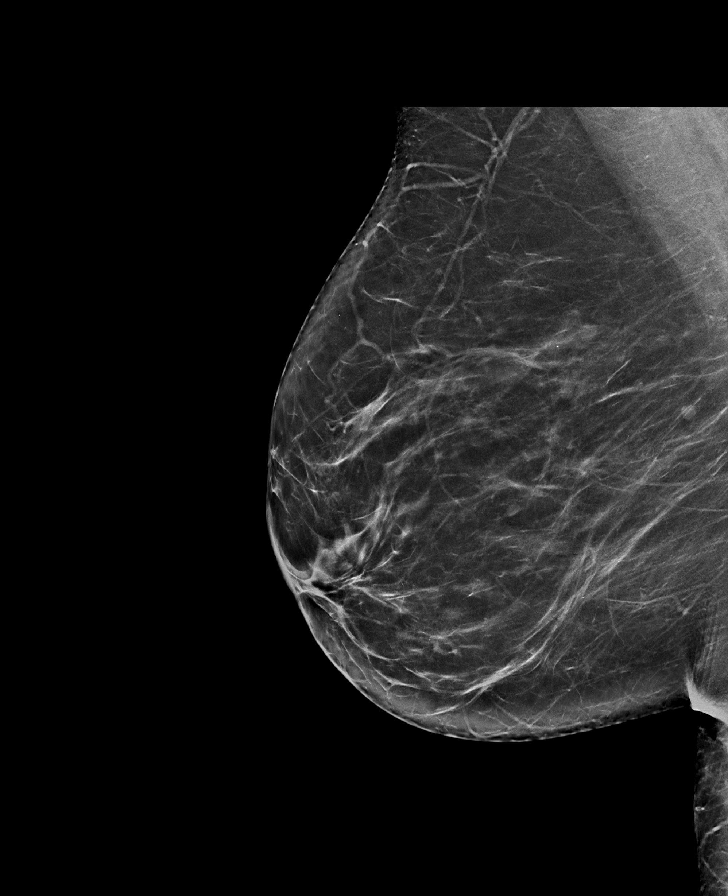

[R CC tomo · tomo slice 39/76.0]
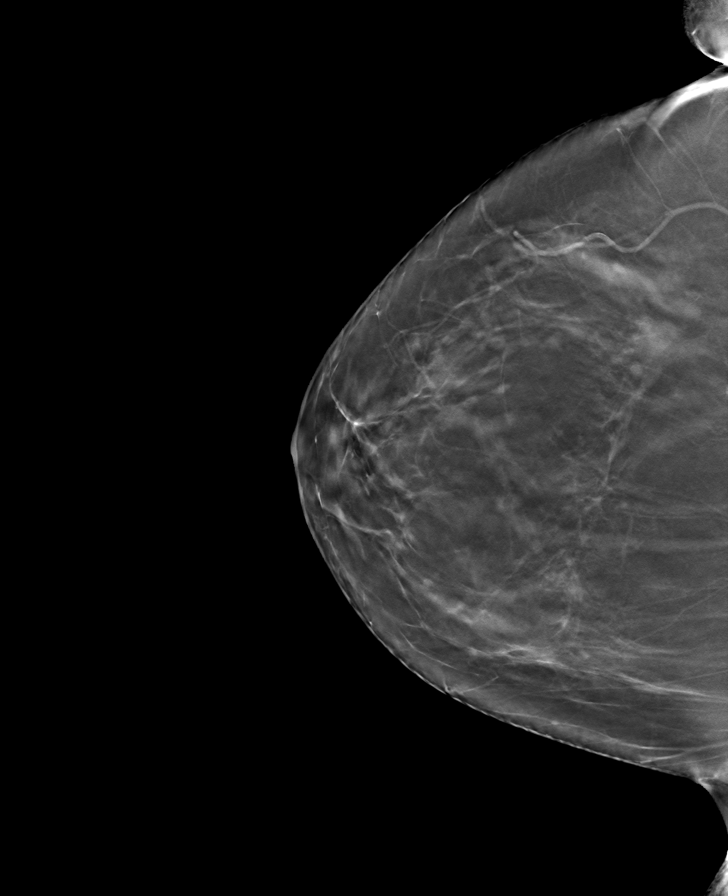

[R MLO tomo · tomo slice 39/78.0]
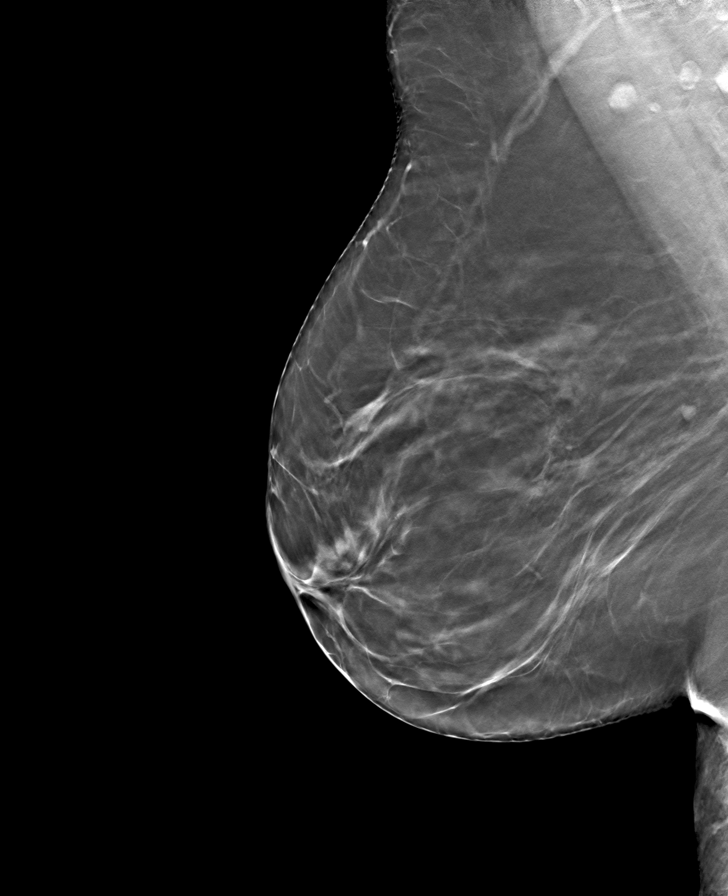

[L MLO tomo · tomo slice 39/76.0]
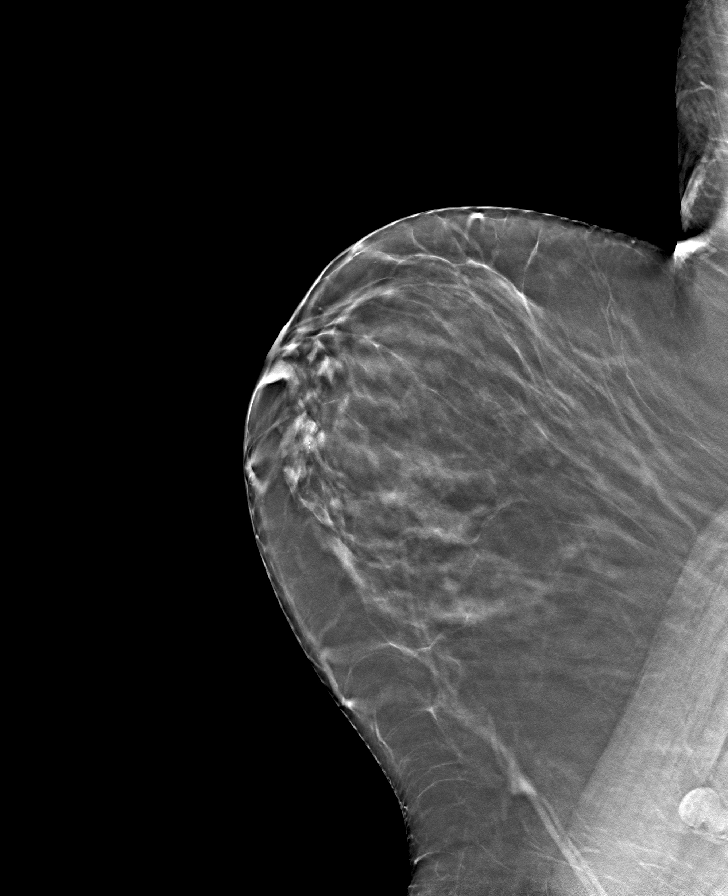

[L CC tomo · tomo slice 37/73.0]
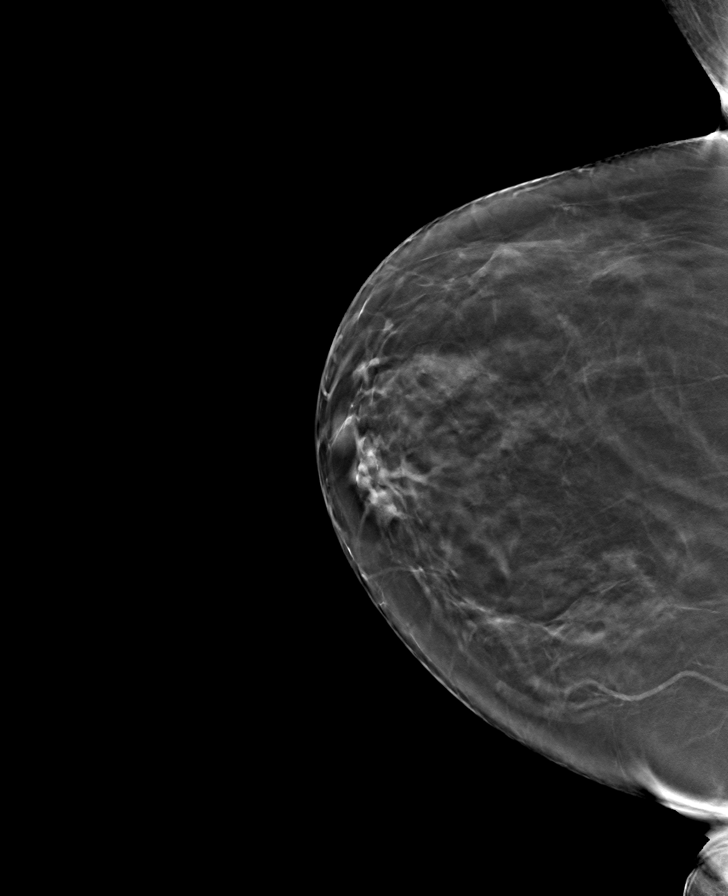

[8 of 24 positions shown; findings below may reference images not displayed]

ACR Breast Density Category b: There are scattered areas of
fibroglandular density.
FINDINGS: There are no findings suspicious for malignancy. Images were
processed with CAD.
IMPRESSION: No mammographic evidence of malignancy. A result letter of this
screening mammogram will be mailed directly to the patient.

RECOMMENDATION:
Screening mammogram in one year. (Code:CN-U-775)

BI-RADS CATEGORY  1: Negative.

## 2022-08-31 ENCOUNTER — Other Ambulatory Visit (HOSPITAL_BASED_OUTPATIENT_CLINIC_OR_DEPARTMENT_OTHER): Payer: Self-pay

## 2022-09-11 ENCOUNTER — Other Ambulatory Visit (HOSPITAL_BASED_OUTPATIENT_CLINIC_OR_DEPARTMENT_OTHER): Payer: Self-pay

## 2022-09-11 ENCOUNTER — Other Ambulatory Visit: Payer: Self-pay | Admitting: Medical

## 2022-09-11 NOTE — Telephone Encounter (Signed)
Requesting: vyvanse Contract:04/11/22 UDS:04/11/22 Last Visit:04/11/22 Next Visit:n/a Last Refill:07/24/22  Please Advise

## 2022-09-12 NOTE — Telephone Encounter (Signed)
Sent pt mychart message

## 2022-09-14 ENCOUNTER — Other Ambulatory Visit (HOSPITAL_BASED_OUTPATIENT_CLINIC_OR_DEPARTMENT_OTHER): Payer: Self-pay

## 2022-09-15 ENCOUNTER — Ambulatory Visit: Payer: Commercial Managed Care - PPO | Admitting: Medical

## 2022-09-29 ENCOUNTER — Other Ambulatory Visit: Payer: Self-pay | Admitting: Medical

## 2022-10-02 ENCOUNTER — Other Ambulatory Visit (HOSPITAL_BASED_OUTPATIENT_CLINIC_OR_DEPARTMENT_OTHER): Payer: Self-pay

## 2022-10-02 MED ORDER — GABAPENTIN 600 MG PO TABS
600.0000 mg | ORAL_TABLET | Freq: Every day | ORAL | 1 refills | Status: DC
  Filled 2022-10-02: qty 90, 90d supply, fill #0
  Filled 2023-01-02: qty 90, 90d supply, fill #1

## 2022-10-11 ENCOUNTER — Ambulatory Visit: Payer: Commercial Managed Care - PPO | Admitting: Medical

## 2022-10-17 ENCOUNTER — Other Ambulatory Visit: Payer: Self-pay | Admitting: Medical

## 2022-10-18 ENCOUNTER — Ambulatory Visit: Payer: Commercial Managed Care - PPO | Admitting: Medical

## 2022-10-18 ENCOUNTER — Other Ambulatory Visit (HOSPITAL_BASED_OUTPATIENT_CLINIC_OR_DEPARTMENT_OTHER): Payer: Self-pay

## 2022-10-18 ENCOUNTER — Encounter: Payer: Self-pay | Admitting: Medical

## 2022-10-18 VITALS — BP 121/70 | HR 99 | Ht 63.5 in | Wt 249.6 lb

## 2022-10-18 DIAGNOSIS — Z6841 Body Mass Index (BMI) 40.0 and over, adult: Secondary | ICD-10-CM

## 2022-10-18 DIAGNOSIS — M79642 Pain in left hand: Secondary | ICD-10-CM | POA: Diagnosis not present

## 2022-10-18 DIAGNOSIS — R739 Hyperglycemia, unspecified: Secondary | ICD-10-CM | POA: Diagnosis not present

## 2022-10-18 DIAGNOSIS — F900 Attention-deficit hyperactivity disorder, predominantly inattentive type: Secondary | ICD-10-CM

## 2022-10-18 DIAGNOSIS — I10 Essential (primary) hypertension: Secondary | ICD-10-CM | POA: Diagnosis not present

## 2022-10-18 DIAGNOSIS — E669 Obesity, unspecified: Secondary | ICD-10-CM | POA: Diagnosis not present

## 2022-10-18 LAB — COMPREHENSIVE METABOLIC PANEL
ALT: 28 U/L (ref 0–35)
AST: 23 U/L (ref 0–37)
Albumin: 4.3 g/dL (ref 3.5–5.2)
Alkaline Phosphatase: 96 U/L (ref 39–117)
BUN: 21 mg/dL (ref 6–23)
CO2: 28 mEq/L (ref 19–32)
Calcium: 9.6 mg/dL (ref 8.4–10.5)
Chloride: 99 mEq/L (ref 96–112)
Creatinine, Ser: 0.82 mg/dL (ref 0.40–1.20)
GFR: 77.97 mL/min (ref >60.00)
Glucose, Bld: 106 mg/dL — ABNORMAL HIGH (ref 70–99)
Potassium: 3.8 mEq/L (ref 3.5–5.1)
Sodium: 140 mEq/L (ref 135–145)
Total Bilirubin: 0.5 mg/dL (ref 0.2–1.2)
Total Protein: 7 g/dL (ref 6.0–8.3)

## 2022-10-18 LAB — HEMOGLOBIN A1C: Hgb A1c MFr Bld: 5.8 % (ref 4.6–6.5)

## 2022-10-18 MED ORDER — LISDEXAMFETAMINE DIMESYLATE 50 MG PO CAPS
50.0000 mg | ORAL_CAPSULE | Freq: Every day | ORAL | 0 refills | Status: DC
  Filled 2022-10-18: qty 30, 30d supply, fill #0

## 2022-10-18 MED ORDER — MELOXICAM 7.5 MG PO TABS
7.5000 mg | ORAL_TABLET | Freq: Every day | ORAL | 1 refills | Status: DC
  Filled 2022-10-18: qty 30, 15d supply, fill #0
  Filled 2022-11-01: qty 30, 15d supply, fill #1

## 2022-10-18 MED ORDER — OLMESARTAN MEDOXOMIL 20 MG PO TABS
20.0000 mg | ORAL_TABLET | Freq: Every day | ORAL | 3 refills | Status: DC
  Filled 2022-10-18: qty 30, 30d supply, fill #0
  Filled 2022-11-15: qty 30, 30d supply, fill #1
  Filled 2022-12-18: qty 30, 30d supply, fill #2
  Filled 2023-01-24: qty 30, 30d supply, fill #3

## 2022-10-18 NOTE — Telephone Encounter (Signed)
Appt later today.

## 2022-10-18 NOTE — Patient Instructions (Signed)
1. Pain of left hand Rx meloxicam for pain. Rx advisement. - Ambulatory referral to Hand Surgery  2. Elevated blood sugar Low sugar diet. - Comp Met (CMET) - Hemoglobin A1c  3. Hypertension, unspecified type Continue benicar and chlorthalidone.   - Comp Met (CMET)  4. Attention deficit hyperactivity disorder (ADHD), predominantly inattentive type Refill your Vyvanse  today. Up to date on contract and uds.  5. Obesity (BMI 30-39.9) Keep trying to lose weight. As you mentioned surgery option.  Follow up January or sooner if needed.

## 2022-10-18 NOTE — Progress Notes (Signed)
Subjective:    Patient ID: [name], female    DOB: 18-Oct-1960, 62 y.o.   MRN: [id number]  HPI  Hx of ADD. She is on vyvanse 50 mg daily. Up to date date on contract and UDS.   Htn- bp good level. On benicar and chlorthalidone.   Depression- pt mood stable. She is on prestique.    Obese pt and she want to lose weight. No longer on wegovy since as too expensive.  Pt has bilateral hand pain. Worse in left thumb. Poor grip strength. She is rt handed. She wants meloxicam for pain.   Review of Systems  Constitutional:  Negative for chills, fatigue and fever.  HENT:  Negative for dental problem, ear pain and facial swelling.   Respiratory:  Negative for cough, chest tightness, shortness of breath and wheezing.   Cardiovascular:  Negative for chest pain and palpitations.  Gastrointestinal:  Negative for abdominal pain.  Genitourinary:  Negative for dysuria and frequency.  Musculoskeletal:  Negative for back pain and joint swelling.       Left  hand pain  Neurological:  Negative for dizziness and headaches.  Hematological:  Negative for adenopathy. Does not bruise/bleed easily.     Past Medical History:  Diagnosis Date   ADD (attention deficit disorder)    Arthritis    Depression    GERD (gastroesophageal reflux disease)    Hypertension    Psoriasis    Restless leg syndrome      Social History   Socioeconomic History   Marital status: Divorced    Spouse name: Not on file   Number of children: Not on file   Years of education: Not on file   Highest education level: Not on file  Occupational History   Not on file  Tobacco Use   Smoking status: Never   Smokeless tobacco: Never  Vaping Use   Vaping status: Never Used  Substance and Sexual Activity   Alcohol use: Yes    Alcohol/week: 3.0 standard drinks of alcohol    Types: 3 Glasses of wine per week    Comment: social   Drug use: No   Sexual activity: Not on file  Other Topics Concern   Not on file   Social History Narrative   Not on file   Social Determinants of Health   Financial Resource Strain: Not on file  Food Insecurity: Not on file  Transportation Needs: Not on file  Physical Activity: Not on file  Stress: Not on file  Social Connections: Not on file  Intimate Partner Violence: Not on file    Past Surgical History:  Procedure Laterality Date   CESAREAN SECTION     DILATION AND CURETTAGE OF UTERUS  1998   TONSILLECTOMY     TOTAL KNEE ARTHROPLASTY Left 12/30/2020   Procedure: LEFT TOTAL KNEE ARTHROPLASTY;  Surgeon: Cammy Copa, MD;  Location: [facility];  Service: Orthopedics;  Laterality: Left;    Family History  Problem Relation Age of Onset   Cancer Mother    Mental illness Father    Alcoholism Father    Mental illness Sister    Suicidality Sister    Healthy Son    Healthy Son    Healthy Daughter     No Known Allergies  Current Outpatient Medications on File Prior to Visit  Medication Sig Dispense Refill   chlorthalidone (HYGROTON) 25 MG tablet Take 1 tablet (25 mg total) by mouth daily. 30 tablet 5   desvenlafaxine (  PRISTIQ) 50 MG 24 hr tablet Take 1 tablet (50 mg total) by mouth daily. 30 tablet 3   gabapentin (NEURONTIN) 600 MG tablet Take 1 tablet (600 mg total) by mouth at bedtime. 90 tablet 1   influenza vac split quadrivalent PF (FLUARIX QUADRIVALENT) 0.5 ML injection Inject into the muscle. 0.5 mL 0   lisdexamfetamine (VYVANSE) 50 MG capsule Take 1 capsule (50 mg total) by mouth daily. 30 capsule 0   methocarbamol (ROBAXIN) 500 MG tablet Take 1-2 tablets (500-1,000 mg total) by mouth every 6 (six) to 8 (eight) hours as needed for spasms/muscle tension 60 tablet 2   omeprazole (PRILOSEC) 20 MG capsule Take 1 capsule (20 mg total) by mouth daily. 30 capsule 6   potassium chloride (KLOR-CON) 10 MEQ tablet Take 1 tablet (10 mEq total) by mouth every Monday, Wednesday, and Friday. 36 tablet 3   Risankizumab-rzaa (SKYRIZI PEN) 150 MG/ML SOAJ Inject  one pen on day 0 and then on week 4 then every 12 weeks after 1 mL 5   Tdap (BOOSTRIX) 5-2.5-18.5 LF-MCG/0.5 injection Inject into the muscle. 0.5 mL 0   amoxicillin (AMOXIL) 500 MG capsule Take 4 capsules by mouth 1 hour prior to dental procedure (Patient not taking: Reported on 10/18/2022) 10 capsule 0   olmesartan (BENICAR) 20 MG tablet Take 1 tablet (20 mg total) by mouth daily. 30 tablet 3   No current facility-administered medications on file prior to visit.    BP 121/70 (BP Location: Left Arm, Patient Position: Sitting, Cuff Size: Large)   Pulse 99   Ht 5' 3.5" (1.613 m)   Wt 249 lb 9.6 oz (113.2 kg)   LMP 12/25/2012 (Approximate)   SpO2 99%   BMI 43.52 kg/m        Objective:   Physical Exam  General Mental Status- Alert. General Appearance- Not in acute distress.   Skin General: Color- Normal Color. Moisture- Normal Moisture.  Neck Carotid Arteries- Normal color. Moisture- Normal Moisture. No carotid bruits. No JVD.  Chest and Lung Exam Auscultation: Breath Sounds:-Normal.  Cardiovascular Auscultation:Rythm- Regular. Murmurs & Other Heart Sounds:Auscultation of the heart reveals- No Murmurs.  Abdomen Inspection:-Inspeection Normal. Palpation/Percussion:Note:No mass. Palpation and Percussion of the abdomen reveal- Non Tender, Non Distended + BS, no rebound or guarding.   Neurologic Cranial Nerve exam:- CN III-XII intact(No nystagmus), symmetric smile. Strength:- 5/5 equal and symmetric strength both upper and lower extremities.   Left hand- prominent thenar imminence. Can't close hand fully.    Assessment & Plan:   Patient Instructions  1. Pain of left hand Rx meloxicam for pain. Rx advisement. - Ambulatory referral to Hand Surgery  2. Elevated blood sugar Low sugar diet. - Comp Met (CMET) - Hemoglobin A1c  3. Hypertension, unspecified type Continue benicar and chlorthalidone.   - Comp Met (CMET)  4. Attention deficit hyperactivity disorder  (ADHD), predominantly inattentive type Refill your Vyvanse  today. Up to date on contract and uds.  5. Obesity (BMI 30-39.9) Keep trying to lose weight. As you mentioned surgery option.  Follow up January or sooner if needed.    Esperanza Richters, PA-C

## 2022-10-20 ENCOUNTER — Other Ambulatory Visit (HOSPITAL_BASED_OUTPATIENT_CLINIC_OR_DEPARTMENT_OTHER): Payer: Self-pay

## 2022-10-23 ENCOUNTER — Other Ambulatory Visit: Payer: Self-pay | Admitting: Internal Medicine

## 2022-10-24 ENCOUNTER — Other Ambulatory Visit (HOSPITAL_COMMUNITY): Payer: Self-pay

## 2022-10-24 NOTE — Telephone Encounter (Signed)
Requested Prescriptions  Pending Prescriptions Disp Refills   risankizumab-rzaa (SKYRIZI PEN) 150 MG/ML pen 1 mL 5    Sig: Inject one pen on day 0 and then on week 4 then every 12 weeks after     Off-Protocol Failed - 10/23/2022  4:09 PM      Failed - Medication not assigned to a protocol, review manually.      Passed - Valid encounter within last 12 months    Recent Outpatient Visits           2 months ago Encounter for medication review   [facility] [place], Cornelius Moras, RPH-CPP   1 year ago Encounter for medication review   [facility] Drucilla Chalet, RPH-CPP   2 years ago Encounter for medication review   [facility] Drucilla Chalet, RPH-CPP

## 2022-10-26 ENCOUNTER — Other Ambulatory Visit (HOSPITAL_COMMUNITY): Payer: Self-pay

## 2022-10-26 ENCOUNTER — Ambulatory Visit: Payer: Commercial Managed Care - PPO | Admitting: Orthopaedic Surgery

## 2022-10-27 ENCOUNTER — Other Ambulatory Visit: Payer: Self-pay

## 2022-10-30 ENCOUNTER — Other Ambulatory Visit: Payer: Self-pay | Admitting: Internal Medicine

## 2022-10-31 ENCOUNTER — Other Ambulatory Visit (HOSPITAL_COMMUNITY): Payer: Self-pay

## 2022-11-01 ENCOUNTER — Encounter (HOSPITAL_COMMUNITY): Payer: Self-pay

## 2022-11-01 ENCOUNTER — Other Ambulatory Visit (HOSPITAL_COMMUNITY): Payer: Self-pay

## 2022-11-01 ENCOUNTER — Other Ambulatory Visit (HOSPITAL_BASED_OUTPATIENT_CLINIC_OR_DEPARTMENT_OTHER): Payer: Self-pay

## 2022-11-09 ENCOUNTER — Other Ambulatory Visit (HOSPITAL_COMMUNITY): Payer: Self-pay

## 2022-11-12 ENCOUNTER — Other Ambulatory Visit: Payer: Self-pay | Admitting: Medical

## 2022-11-13 ENCOUNTER — Other Ambulatory Visit (HOSPITAL_BASED_OUTPATIENT_CLINIC_OR_DEPARTMENT_OTHER): Payer: Self-pay

## 2022-11-13 MED ORDER — MELOXICAM 7.5 MG PO TABS
7.5000 mg | ORAL_TABLET | Freq: Every day | ORAL | 1 refills | Status: DC
  Filled 2022-11-13: qty 30, 15d supply, fill #0
  Filled 2022-12-01 – 2022-12-18 (×2): qty 30, 15d supply, fill #1

## 2022-11-14 ENCOUNTER — Ambulatory Visit: Payer: Commercial Managed Care - PPO | Admitting: Orthopaedic Surgery

## 2022-11-15 ENCOUNTER — Other Ambulatory Visit (HOSPITAL_BASED_OUTPATIENT_CLINIC_OR_DEPARTMENT_OTHER): Payer: Self-pay

## 2022-11-16 ENCOUNTER — Other Ambulatory Visit (HOSPITAL_BASED_OUTPATIENT_CLINIC_OR_DEPARTMENT_OTHER): Payer: Self-pay

## 2022-11-16 ENCOUNTER — Other Ambulatory Visit: Payer: Self-pay | Admitting: Medical

## 2022-11-16 MED ORDER — LISDEXAMFETAMINE DIMESYLATE 50 MG PO CAPS
50.0000 mg | ORAL_CAPSULE | Freq: Every day | ORAL | 0 refills | Status: DC
  Filled 2022-11-16 – 2022-11-17 (×3): qty 30, 30d supply, fill #0

## 2022-11-16 NOTE — Telephone Encounter (Signed)
Rx refill sent to pt pharmacy 

## 2022-11-16 NOTE — Telephone Encounter (Signed)
Requesting: Vyvanse 50mg   Contract: 04/11/22 UDS: 04/11/22 Last Visit: 10/18/22 Next Visit: None Last Refill: 10/18/22 #30 and 0RF   Please Advise

## 2022-11-17 ENCOUNTER — Other Ambulatory Visit: Payer: Self-pay

## 2022-11-17 ENCOUNTER — Other Ambulatory Visit (HOSPITAL_BASED_OUTPATIENT_CLINIC_OR_DEPARTMENT_OTHER): Payer: Self-pay

## 2022-12-01 ENCOUNTER — Other Ambulatory Visit: Payer: Self-pay

## 2022-12-01 ENCOUNTER — Other Ambulatory Visit (HOSPITAL_BASED_OUTPATIENT_CLINIC_OR_DEPARTMENT_OTHER): Payer: Self-pay

## 2022-12-05 ENCOUNTER — Ambulatory Visit: Payer: Commercial Managed Care - PPO | Admitting: Orthopaedic Surgery

## 2022-12-07 ENCOUNTER — Other Ambulatory Visit (HOSPITAL_COMMUNITY): Payer: Self-pay

## 2022-12-12 ENCOUNTER — Other Ambulatory Visit (HOSPITAL_BASED_OUTPATIENT_CLINIC_OR_DEPARTMENT_OTHER): Payer: Self-pay

## 2022-12-14 ENCOUNTER — Other Ambulatory Visit: Payer: Self-pay

## 2022-12-16 ENCOUNTER — Encounter (HOSPITAL_COMMUNITY): Payer: Self-pay

## 2022-12-18 ENCOUNTER — Other Ambulatory Visit: Payer: Self-pay | Admitting: Medical

## 2022-12-18 ENCOUNTER — Other Ambulatory Visit (HOSPITAL_BASED_OUTPATIENT_CLINIC_OR_DEPARTMENT_OTHER): Payer: Self-pay

## 2022-12-18 MED ORDER — LISDEXAMFETAMINE DIMESYLATE 50 MG PO CAPS
50.0000 mg | ORAL_CAPSULE | Freq: Every day | ORAL | 0 refills | Status: DC
Start: 2022-12-18 — End: 2023-01-24
  Filled 2022-12-18: qty 30, 30d supply, fill #0

## 2022-12-18 MED ORDER — DESVENLAFAXINE SUCCINATE ER 50 MG PO TB24
50.0000 mg | ORAL_TABLET | Freq: Every day | ORAL | 3 refills | Status: DC
  Filled 2022-12-18: qty 30, 30d supply, fill #0
  Filled 2023-01-24: qty 30, 30d supply, fill #1
  Filled 2023-02-27: qty 30, 30d supply, fill #2
  Filled 2023-03-28: qty 30, 30d supply, fill #3

## 2022-12-18 MED ORDER — OMEPRAZOLE 20 MG PO CPDR
20.0000 mg | DELAYED_RELEASE_CAPSULE | Freq: Every day | ORAL | 6 refills | Status: DC
  Filled 2022-12-18: qty 30, 30d supply, fill #0
  Filled 2023-01-24: qty 30, 30d supply, fill #1
  Filled 2023-02-27: qty 30, 30d supply, fill #2
  Filled 2023-03-28: qty 30, 30d supply, fill #3

## 2022-12-18 NOTE — Telephone Encounter (Signed)
Rx vyvanse sent to pharmacy.

## 2022-12-18 NOTE — Telephone Encounter (Signed)
Requesting: Vyvanse 50mg   Contract: 04/11/22 UDS: 04/11/22 Last Visit: 10/18/22 Next Visit: None Last Refill: 11/16/22 #30 and 0RF   Please Advise

## 2022-12-19 ENCOUNTER — Other Ambulatory Visit (HOSPITAL_BASED_OUTPATIENT_CLINIC_OR_DEPARTMENT_OTHER): Payer: Self-pay

## 2022-12-19 ENCOUNTER — Other Ambulatory Visit: Payer: Self-pay

## 2022-12-22 ENCOUNTER — Other Ambulatory Visit (HOSPITAL_COMMUNITY): Payer: Self-pay

## 2022-12-22 ENCOUNTER — Other Ambulatory Visit (HOSPITAL_BASED_OUTPATIENT_CLINIC_OR_DEPARTMENT_OTHER): Payer: Self-pay

## 2023-01-02 ENCOUNTER — Other Ambulatory Visit (HOSPITAL_BASED_OUTPATIENT_CLINIC_OR_DEPARTMENT_OTHER): Payer: Self-pay

## 2023-01-09 ENCOUNTER — Other Ambulatory Visit: Payer: Self-pay | Admitting: Medical

## 2023-01-10 DIAGNOSIS — Z1231 Encounter for screening mammogram for malignant neoplasm of breast: Secondary | ICD-10-CM

## 2023-01-11 ENCOUNTER — Other Ambulatory Visit (HOSPITAL_BASED_OUTPATIENT_CLINIC_OR_DEPARTMENT_OTHER): Payer: Self-pay

## 2023-01-11 ENCOUNTER — Other Ambulatory Visit: Payer: Self-pay

## 2023-01-11 MED ORDER — MELOXICAM 7.5 MG PO TABS
7.5000 mg | ORAL_TABLET | Freq: Every day | ORAL | 1 refills | Status: DC
  Filled 2023-01-11: qty 30, 15d supply, fill #0
  Filled 2023-01-24: qty 30, 15d supply, fill #1

## 2023-01-11 NOTE — Telephone Encounter (Signed)
Rx refill sent to pharmacy.

## 2023-01-24 ENCOUNTER — Other Ambulatory Visit: Payer: Self-pay

## 2023-01-24 ENCOUNTER — Other Ambulatory Visit (HOSPITAL_BASED_OUTPATIENT_CLINIC_OR_DEPARTMENT_OTHER): Payer: Self-pay

## 2023-01-24 ENCOUNTER — Other Ambulatory Visit: Payer: Self-pay | Admitting: Medical

## 2023-01-24 MED ORDER — LISDEXAMFETAMINE DIMESYLATE 50 MG PO CAPS
50.0000 mg | ORAL_CAPSULE | Freq: Every day | ORAL | 0 refills | Status: DC
  Filled 2023-01-24: qty 30, 30d supply, fill #0

## 2023-01-24 NOTE — Telephone Encounter (Signed)
Refilled med. Early January will be due for 6 month controlled med visit and needs updated contract and uds at that time.

## 2023-01-24 NOTE — Telephone Encounter (Signed)
Requesting: vyvanse Contract:04/18/22 UDS:04/18/22 Last Visit:10/18/22 Next Visit:n/a Last Refill:12/18/22  Please Advise

## 2023-01-30 ENCOUNTER — Other Ambulatory Visit (HOSPITAL_BASED_OUTPATIENT_CLINIC_OR_DEPARTMENT_OTHER): Payer: Self-pay

## 2023-02-19 ENCOUNTER — Other Ambulatory Visit: Payer: Self-pay | Admitting: Medical

## 2023-02-19 MED ORDER — MELOXICAM 7.5 MG PO TABS
7.5000 mg | ORAL_TABLET | Freq: Every day | ORAL | 1 refills | Status: DC
  Filled 2023-02-19: qty 30, 15d supply, fill #0
  Filled 2023-03-28: qty 30, 15d supply, fill #1

## 2023-02-19 NOTE — Telephone Encounter (Signed)
Rx refill meloxicam sent.

## 2023-02-20 ENCOUNTER — Other Ambulatory Visit (HOSPITAL_BASED_OUTPATIENT_CLINIC_OR_DEPARTMENT_OTHER): Payer: Self-pay

## 2023-02-27 ENCOUNTER — Other Ambulatory Visit: Payer: Self-pay | Admitting: Medical

## 2023-02-27 ENCOUNTER — Other Ambulatory Visit (HOSPITAL_BASED_OUTPATIENT_CLINIC_OR_DEPARTMENT_OTHER): Payer: Self-pay

## 2023-02-27 ENCOUNTER — Other Ambulatory Visit: Payer: Self-pay

## 2023-02-27 MED ORDER — CHLORTHALIDONE 25 MG PO TABS
25.0000 mg | ORAL_TABLET | Freq: Every day | ORAL | 0 refills | Status: DC
  Filled 2023-02-27: qty 30, 30d supply, fill #0

## 2023-02-27 MED ORDER — OLMESARTAN MEDOXOMIL 20 MG PO TABS
20.0000 mg | ORAL_TABLET | Freq: Every day | ORAL | 0 refills | Status: DC
  Filled 2023-02-27: qty 30, 30d supply, fill #0

## 2023-02-28 ENCOUNTER — Other Ambulatory Visit: Payer: Self-pay | Admitting: Medical

## 2023-02-28 ENCOUNTER — Other Ambulatory Visit (HOSPITAL_BASED_OUTPATIENT_CLINIC_OR_DEPARTMENT_OTHER): Payer: Self-pay

## 2023-02-28 MED ORDER — LISDEXAMFETAMINE DIMESYLATE 50 MG PO CAPS
50.0000 mg | ORAL_CAPSULE | Freq: Every day | ORAL | 0 refills | Status: DC
Start: 2023-02-28 — End: 2023-04-05
  Filled 2023-02-28: qty 30, 30d supply, fill #0

## 2023-02-28 NOTE — Telephone Encounter (Signed)
Requesting: Vyvanse 50 mg Contract: 04/11/2022 UDS: 04/11/2022 Last Visit: 10/18/2022 Next Visit: N/A Last Refill: 01/24/2023  Please Advise

## 2023-02-28 NOTE — Telephone Encounter (Signed)
Sent rx in. She will be due for controlled med visit early January. Please get her scheduled.

## 2023-03-02 ENCOUNTER — Encounter: Payer: Self-pay | Admitting: *Deleted

## 2023-03-02 NOTE — Telephone Encounter (Signed)
Tried calling but no answer and no vm.  Mychart message sent to patient to call us to make follow up visit.

## 2023-03-08 ENCOUNTER — Encounter: Payer: Self-pay | Admitting: Medical

## 2023-03-08 ENCOUNTER — Other Ambulatory Visit (HOSPITAL_BASED_OUTPATIENT_CLINIC_OR_DEPARTMENT_OTHER): Payer: Self-pay

## 2023-03-08 ENCOUNTER — Telehealth: Payer: Commercial Managed Care - PPO | Admitting: Medical

## 2023-03-08 VITALS — BP 125/85 | HR 125 | Temp 98.4°F | Resp 24

## 2023-03-08 DIAGNOSIS — H109 Unspecified conjunctivitis: Secondary | ICD-10-CM | POA: Diagnosis not present

## 2023-03-08 DIAGNOSIS — R062 Wheezing: Secondary | ICD-10-CM

## 2023-03-08 DIAGNOSIS — J069 Acute upper respiratory infection, unspecified: Secondary | ICD-10-CM | POA: Diagnosis not present

## 2023-03-08 DIAGNOSIS — R059 Cough, unspecified: Secondary | ICD-10-CM | POA: Diagnosis not present

## 2023-03-08 MED ORDER — AZITHROMYCIN 250 MG PO TABS
ORAL_TABLET | ORAL | 0 refills | Status: AC
Start: 2023-03-08 — End: 2023-03-13
  Filled 2023-03-08: qty 6, 5d supply, fill #0

## 2023-03-08 MED ORDER — BENZONATATE 100 MG PO CAPS
100.0000 mg | ORAL_CAPSULE | Freq: Three times a day (TID) | ORAL | 0 refills | Status: DC | PRN
  Filled 2023-03-08: qty 30, 10d supply, fill #0

## 2023-03-08 MED ORDER — METHYLPREDNISOLONE 4 MG PO TBPK
ORAL_TABLET | ORAL | 0 refills | Status: DC
  Filled 2023-03-08: qty 21, 6d supply, fill #0

## 2023-03-08 MED ORDER — ALBUTEROL SULFATE HFA 108 (90 BASE) MCG/ACT IN AERS
2.0000 | INHALATION_SPRAY | Freq: Four times a day (QID) | RESPIRATORY_TRACT | 0 refills | Status: DC | PRN
Start: 2023-03-08 — End: 2023-11-01
  Filled 2023-03-08: qty 6.7, 25d supply, fill #0

## 2023-03-08 MED ORDER — TOBRAMYCIN 0.3 % OP SOLN
1.0000 [drp] | Freq: Four times a day (QID) | OPHTHALMIC | 0 refills | Status: DC
Start: 2023-03-08 — End: 2023-11-01
  Filled 2023-03-08: qty 5, 25d supply, fill #0

## 2023-03-08 NOTE — Patient Instructions (Signed)
Upper Respiratory Infection, cough,  conjuntivitis and wheezing  Symptoms for 6 days with productive cough, sinus congestion, and conjunctivitis. No fever currently. Wheezing and shortness of breath on exertion. O2 sat 98%. No chest pain or calf pain. -Prescribe Tobrex eye drops for conjunctivitis. -Prescribe Benzonatate for cough. -Prescribe Albuterol inhaler for wheezing. -If not significantly improved by Saturday, start antibiotic  azithromycin. If sinus infection symptoms or more bronchitis like symptoms. -If Albuterol is needed every 6 hours, start Medrol 6-day taper. -Check vitals (BP, pulse, temp) and have coworker listen to lungs, report findings to me by my chart. -If not significantly improved by Monday or Tuesday, schedule in-office visit for physical exam.  General Health Maintenance -Continue to monitor symptoms and report any changes or worsening to the doctor.  Follow up early next week if signs/symptoms not improving or sooner if needed.

## 2023-03-08 NOTE — Progress Notes (Addendum)
Subjective:    Patient ID: [name], female    DOB: Jun 03, 1960, 62 y.o.   MRN: [id number]  HPI Virtual Visit via Video Note  I connected with Tahlia Trabue Kraeger on 03/08/23 at 11:00 AM EST by a video enabled telemedicine application and verified that I am speaking with the correct person using two identifiers.  Location: Patient: office Provider: [facility]. Pt works as Press photographer.   I discussed the limitations of evaluation and management by telemedicine and the availability of in person appointments. The patient expressed understanding and agreed to proceed.  History of Present Illness:      The patient, with a history of respiratory issues, reports being unwell for six days. She initially experienced a fever over the weekend, which has since resolved. Concurrently, she has been experiencing tinnitus, which she describes as a white noise sensation in her ears. This symptom has been present for five days and has been particularly severe over the past week.  The patient also reports symptoms of conjunctivitis, sinus pressure, and a productive cough with green and yellow sputum. She notes that the cough is particularly severe and is associated with wheezing and shortness of breath, especially upon exertion. Despite these symptoms, the patient's oxygen saturation was measured at 98% in the morning.  In addition to these symptoms, the patient has been experiencing significant nasal congestion, with white to yellowish drainage noted in the morning. She has been managing these symptoms with Claritin, Tylenol, and ibuprofen, along with increased fluid intake.  The patient has a history of wheezing during illness, with a notable episode following knee surgery a couple of years ago. She denies current wheezing but reports shortness of breath since the onset of her current illness. She has not used an albuterol inhaler in the past.  The patient has been self-monitoring her symptoms  at home and reports that her lungs sound clear on auscultation. She plans to have a coworker or respiratory therapist listen to her lungs at work for further assessment.  On discussion today asked pt if calfs symmetric, swollen or any pain behind knees. She reported no asymmetry, no swelling and no pain behind knees.       Observations/Objective: General-no acute distress, pleasant, oriented. Lungs- on inspection lungs appear unlabored. Neck- no tracheal deviation or jvd on inspection. Neuro- gross motor function appears intact.  BP 128/85, HR 125, O2 Sat 98%, R 24, T 98.4  Assessment and Plan:      Upper Respiratory Infection, cough,  conjuntivitis and wheezing  Symptoms for 6 days with productive cough, sinus congestion, and conjunctivitis. No fever currently. Wheezing and shortness of breath on exertion. O2 sat 98%. No chest pain or calf pain. -Prescribe Tobrex eye drops for conjunctivitis. -Prescribe Benzonatate for cough. -Prescribe Albuterol inhaler for wheezing. -If not significantly improved by Saturday, start antibiotic  azithromycin. If sinus infection symptoms or more bronchitis like symptoms. -If Albuterol is needed every 6 hours, start Medrol 6-day taper. -Check vitals (BP, pulse, temp) and have coworker listen to lungs, report findings to me by my chart. -If not significantly improved by Monday or Tuesday, schedule in-office visit for physical exam.  General Health Maintenance -Continue to monitor symptoms and report any changes or worsening to the doctor.  Follow up early next week if signs/symptoms not improving or sooner if needed.    Follow Up Instructions:    I discussed the assessment and treatment plan with the patient. The patient was provided an  opportunity to ask questions and all were answered. The patient agreed with the plan and demonstrated an understanding of the instructions.   The patient was advised to call back or seek an in-person evaluation if the  symptoms worsen or if the condition fails to improve as anticipated.     Esperanza Richters, PA-C    Review of Systems     Objective:   Physical Exam        Assessment & Plan:

## 2023-03-28 ENCOUNTER — Other Ambulatory Visit (HOSPITAL_BASED_OUTPATIENT_CLINIC_OR_DEPARTMENT_OTHER): Payer: Self-pay

## 2023-03-28 ENCOUNTER — Other Ambulatory Visit: Payer: Self-pay | Admitting: Medical

## 2023-03-29 ENCOUNTER — Other Ambulatory Visit (HOSPITAL_BASED_OUTPATIENT_CLINIC_OR_DEPARTMENT_OTHER): Payer: Self-pay

## 2023-03-29 ENCOUNTER — Ambulatory Visit: Payer: Commercial Managed Care - PPO | Admitting: Medical

## 2023-03-29 ENCOUNTER — Other Ambulatory Visit: Payer: Self-pay

## 2023-03-29 MED ORDER — OLMESARTAN MEDOXOMIL 20 MG PO TABS
20.0000 mg | ORAL_TABLET | Freq: Every day | ORAL | 0 refills | Status: DC
  Filled 2023-03-29: qty 30, 30d supply, fill #0

## 2023-03-29 MED ORDER — CHLORTHALIDONE 25 MG PO TABS
25.0000 mg | ORAL_TABLET | Freq: Every day | ORAL | 0 refills | Status: DC
  Filled 2023-03-29: qty 30, 30d supply, fill #0

## 2023-04-03 ENCOUNTER — Other Ambulatory Visit (HOSPITAL_BASED_OUTPATIENT_CLINIC_OR_DEPARTMENT_OTHER): Payer: Self-pay

## 2023-04-03 ENCOUNTER — Encounter: Payer: Self-pay | Admitting: Medical

## 2023-04-03 ENCOUNTER — Other Ambulatory Visit: Payer: Self-pay | Admitting: Medical

## 2023-04-03 ENCOUNTER — Ambulatory Visit: Payer: Commercial Managed Care - PPO | Admitting: Medical

## 2023-04-03 VITALS — BP 138/78 | HR 96 | Temp 98.0°F | Resp 18 | Ht 63.0 in | Wt 263.8 lb

## 2023-04-03 DIAGNOSIS — E669 Obesity, unspecified: Secondary | ICD-10-CM | POA: Diagnosis not present

## 2023-04-03 DIAGNOSIS — I10 Essential (primary) hypertension: Secondary | ICD-10-CM

## 2023-04-03 DIAGNOSIS — R739 Hyperglycemia, unspecified: Secondary | ICD-10-CM | POA: Diagnosis not present

## 2023-04-03 DIAGNOSIS — Z79899 Other long term (current) drug therapy: Secondary | ICD-10-CM

## 2023-04-03 DIAGNOSIS — E785 Hyperlipidemia, unspecified: Secondary | ICD-10-CM

## 2023-04-03 DIAGNOSIS — E876 Hypokalemia: Secondary | ICD-10-CM

## 2023-04-03 DIAGNOSIS — F900 Attention-deficit hyperactivity disorder, predominantly inattentive type: Secondary | ICD-10-CM | POA: Diagnosis not present

## 2023-04-03 MED ORDER — POTASSIUM CHLORIDE ER 10 MEQ PO TBCR
10.0000 meq | EXTENDED_RELEASE_TABLET | ORAL | 0 refills | Status: DC
  Filled 2023-04-03: qty 36, 84d supply, fill #0

## 2023-04-03 MED ORDER — SEMAGLUTIDE-WEIGHT MANAGEMENT 0.25 MG/0.5ML ~~LOC~~ SOAJ
0.2500 mg | SUBCUTANEOUS | 0 refills | Status: AC
  Filled 2023-04-03: qty 2, 28d supply, fill #0

## 2023-04-03 MED ORDER — GABAPENTIN 600 MG PO TABS
600.0000 mg | ORAL_TABLET | Freq: Every day | ORAL | 0 refills | Status: DC
  Filled 2023-04-03 – 2023-04-09 (×2): qty 90, 90d supply, fill #0

## 2023-04-03 MED ORDER — OMEPRAZOLE 20 MG PO CPDR
20.0000 mg | DELAYED_RELEASE_CAPSULE | Freq: Two times a day (BID) | ORAL | 6 refills | Status: DC
  Filled 2023-04-03 – 2023-04-18 (×6): qty 60, 30d supply, fill #0
  Filled 2023-05-17: qty 60, 30d supply, fill #1
  Filled 2023-06-20: qty 60, 30d supply, fill #2
  Filled 2023-07-25: qty 60, 30d supply, fill #3
  Filled 2023-08-26: qty 60, 30d supply, fill #4
  Filled 2023-10-04: qty 60, 30d supply, fill #5
  Filled 2023-11-06: qty 60, 30d supply, fill #6
  Filled ????-??-??: fill #0

## 2023-04-03 MED ORDER — SEMAGLUTIDE-WEIGHT MANAGEMENT 0.25 MG/0.5ML ~~LOC~~ SOAJ
SUBCUTANEOUS | 0 refills | Status: DC
  Filled 2023-04-03: qty 2, fill #0

## 2023-04-03 NOTE — Progress Notes (Signed)
 Subjective:    Patient ID: [name], female    DOB: 1960-07-14, 63 y.o.   MRN: [id number]  HPI  Pt in for follow up.  Discussed the use of AI scribe software for clinical note transcription with the patient, who gave verbal consent to proceed.  History of Present Illness   The patient, diagnosed with ADD and managed on Vyvanse  50mg  daily, presents with concerns regarding weight management and worsening GERD symptoms. She reports a trial of semiglutide last year, which was discontinued due to worsening reflux and insurance coverage issues. Despite these challenges, the patient expresses a desire to retry the medication, suggesting a higher dose omeprazole  might alleviate the reflux symptoms. She is hesitant to consider gastric bypass surgery at this time.  The patient also reports a history of hypertension, morbid obesity, and elevated cholesterol levels. She questions whether these conditions qualify as cardiovascular disease, which might influence her insurance coverage for semiglutide.  In addition to these concerns, the patient mentions increased stress from work, which she believes is contributing to her weight gain. She expresses concern about the impact of her weight on her heart health. Despite the stress, she reports that her ADD symptoms are well-managed with Vyvanse , which significantly improves her concentration and task completion.  The patient also reports taking Benicar  20mg  for hypertension management. She is due for a refill of Vyvanse  50mg  and Gabapentin (in near future), which she plans to request later in the week. She is also due for an A1c and metabolic panel, which have not been done in a while.           Review of Systems  Constitutional:  Negative for chills, fatigue and fever.  HENT:  Negative for congestion.   Respiratory:  Negative for chest tightness, shortness of breath and wheezing.   Cardiovascular:  Negative for chest pain and palpitations.   Gastrointestinal:  Negative for abdominal pain, constipation and nausea.  Genitourinary:  Negative for dysuria and frequency.  Musculoskeletal:  Negative for back pain, myalgias and neck stiffness.  Skin:  Negative for rash.  Neurological:  Negative for dizziness, seizures and headaches.  Hematological:  Negative for adenopathy. Does not bruise/bleed easily.  Psychiatric/Behavioral:  Positive for decreased concentration. Negative for behavioral problems and dysphoric mood.     Past Medical History:  Diagnosis Date   ADD (attention deficit disorder)    Arthritis    Depression    GERD (gastroesophageal reflux disease)    Hypertension    Psoriasis    Restless leg syndrome      Social History   Socioeconomic History   Marital status: Divorced    Spouse name: Not on file   Number of children: Not on file   Years of education: Not on file   Highest education level: Not on file  Occupational History   Not on file  Tobacco Use   Smoking status: Never   Smokeless tobacco: Never  Vaping Use   Vaping status: Never Used  Substance and Sexual Activity   Alcohol use: Yes    Alcohol/week: 3.0 standard drinks of alcohol    Types: 3 Glasses of wine per week    Comment: social   Drug use: No   Sexual activity: Not on file  Other Topics Concern   Not on file  Social History Narrative   Not on file   Social Drivers of Health   Financial Resource Strain: Not on file  Food Insecurity: Not on file  Transportation Needs:  Not on file  Physical Activity: Not on file  Stress: Not on file  Social Connections: Not on file  Intimate Partner Violence: Not on file    Past Surgical History:  Procedure Laterality Date   CESAREAN SECTION     DILATION AND CURETTAGE OF UTERUS  1998   TONSILLECTOMY     TOTAL KNEE ARTHROPLASTY Left 12/30/2020   Procedure: LEFT TOTAL KNEE ARTHROPLASTY;  Surgeon: Addie Cordella Hamilton, MD;  Location: [facility] OR;  Service: Orthopedics;  Laterality: Left;    Family  History  Problem Relation Age of Onset   Cancer Mother    Mental illness Father    Alcoholism Father    Mental illness Sister    Suicidality Sister    Healthy Son    Healthy Son    Healthy Daughter     No Known Allergies  Current Outpatient Medications on File Prior to Visit  Medication Sig Dispense Refill   albuterol  (VENTOLIN  HFA) 108 (90 Base) MCG/ACT inhaler Inhale 2 puffs into the lungs every 6 (six) hours as needed. 6.7 g 0   amoxicillin  (AMOXIL ) 500 MG capsule Take 4 capsules by mouth 1 hour prior to dental procedure 10 capsule 0   benzonatate  (TESSALON ) 100 MG capsule Take 1 capsule (100 mg total) by mouth 3 (three) times daily as needed for cough. 30 capsule 0   chlorthalidone  (HYGROTON ) 25 MG tablet Take 1 tablet (25 mg total) by mouth daily. 30 tablet 0   desvenlafaxine  (PRISTIQ ) 50 MG 24 hr tablet Take 1 tablet (50 mg total) by mouth daily. 30 tablet 3   gabapentin  (NEURONTIN ) 600 MG tablet Take 1 tablet (600 mg total) by mouth at bedtime. 90 tablet 1   influenza vac split quadrivalent PF (FLUARIX  QUADRIVALENT) 0.5 ML injection Inject into the muscle. 0.5 mL 0   lisdexamfetamine (VYVANSE ) 50 MG capsule Take 1 capsule (50 mg total) by mouth daily. 30 capsule 0   meloxicam  (MOBIC ) 7.5 MG tablet Take 1-2 tablets (7.5-15 mg total) by mouth daily. 30 tablet 1   methocarbamol  (ROBAXIN ) 500 MG tablet Take 1-2 tablets (500-1,000 mg total) by mouth every 6 (six) to 8 (eight) hours as needed for spasms/muscle tension 60 tablet 2   methylPREDNISolone  (MEDROL  DOSEPAK) 4 MG TBPK tablet Follow directions on package (standard 6 day taper) 21 tablet 0   olmesartan  (BENICAR ) 20 MG tablet Take 1 tablet (20 mg total) by mouth daily. 30 tablet 0   potassium chloride  (KLOR-CON ) 10 MEQ tablet Take 1 tablet (10 mEq total) by mouth every Monday, Wednesday, and Friday. 36 tablet 3   Risankizumab -rzaa (SKYRIZI  PEN) 150 MG/ML SOAJ Inject one pen on day 0 and then on week 4 then every 12 weeks after 1  mL 5   Tdap (BOOSTRIX ) 5-2.5-18.5 LF-MCG/0.5 injection Inject into the muscle. 0.5 mL 0   tobramycin  (TOBREX ) 0.3 % ophthalmic solution Place 1 drop into both eyes every 6 (six) hours. 5 mL 0   No current facility-administered medications on file prior to visit.    BP 138/78   Pulse 96   Temp 98 F (36.7 C) (Oral)   Resp 18   Ht 5' 3 (1.6 m)   Wt 263 lb 12.8 oz (119.7 kg)   LMP 12/25/2012 (Approximate)   SpO2 100%   BMI 46.73 kg/m        Objective:   Physical Exam  General Mental Status- Alert. General Appearance- Not in acute distress.   Skin General: Color- Normal Color. Moisture- Normal  Moisture.  Neck Carotid Arteries- Normal color. Moisture- Normal Moisture. No carotid bruits. No JVD.  Chest and Lung Exam Auscultation: Breath Sounds:-Normal.  Cardiovascular Auscultation:Rythm- Regular. Murmurs & Other Heart Sounds:Auscultation of the heart reveals- No Murmurs.  Abdomen Inspection:-Inspeection Normal. Palpation/Percussion:Note:No mass. Palpation and Percussion of the abdomen reveal- Non Tender, Non Distended + BS, no rebound or guarding.   Neurologic Cranial Nerve exam:- CN III-XII intact(No nystagmus), symmetric smile. Strength:- 5/5 equal and symmetric strength both upper and lower extremities.       Assessment & Plan:   Patient Instructions  Attention Deficit Disorder Stable on Vyvanse  50mg  daily. Reports significant improvement in concentration and task completion. -Continue Vyvanse  50mg  daily. -Perform urine drug screen and up date contract.  Obesity Previous trial of semaglutide  (Wegovy ) led to weight loss but exacerbated GERD. Patient wishes to retry medication. -Resubmit prescription for Wegovy  with diagnosis of obesity,high cholesterol, prediabets and hypertension. -Advise patient to monitor for worsening GERD symptoms.  Hypertension Blood pressure slightly elevated during visit, but patient reports stress and physical activity prior to  measurement. -Continue Benicar  20mg  daily. -Advise patient to monitor blood pressure at home.  Gastroesophageal Reflux Disease Exacerbated by previous trial of semaglutide . -increase omeprazole  to 20 mg bid. -Advise patient to monitor for worsening symptoms with re-initiation of Wegovy . -if severe notify me. Any vomiting or nausea would get lipase.  General Health Maintenance -Order A1C and metabolic panel today.  follow up 6 months or sooner if needed.

## 2023-04-03 NOTE — Patient Instructions (Addendum)
 Attention Deficit Disorder Stable on Vyvanse  50mg  daily. Reports significant improvement in concentration and task completion. -Continue Vyvanse  50mg  daily. -Perform urine drug screen and up date contract.  Obesity Previous trial of semaglutide  (Wegovy ) led to weight loss but exacerbated GERD. Patient wishes to retry medication. -Resubmit prescription for Wegovy  with diagnosis of obesity,high cholesterol, prediabets and hypertension. -Advise patient to monitor for worsening GERD symptoms.  Hypertension Blood pressure slightly elevated during visit, but patient reports stress and physical activity prior to measurement. -Continue Benicar  20mg  daily. -Advise patient to monitor blood pressure at home.  Gastroesophageal Reflux Disease Exacerbated by previous trial of semaglutide . -increase omeprazole  to 20 mg bid. -Advise patient to monitor for worsening symptoms with re-initiation of Wegovy . -if severe notify me. Any vomiting or nausea would get lipase.  General Health Maintenance -Order A1C and metabolic panel today.  follow up 6 months or sooner if needed.

## 2023-04-04 ENCOUNTER — Other Ambulatory Visit (HOSPITAL_BASED_OUTPATIENT_CLINIC_OR_DEPARTMENT_OTHER): Payer: Self-pay

## 2023-04-05 ENCOUNTER — Other Ambulatory Visit (HOSPITAL_BASED_OUTPATIENT_CLINIC_OR_DEPARTMENT_OTHER): Payer: Self-pay

## 2023-04-05 MED ORDER — LISDEXAMFETAMINE DIMESYLATE 50 MG PO CAPS
50.0000 mg | ORAL_CAPSULE | Freq: Every day | ORAL | 0 refills | Status: DC
  Filled 2023-04-05: qty 30, 30d supply, fill #0

## 2023-04-05 NOTE — Addendum Note (Signed)
 Addended by: Gwenevere Abbot on: 04/05/2023 06:52 AM   Modules accepted: Orders

## 2023-04-06 ENCOUNTER — Other Ambulatory Visit (HOSPITAL_BASED_OUTPATIENT_CLINIC_OR_DEPARTMENT_OTHER): Payer: Self-pay

## 2023-04-06 LAB — DRUG MONITORING PANEL 376104, URINE
Amphetamine: 3452 ng/mL — ABNORMAL HIGH (ref <250)
Amphetamines: POSITIVE ng/mL — AB (ref <500)
Barbiturates: NEGATIVE ng/mL (ref <300)
Benzodiazepines: NEGATIVE ng/mL (ref <100)
Cocaine Metabolite: NEGATIVE ng/mL (ref <150)
Desmethyltramadol: NEGATIVE ng/mL (ref <100)
Methamphetamine: NEGATIVE ng/mL (ref <250)
Opiates: NEGATIVE ng/mL (ref <100)
Oxycodone: NEGATIVE ng/mL (ref <100)
Tramadol: NEGATIVE ng/mL (ref <100)

## 2023-04-06 LAB — DM TEMPLATE

## 2023-04-09 ENCOUNTER — Other Ambulatory Visit (HOSPITAL_BASED_OUTPATIENT_CLINIC_OR_DEPARTMENT_OTHER): Payer: Self-pay

## 2023-04-18 ENCOUNTER — Other Ambulatory Visit (HOSPITAL_BASED_OUTPATIENT_CLINIC_OR_DEPARTMENT_OTHER): Payer: Self-pay

## 2023-04-18 ENCOUNTER — Other Ambulatory Visit: Payer: Self-pay | Admitting: Medical

## 2023-04-18 MED ORDER — MELOXICAM 7.5 MG PO TABS
7.5000 mg | ORAL_TABLET | Freq: Every day | ORAL | 1 refills | Status: DC
  Filled 2023-04-18: qty 30, 15d supply, fill #0
  Filled 2023-05-05: qty 30, 15d supply, fill #1

## 2023-04-18 NOTE — Telephone Encounter (Signed)
Rx refill meloxicam sent to pharmacy.

## 2023-04-19 ENCOUNTER — Other Ambulatory Visit (HOSPITAL_BASED_OUTPATIENT_CLINIC_OR_DEPARTMENT_OTHER): Payer: Self-pay

## 2023-04-22 ENCOUNTER — Encounter: Payer: Self-pay | Admitting: Medical

## 2023-05-05 ENCOUNTER — Other Ambulatory Visit: Payer: Self-pay | Admitting: Medical

## 2023-05-05 ENCOUNTER — Other Ambulatory Visit (HOSPITAL_BASED_OUTPATIENT_CLINIC_OR_DEPARTMENT_OTHER): Payer: Self-pay

## 2023-05-06 ENCOUNTER — Other Ambulatory Visit: Payer: Self-pay

## 2023-05-07 NOTE — Telephone Encounter (Signed)
 Requesting: vyvanse  Contract:04/12/23 UDS:04/12/23 Last Visit:04/03/23 Next Visit:n/a Last Refill:04/05/23  Please Advise

## 2023-05-08 ENCOUNTER — Other Ambulatory Visit (HOSPITAL_BASED_OUTPATIENT_CLINIC_OR_DEPARTMENT_OTHER): Payer: Self-pay

## 2023-05-08 MED ORDER — OLMESARTAN MEDOXOMIL 20 MG PO TABS
20.0000 mg | ORAL_TABLET | Freq: Every day | ORAL | 3 refills | Status: DC
  Filled 2023-05-08: qty 90, 90d supply, fill #0
  Filled 2023-08-04: qty 90, 90d supply, fill #1
  Filled 2023-11-06: qty 90, 90d supply, fill #2
  Filled 2024-02-04: qty 90, 90d supply, fill #3

## 2023-05-08 MED ORDER — CHLORTHALIDONE 25 MG PO TABS
25.0000 mg | ORAL_TABLET | Freq: Every day | ORAL | 3 refills | Status: AC
  Filled 2023-05-08: qty 90, 90d supply, fill #0
  Filled 2023-08-04: qty 90, 90d supply, fill #1
  Filled 2023-11-06: qty 90, 90d supply, fill #2
  Filled 2024-02-04: qty 90, 90d supply, fill #3

## 2023-05-08 MED ORDER — LISDEXAMFETAMINE DIMESYLATE 50 MG PO CAPS
50.0000 mg | ORAL_CAPSULE | Freq: Every day | ORAL | 0 refills | Status: DC
  Filled 2023-05-08: qty 30, 30d supply, fill #0

## 2023-05-08 MED ORDER — DESVENLAFAXINE SUCCINATE ER 50 MG PO TB24
50.0000 mg | ORAL_TABLET | Freq: Every day | ORAL | 3 refills | Status: DC
  Filled 2023-05-08: qty 30, 30d supply, fill #0
  Filled 2023-06-04: qty 30, 30d supply, fill #1
  Filled 2023-07-08: qty 30, 30d supply, fill #2
  Filled 2023-08-09: qty 30, 30d supply, fill #3

## 2023-05-08 NOTE — Telephone Encounter (Signed)
Rx meds sent in to pharmacy.

## 2023-05-17 ENCOUNTER — Other Ambulatory Visit (HOSPITAL_BASED_OUTPATIENT_CLINIC_OR_DEPARTMENT_OTHER): Payer: Self-pay

## 2023-05-24 ENCOUNTER — Other Ambulatory Visit (HOSPITAL_BASED_OUTPATIENT_CLINIC_OR_DEPARTMENT_OTHER): Payer: Self-pay

## 2023-05-24 ENCOUNTER — Telehealth: Payer: Commercial Managed Care - PPO | Admitting: Medical

## 2023-05-24 DIAGNOSIS — R300 Dysuria: Secondary | ICD-10-CM

## 2023-05-24 DIAGNOSIS — R350 Frequency of micturition: Secondary | ICD-10-CM | POA: Diagnosis not present

## 2023-05-24 MED ORDER — CEPHALEXIN 500 MG PO CAPS
500.0000 mg | ORAL_CAPSULE | Freq: Two times a day (BID) | ORAL | 0 refills | Status: DC
  Filled 2023-05-24: qty 14, 7d supply, fill #0

## 2023-05-24 NOTE — Patient Instructions (Signed)
 Urinary Tract Infection (UTI) Dysuria and increased urinary frequency for three days. No fever, chills, or flank pain. History of UTIs, but none recent. -Start Keflex for 7 days after submitting sample tomorrow. -Collect urine sample for urinalysis and culture on 05/25/2023 after 1 pm. -After providing urine sample, start over-the-counter Azostandard for symptom relief.  -Ensure adequate hydration.  Follow-up date to be determined after urine study results and based on how you do clinically. Pending urine culture results.

## 2023-05-24 NOTE — Progress Notes (Signed)
 Virtual Visit via Video Note  I connected with [name] on 05/24/23 at  2:00 PM EST by a video enabled telemedicine application and verified that I am speaking with the correct person using two identifiers.  Location: Patient: home [place] Provider: office   I discussed the limitations of evaluation and management by telemedicine and the availability of in person appointments. The patient expressed understanding and agreed to proceed.  History of Present Illness: Discussed the use of AI scribe software for clinical note transcription with the patient, who gave verbal consent to proceed.  History of Present Illness   [name] is a 63 year old female who presents with urinary symptoms suggestive of a urinary tract infection (UTI).  She has been experiencing dysuria for the past three days, along with increased frequency of urination. There is no fever, chills, sweats, or pain over the kidneys. She describes a sensation of pressure over the bladder.  She has been taking ibuprofen and Tylenol for symptom relief but has not used Azostandard. She is not allergic to any antibiotics.  It has been years since she last experienced a urinary tract infection.         Observations/Objective: General-no acute distress, pleasant, oriented. Lungs- on inspection lungs appear unlabored. Neck- no tracheal deviation or jvd on inspection. Neuro- gross motor function appears intact.  Assessment and Plan: Assessment and Plan    Urinary Tract Infection (UTI) Dysuria and increased urinary frequency for three days. No fever, chills, or flank pain. History of UTIs, but none recent. -Start Keflex for 7 days after submitting sample tomorrow. -Collect urine sample for urinalysis and culture on 05/25/2023 after 1 pm. -After providing urine sample, start over-the-counter Azostandard for symptom relief.  -Ensure adequate hydration.  Follow-up date to be determined after urine study results and based on  how you do clinically. Pending urine culture results.      Esperanza Richters, PA-C   Follow Up Instructions:    I discussed the assessment and treatment plan with the patient. The patient was provided an opportunity to ask questions and all were answered. The patient agreed with the plan and demonstrated an understanding of the instructions.   The patient was advised to call back or seek an in-person evaluation if the symptoms worsen or if the condition fails to improve as anticipated.   Esperanza Richters, PA-C   Subjective:    Patient ID: [name], female    DOB: November 19, 1960, 63 y.o.   MRN: [id number]  HPI        Review of Systems  Constitutional:  Negative for chills and fatigue.  HENT:  Negative for congestion.   Respiratory:  Negative for cough, chest tightness, shortness of breath and wheezing.   Cardiovascular:  Negative for chest pain and palpitations.  Gastrointestinal:  Negative for abdominal pain, blood in stool, diarrhea and vomiting.  Genitourinary:  Positive for dysuria and frequency. Negative for urgency.  Musculoskeletal:  Negative for back pain.  Neurological:  Negative for dizziness, weakness and numbness.  Hematological:  Negative for adenopathy. Does not bruise/bleed easily.  Psychiatric/Behavioral:  Negative for behavioral problems and decreased concentration.     Past Medical History:  Diagnosis Date   ADD (attention deficit disorder)    Arthritis    Depression    GERD (gastroesophageal reflux disease)    Hypertension    Psoriasis    Restless leg syndrome      Social History   Socioeconomic History   Marital status:  Divorced    Spouse name: Not on file   Number of children: Not on file   Years of education: Not on file   Highest education level: Not on file  Occupational History   Not on file  Tobacco Use   Smoking status: Never   Smokeless tobacco: Never  Vaping Use   Vaping status: Never Used  Substance and Sexual Activity    Alcohol use: Yes    Alcohol/week: 3.0 standard drinks of alcohol    Types: 3 Glasses of wine per week    Comment: social   Drug use: No   Sexual activity: Not on file  Other Topics Concern   Not on file  Social History Narrative   Not on file   Social Drivers of Health   Financial Resource Strain: Not on file  Food Insecurity: Not on file  Transportation Needs: Not on file  Physical Activity: Not on file  Stress: Not on file  Social Connections: Not on file  Intimate Partner Violence: Not on file    Past Surgical History:  Procedure Laterality Date   CESAREAN SECTION     DILATION AND CURETTAGE OF UTERUS  1998   TONSILLECTOMY     TOTAL KNEE ARTHROPLASTY Left 12/30/2020   Procedure: LEFT TOTAL KNEE ARTHROPLASTY;  Surgeon: Cammy Copa, MD;  Location: [facility] OR;  Service: Orthopedics;  Laterality: Left;    Family History  Problem Relation Age of Onset   Cancer Mother    Mental illness Father    Alcoholism Father    Mental illness Sister    Suicidality Sister    Healthy Son    Healthy Son    Healthy Daughter     No Known Allergies  Current Outpatient Medications on File Prior to Visit  Medication Sig Dispense Refill   albuterol (VENTOLIN HFA) 108 (90 Base) MCG/ACT inhaler Inhale 2 puffs into the lungs every 6 (six) hours as needed. 6.7 g 0   amoxicillin (AMOXIL) 500 MG capsule Take 4 capsules by mouth 1 hour prior to dental procedure 10 capsule 0   benzonatate (TESSALON) 100 MG capsule Take 1 capsule (100 mg total) by mouth 3 (three) times daily as needed for cough. 30 capsule 0   chlorthalidone (HYGROTON) 25 MG tablet Take 1 tablet (25 mg total) by mouth daily. 90 tablet 3   desvenlafaxine (PRISTIQ) 50 MG 24 hr tablet Take 1 tablet (50 mg total) by mouth daily. 30 tablet 3   gabapentin (NEURONTIN) 600 MG tablet Take 1 tablet (600 mg total) by mouth at bedtime. 90 tablet 0   influenza vac split quadrivalent PF (FLUARIX QUADRIVALENT) 0.5 ML injection Inject into the  muscle. 0.5 mL 0   lisdexamfetamine (VYVANSE) 50 MG capsule Take 1 capsule (50 mg total) by mouth daily. 30 capsule 0   meloxicam (MOBIC) 7.5 MG tablet Take 1-2 tablets (7.5-15 mg total) by mouth daily. 30 tablet 1   methocarbamol (ROBAXIN) 500 MG tablet Take 1-2 tablets (500-1,000 mg total) by mouth every 6 (six) to 8 (eight) hours as needed for spasms/muscle tension 60 tablet 2   methylPREDNISolone (MEDROL DOSEPAK) 4 MG TBPK tablet Follow directions on package (standard 6 day taper) 21 tablet 0   olmesartan (BENICAR) 20 MG tablet Take 1 tablet (20 mg total) by mouth daily. 90 tablet 3   omeprazole (PRILOSEC) 20 MG capsule Take 1 capsule (20 mg total) by mouth 2 (two) times daily before a meal. 60 capsule 6   potassium chloride (KLOR-CON)  10 MEQ tablet Take 1 tablet (10 mEq total) by mouth every Monday, Wednesday, and Friday. 36 tablet 0   Risankizumab-rzaa (SKYRIZI PEN) 150 MG/ML SOAJ Inject one pen on day 0 and then on week 4 then every 12 weeks after 1 mL 5   Tdap (BOOSTRIX) 5-2.5-18.5 LF-MCG/0.5 injection Inject into the muscle. 0.5 mL 0   tobramycin (TOBREX) 0.3 % ophthalmic solution Place 1 drop into both eyes every 6 (six) hours. 5 mL 0   No current facility-administered medications on file prior to visit.    LMP 12/25/2012 (Approximate)        Objective:   Physical Exam        Assessment & Plan:

## 2023-05-25 ENCOUNTER — Other Ambulatory Visit: Payer: Commercial Managed Care - PPO

## 2023-05-28 ENCOUNTER — Encounter: Payer: Self-pay | Admitting: Medical

## 2023-06-01 ENCOUNTER — Other Ambulatory Visit (HOSPITAL_BASED_OUTPATIENT_CLINIC_OR_DEPARTMENT_OTHER): Payer: Self-pay

## 2023-06-04 ENCOUNTER — Other Ambulatory Visit (HOSPITAL_BASED_OUTPATIENT_CLINIC_OR_DEPARTMENT_OTHER): Payer: Self-pay

## 2023-06-04 ENCOUNTER — Other Ambulatory Visit: Payer: Self-pay | Admitting: Medical

## 2023-06-05 NOTE — Telephone Encounter (Signed)
 Requesting: vyvanse Contract:04/12/23 UDS:04/12/23 Last Visit:04/03/23 Next Visit:n/a Last Refill:05/08/23  Please Advise

## 2023-06-07 ENCOUNTER — Other Ambulatory Visit (HOSPITAL_BASED_OUTPATIENT_CLINIC_OR_DEPARTMENT_OTHER): Payer: Self-pay

## 2023-06-07 MED ORDER — MELOXICAM 7.5 MG PO TABS
7.5000 mg | ORAL_TABLET | Freq: Every day | ORAL | 0 refills | Status: DC
  Filled 2023-06-07: qty 30, 15d supply, fill #0

## 2023-06-07 MED ORDER — LISDEXAMFETAMINE DIMESYLATE 50 MG PO CAPS
50.0000 mg | ORAL_CAPSULE | Freq: Every day | ORAL | 0 refills | Status: DC
  Filled 2023-06-07: qty 30, 30d supply, fill #0

## 2023-06-07 NOTE — Telephone Encounter (Signed)
 Rx refill sent to pharmacy.

## 2023-06-10 ENCOUNTER — Other Ambulatory Visit: Payer: Self-pay | Admitting: Medical

## 2023-06-11 ENCOUNTER — Other Ambulatory Visit (HOSPITAL_BASED_OUTPATIENT_CLINIC_OR_DEPARTMENT_OTHER): Payer: Self-pay

## 2023-06-11 MED ORDER — GABAPENTIN 600 MG PO TABS
600.0000 mg | ORAL_TABLET | Freq: Every day | ORAL | 0 refills | Status: DC
  Filled 2023-06-11 – 2023-06-13 (×2): qty 90, 90d supply, fill #0

## 2023-06-13 ENCOUNTER — Other Ambulatory Visit (HOSPITAL_BASED_OUTPATIENT_CLINIC_OR_DEPARTMENT_OTHER): Payer: Self-pay

## 2023-06-15 ENCOUNTER — Encounter: Payer: Self-pay | Admitting: Medical

## 2023-06-18 ENCOUNTER — Other Ambulatory Visit (HOSPITAL_BASED_OUTPATIENT_CLINIC_OR_DEPARTMENT_OTHER): Payer: Self-pay

## 2023-06-18 MED ORDER — GABAPENTIN 300 MG PO CAPS
300.0000 mg | ORAL_CAPSULE | Freq: Three times a day (TID) | ORAL | 3 refills | Status: DC
  Filled 2023-06-18: qty 90, 30d supply, fill #0
  Filled 2023-07-14: qty 90, 30d supply, fill #1
  Filled 2023-08-14: qty 90, 30d supply, fill #2
  Filled 2023-09-12: qty 90, 30d supply, fill #3

## 2023-06-18 NOTE — Addendum Note (Signed)
 Addended by: Gwenevere Abbot on: 06/18/2023 08:04 AM   Modules accepted: Orders

## 2023-06-19 ENCOUNTER — Other Ambulatory Visit (HOSPITAL_BASED_OUTPATIENT_CLINIC_OR_DEPARTMENT_OTHER): Payer: Self-pay

## 2023-06-20 ENCOUNTER — Other Ambulatory Visit (HOSPITAL_BASED_OUTPATIENT_CLINIC_OR_DEPARTMENT_OTHER): Payer: Self-pay

## 2023-07-06 ENCOUNTER — Other Ambulatory Visit: Payer: Self-pay | Admitting: Medical

## 2023-07-06 ENCOUNTER — Other Ambulatory Visit (HOSPITAL_BASED_OUTPATIENT_CLINIC_OR_DEPARTMENT_OTHER): Payer: Self-pay

## 2023-07-06 MED ORDER — MELOXICAM 7.5 MG PO TABS
7.5000 mg | ORAL_TABLET | Freq: Every day | ORAL | 0 refills | Status: DC
Start: 2023-07-06 — End: 2023-07-30
  Filled 2023-07-06: qty 30, 15d supply, fill #0

## 2023-07-06 NOTE — Telephone Encounter (Signed)
Rx refill sent to pt pharmacy 

## 2023-07-08 ENCOUNTER — Other Ambulatory Visit: Payer: Self-pay | Admitting: Medical

## 2023-07-09 ENCOUNTER — Other Ambulatory Visit (HOSPITAL_BASED_OUTPATIENT_CLINIC_OR_DEPARTMENT_OTHER): Payer: Self-pay

## 2023-07-09 NOTE — Telephone Encounter (Signed)
 Requesting: Vyvanse 50 mg Contract: 04/03/2023 UDS: 04/03/2023 Last Visit: 04/29/2023 VV 04/03/2023 OV Next Visit: N/A Last Refill: 06/07/2023  Please Advise

## 2023-07-10 ENCOUNTER — Other Ambulatory Visit (HOSPITAL_BASED_OUTPATIENT_CLINIC_OR_DEPARTMENT_OTHER): Payer: Self-pay

## 2023-07-10 MED ORDER — POTASSIUM CHLORIDE ER 10 MEQ PO TBCR
10.0000 meq | EXTENDED_RELEASE_TABLET | ORAL | 0 refills | Status: DC
  Filled 2023-07-10: qty 36, 84d supply, fill #0

## 2023-07-10 MED ORDER — LISDEXAMFETAMINE DIMESYLATE 50 MG PO CAPS
50.0000 mg | ORAL_CAPSULE | Freq: Every day | ORAL | 0 refills | Status: DC
  Filled 2023-07-10: qty 30, 30d supply, fill #0

## 2023-07-10 NOTE — Addendum Note (Signed)
 Addended by: Serafina Damme on: 07/10/2023 05:56 PM   Modules accepted: Orders

## 2023-07-10 NOTE — Telephone Encounter (Signed)
 Sent rx in. Pt need to follow up in June for controlled med visit. Also I want her to go ahead and get cmp to check potassium level. Place future order. Have her schedule lab visit to check k level within a week.

## 2023-07-16 ENCOUNTER — Other Ambulatory Visit (HOSPITAL_BASED_OUTPATIENT_CLINIC_OR_DEPARTMENT_OTHER): Payer: Self-pay

## 2023-07-25 ENCOUNTER — Other Ambulatory Visit (HOSPITAL_BASED_OUTPATIENT_CLINIC_OR_DEPARTMENT_OTHER): Payer: Self-pay

## 2023-07-30 ENCOUNTER — Other Ambulatory Visit: Payer: Self-pay | Admitting: Medical

## 2023-07-30 ENCOUNTER — Ambulatory Visit: Admitting: Medical

## 2023-07-30 ENCOUNTER — Other Ambulatory Visit (HOSPITAL_BASED_OUTPATIENT_CLINIC_OR_DEPARTMENT_OTHER): Payer: Self-pay

## 2023-07-30 MED ORDER — MELOXICAM 7.5 MG PO TABS
7.5000 mg | ORAL_TABLET | Freq: Every day | ORAL | 0 refills | Status: DC
  Filled 2023-07-30: qty 30, 15d supply, fill #0

## 2023-07-31 ENCOUNTER — Ambulatory Visit: Admitting: Medical

## 2023-08-05 ENCOUNTER — Other Ambulatory Visit (HOSPITAL_BASED_OUTPATIENT_CLINIC_OR_DEPARTMENT_OTHER): Payer: Self-pay

## 2023-08-09 ENCOUNTER — Other Ambulatory Visit (HOSPITAL_BASED_OUTPATIENT_CLINIC_OR_DEPARTMENT_OTHER): Payer: Self-pay

## 2023-08-09 ENCOUNTER — Other Ambulatory Visit: Payer: Self-pay | Admitting: Medical

## 2023-08-09 NOTE — Telephone Encounter (Signed)
 Requesting: Vyvanse  50mg   Contract: 04/03/23 UDS: 04/03/23 Last Visit: 05/24/23 Next Visit: None Last Refill: 07/10/23 #30 and 0RF   Please Advise

## 2023-08-10 ENCOUNTER — Other Ambulatory Visit (HOSPITAL_BASED_OUTPATIENT_CLINIC_OR_DEPARTMENT_OTHER): Payer: Self-pay

## 2023-08-10 MED ORDER — LISDEXAMFETAMINE DIMESYLATE 50 MG PO CAPS
50.0000 mg | ORAL_CAPSULE | Freq: Every day | ORAL | 0 refills | Status: DC
  Filled 2023-08-10: qty 30, 30d supply, fill #0

## 2023-08-15 ENCOUNTER — Other Ambulatory Visit (HOSPITAL_BASED_OUTPATIENT_CLINIC_OR_DEPARTMENT_OTHER): Payer: Self-pay

## 2023-08-26 ENCOUNTER — Other Ambulatory Visit: Payer: Self-pay | Admitting: Medical

## 2023-08-27 ENCOUNTER — Other Ambulatory Visit (HOSPITAL_BASED_OUTPATIENT_CLINIC_OR_DEPARTMENT_OTHER): Payer: Self-pay

## 2023-08-27 ENCOUNTER — Other Ambulatory Visit: Payer: Self-pay

## 2023-08-27 MED ORDER — MELOXICAM 7.5 MG PO TABS
7.5000 mg | ORAL_TABLET | Freq: Every day | ORAL | 0 refills | Status: DC
  Filled 2023-08-27: qty 30, 15d supply, fill #0

## 2023-09-08 ENCOUNTER — Other Ambulatory Visit: Payer: Self-pay | Admitting: Family

## 2023-09-08 ENCOUNTER — Other Ambulatory Visit: Payer: Self-pay | Admitting: Medical

## 2023-09-10 ENCOUNTER — Other Ambulatory Visit (HOSPITAL_BASED_OUTPATIENT_CLINIC_OR_DEPARTMENT_OTHER): Payer: Self-pay

## 2023-09-10 MED ORDER — DESVENLAFAXINE SUCCINATE ER 50 MG PO TB24
50.0000 mg | ORAL_TABLET | Freq: Every day | ORAL | 3 refills | Status: DC
  Filled 2023-09-10: qty 30, 30d supply, fill #0
  Filled 2023-10-09: qty 30, 30d supply, fill #1
  Filled 2023-11-15: qty 30, 30d supply, fill #2
  Filled 2023-12-13 – 2023-12-14 (×2): qty 30, 30d supply, fill #3

## 2023-09-12 ENCOUNTER — Other Ambulatory Visit (HOSPITAL_BASED_OUTPATIENT_CLINIC_OR_DEPARTMENT_OTHER): Payer: Self-pay

## 2023-09-13 ENCOUNTER — Other Ambulatory Visit (HOSPITAL_BASED_OUTPATIENT_CLINIC_OR_DEPARTMENT_OTHER): Payer: Self-pay

## 2023-09-13 ENCOUNTER — Encounter: Payer: Self-pay | Admitting: Medical

## 2023-09-14 ENCOUNTER — Other Ambulatory Visit (HOSPITAL_BASED_OUTPATIENT_CLINIC_OR_DEPARTMENT_OTHER): Payer: Self-pay

## 2023-09-14 ENCOUNTER — Other Ambulatory Visit: Payer: Self-pay | Admitting: Family

## 2023-09-14 NOTE — Telephone Encounter (Signed)
 Requesting: vyvanse  Contract:04/12/23 UDS:04/03/23 Last Visit:04/03/23 Next Visit:n/a Last Refill:08/10/23  Please Advise

## 2023-09-15 ENCOUNTER — Other Ambulatory Visit (HOSPITAL_BASED_OUTPATIENT_CLINIC_OR_DEPARTMENT_OTHER): Payer: Self-pay

## 2023-09-15 ENCOUNTER — Other Ambulatory Visit: Payer: Self-pay

## 2023-09-15 MED ORDER — LISDEXAMFETAMINE DIMESYLATE 50 MG PO CAPS
50.0000 mg | ORAL_CAPSULE | Freq: Every day | ORAL | 0 refills | Status: DC
  Filled 2023-09-15: qty 30, 30d supply, fill #0

## 2023-09-15 NOTE — Telephone Encounter (Signed)
 Sent rx in today. Pt needs controlled med visit next month before further refills. Please get her scheduled.

## 2023-09-17 ENCOUNTER — Other Ambulatory Visit: Payer: Self-pay

## 2023-09-21 ENCOUNTER — Other Ambulatory Visit (HOSPITAL_BASED_OUTPATIENT_CLINIC_OR_DEPARTMENT_OTHER): Payer: Self-pay

## 2023-09-21 ENCOUNTER — Other Ambulatory Visit: Payer: Self-pay | Admitting: Medical

## 2023-09-21 MED ORDER — MELOXICAM 7.5 MG PO TABS
7.5000 mg | ORAL_TABLET | Freq: Every day | ORAL | 0 refills | Status: DC
  Filled 2023-09-21: qty 30, 15d supply, fill #0

## 2023-09-24 ENCOUNTER — Other Ambulatory Visit (HOSPITAL_BASED_OUTPATIENT_CLINIC_OR_DEPARTMENT_OTHER): Payer: Self-pay

## 2023-10-04 ENCOUNTER — Other Ambulatory Visit: Payer: Self-pay | Admitting: Medical

## 2023-10-05 ENCOUNTER — Other Ambulatory Visit: Payer: Self-pay | Admitting: Medical

## 2023-10-05 ENCOUNTER — Other Ambulatory Visit: Payer: Self-pay

## 2023-10-05 ENCOUNTER — Other Ambulatory Visit (HOSPITAL_BASED_OUTPATIENT_CLINIC_OR_DEPARTMENT_OTHER): Payer: Self-pay

## 2023-10-05 MED ORDER — POTASSIUM CHLORIDE ER 10 MEQ PO TBCR
10.0000 meq | EXTENDED_RELEASE_TABLET | ORAL | 0 refills | Status: DC
  Filled 2023-10-05: qty 12, 28d supply, fill #0

## 2023-10-09 ENCOUNTER — Other Ambulatory Visit: Payer: Self-pay | Admitting: Medical

## 2023-10-10 ENCOUNTER — Other Ambulatory Visit (HOSPITAL_BASED_OUTPATIENT_CLINIC_OR_DEPARTMENT_OTHER): Payer: Self-pay

## 2023-10-10 MED ORDER — GABAPENTIN 300 MG PO CAPS
300.0000 mg | ORAL_CAPSULE | Freq: Three times a day (TID) | ORAL | 3 refills | Status: DC
  Filled 2023-10-10 – 2023-10-15 (×2): qty 90, 30d supply, fill #0
  Filled 2023-11-15: qty 90, 30d supply, fill #1
  Filled 2023-12-13 – 2023-12-14 (×2): qty 90, 30d supply, fill #2
  Filled 2024-01-12: qty 90, 30d supply, fill #3

## 2023-10-10 MED ORDER — MELOXICAM 7.5 MG PO TABS
7.5000 mg | ORAL_TABLET | Freq: Every day | ORAL | 0 refills | Status: DC
  Filled 2023-10-10: qty 30, 15d supply, fill #0

## 2023-10-15 ENCOUNTER — Other Ambulatory Visit (HOSPITAL_BASED_OUTPATIENT_CLINIC_OR_DEPARTMENT_OTHER): Payer: Self-pay

## 2023-10-24 ENCOUNTER — Ambulatory Visit: Admitting: Medical

## 2023-10-25 ENCOUNTER — Ambulatory Visit: Admitting: Medical

## 2023-10-30 ENCOUNTER — Ambulatory Visit: Admitting: Medical

## 2023-10-31 ENCOUNTER — Ambulatory Visit: Admitting: Medical

## 2023-11-01 ENCOUNTER — Other Ambulatory Visit (HOSPITAL_BASED_OUTPATIENT_CLINIC_OR_DEPARTMENT_OTHER): Payer: Self-pay

## 2023-11-01 ENCOUNTER — Encounter: Payer: Self-pay | Admitting: Medical

## 2023-11-01 ENCOUNTER — Ambulatory Visit: Admitting: Medical

## 2023-11-01 ENCOUNTER — Other Ambulatory Visit: Payer: Self-pay | Admitting: Medical

## 2023-11-01 VITALS — BP 138/88 | HR 98 | Temp 97.9°F | Resp 16 | Ht 63.0 in | Wt 245.0 lb

## 2023-11-01 DIAGNOSIS — F988 Other specified behavioral and emotional disorders with onset usually occurring in childhood and adolescence: Secondary | ICD-10-CM | POA: Diagnosis not present

## 2023-11-01 DIAGNOSIS — Z111 Encounter for screening for respiratory tuberculosis: Secondary | ICD-10-CM | POA: Diagnosis not present

## 2023-11-01 DIAGNOSIS — L409 Psoriasis, unspecified: Secondary | ICD-10-CM

## 2023-11-01 DIAGNOSIS — I10 Essential (primary) hypertension: Secondary | ICD-10-CM | POA: Diagnosis not present

## 2023-11-01 MED ORDER — TRIAMCINOLONE ACETONIDE 0.1 % EX CREA
1.0000 | TOPICAL_CREAM | Freq: Two times a day (BID) | CUTANEOUS | 0 refills | Status: AC
  Filled 2023-11-01: qty 30, 15d supply, fill #0

## 2023-11-01 MED ORDER — CLOBETASOL PROPIONATE 0.05 % EX SOLN
1.0000 | Freq: Two times a day (BID) | CUTANEOUS | 0 refills | Status: DC | PRN
Start: 2023-11-01 — End: 2023-12-13
  Filled 2023-11-01: qty 50, 25d supply, fill #0

## 2023-11-01 MED ORDER — LISDEXAMFETAMINE DIMESYLATE 50 MG PO CAPS
50.0000 mg | ORAL_CAPSULE | Freq: Every day | ORAL | 0 refills | Status: DC
  Filled 2023-11-01: qty 30, 30d supply, fill #0

## 2023-11-01 NOTE — Progress Notes (Unsigned)
   Subjective:    Patient ID: [name], female    DOB: March 30, 1960, 63 y.o.   MRN: [id number]  HPI  Beuna KEA CALLAN is a 63 year old female who presents for a follow-up visit for ADD management.  She is currently prescribed Vyvanse  50 mg, which she typically takes when working. However, she has been out of it for about a week due to her son's hospitalization.  She has a history of psoriasis, primarily affecting her scalp and behind one ear. She was previously on Skyrizi , which effectively managed her scalp psoriasis, but discontinued it due to high out-of-pocket costs. She uses clobetasol  solution for her scalp and behind her ear, which she finds helpful, and tea tree shampoo.  She is currently on chlorthalidone  25 mg daily. She mentions drinking a 20-ounce coffee today.  Her son was admitted to the hospital on July 26 for chest pain. She notes a family history of heart issues, with her ex-husband having had a heart attack at 32 and high triglycerides.  She has not had a metabolic panel done since last year and mentions a previous issue with being charged for a test she did not complete.  Review of Systems     Objective:   Physical Exam        Assessment & Plan:   Patient Instructions  Attention-deficit disorder (ADD) ADD managed with Vyvanse  50 mg. Concern for elevated blood pressure with Vyvanse  use. - Check blood pressure at work over the weekend. - Avoid caffeine when taking Vyvanse . - If blood pressure is elevated, increase Benicar  to 40 mg would be plan if over 140/90. - Continue chlorthalidone  25 mg daily.  Hypertension Hypertension managed with chlorthalidone  and Benicar . Current blood pressure 138/88 mmHg, possibly affected by caffeine. continue chlorthalidone . - Check blood pressure at work over the weekend. - Avoid caffeine when taking Vyvanse . - If blood pressure is elevated, increase Benicar  to 40 mg. - Order metabolic panel to monitor hypertension  management.  Psoriasis of scalp and  behind ears Psoriasis managed with clobetasol  and triamcinolone . Skyrizi  effective but discontinued due to cost. - Prescribe clobetasol  solution for scalp use twice weekly. - Prescribe triamcinolone  for use behind the ears as needed.(i think this better option as less side effects) - Consider resuming Skyrizi  if financially feasible.  Follow up 6 months or sooner if needed

## 2023-11-01 NOTE — Patient Instructions (Signed)
 Attention-deficit disorder (ADD) ADD managed with Vyvanse  50 mg. Concern for elevated blood pressure with Vyvanse  use. - Check blood pressure at work over the weekend. - Avoid caffeine when taking Vyvanse . - If blood pressure is elevated, increase Benicar  to 40 mg would be plan if over 140/90. - Continue chlorthalidone  25 mg daily.  Hypertension Hypertension managed with chlorthalidone  and Benicar . Current blood pressure 138/88 mmHg, possibly affected by caffeine. continue chlorthalidone . - Check blood pressure at work over the weekend. - Avoid caffeine when taking Vyvanse . - If blood pressure is elevated, increase Benicar  to 40 mg. - Order metabolic panel to monitor hypertension management.  Psoriasis of scalp and  behind ears Psoriasis managed with clobetasol  and triamcinolone . Skyrizi  effective but discontinued due to cost. - Prescribe clobetasol  solution for scalp use twice weekly. - Prescribe triamcinolone  for use behind the ears as needed.(i think this better option as less side effects) - Consider resuming Skyrizi  if financially feasible.  Follow up 6 months or sooner if needed

## 2023-11-02 ENCOUNTER — Other Ambulatory Visit (HOSPITAL_BASED_OUTPATIENT_CLINIC_OR_DEPARTMENT_OTHER): Payer: Self-pay

## 2023-11-02 ENCOUNTER — Ambulatory Visit: Payer: Self-pay | Admitting: Medical

## 2023-11-02 LAB — COMPREHENSIVE METABOLIC PANEL WITH GFR
ALT: 53 U/L — ABNORMAL HIGH (ref 0–35)
AST: 35 U/L (ref 0–37)
Albumin: 4.7 g/dL (ref 3.5–5.2)
Alkaline Phosphatase: 99 U/L (ref 39–117)
BUN: 14 mg/dL (ref 6–23)
CO2: 30 meq/L (ref 19–32)
Calcium: 10.1 mg/dL (ref 8.4–10.5)
Chloride: 95 meq/L — ABNORMAL LOW (ref 96–112)
Creatinine, Ser: 0.72 mg/dL (ref 0.40–1.20)
GFR: 90.48 mL/min (ref >60.00)
Glucose, Bld: 100 mg/dL — ABNORMAL HIGH (ref 70–99)
Potassium: 4.2 meq/L (ref 3.5–5.1)
Sodium: 140 meq/L (ref 135–145)
Total Bilirubin: 0.5 mg/dL (ref 0.2–1.2)
Total Protein: 7.3 g/dL (ref 6.0–8.3)

## 2023-11-02 MED ORDER — MELOXICAM 7.5 MG PO TABS
7.5000 mg | ORAL_TABLET | Freq: Every day | ORAL | 0 refills | Status: DC
  Filled 2023-11-02: qty 30, 15d supply, fill #0

## 2023-11-06 ENCOUNTER — Other Ambulatory Visit: Payer: Self-pay | Admitting: Medical

## 2023-11-06 ENCOUNTER — Other Ambulatory Visit (HOSPITAL_BASED_OUTPATIENT_CLINIC_OR_DEPARTMENT_OTHER): Payer: Self-pay

## 2023-11-06 ENCOUNTER — Other Ambulatory Visit: Payer: Self-pay

## 2023-11-06 LAB — QUANTIFERON-TB GOLD PLUS
Mitogen-NIL: 7.36 [IU]/mL
NIL: 0.02 [IU]/mL
QuantiFERON-TB Gold Plus: NEGATIVE
TB1-NIL: 0.1 [IU]/mL
TB2-NIL: 0.1 [IU]/mL

## 2023-11-06 MED ORDER — POTASSIUM CHLORIDE ER 10 MEQ PO TBCR
10.0000 meq | EXTENDED_RELEASE_TABLET | ORAL | 0 refills | Status: DC
  Filled 2023-11-06: qty 12, 28d supply, fill #0

## 2023-11-06 NOTE — Progress Notes (Signed)
 Seen by patient [name] on 11/06/2023  3:34 PM

## 2023-11-15 ENCOUNTER — Other Ambulatory Visit: Payer: Self-pay | Admitting: Medical

## 2023-11-15 ENCOUNTER — Other Ambulatory Visit (HOSPITAL_BASED_OUTPATIENT_CLINIC_OR_DEPARTMENT_OTHER): Payer: Self-pay

## 2023-11-15 ENCOUNTER — Other Ambulatory Visit: Payer: Self-pay

## 2023-11-28 ENCOUNTER — Other Ambulatory Visit (HOSPITAL_BASED_OUTPATIENT_CLINIC_OR_DEPARTMENT_OTHER): Payer: Self-pay

## 2023-11-28 ENCOUNTER — Other Ambulatory Visit: Payer: Self-pay | Admitting: Medical

## 2023-11-28 MED ORDER — MELOXICAM 7.5 MG PO TABS
7.5000 mg | ORAL_TABLET | Freq: Every day | ORAL | 0 refills | Status: DC
  Filled 2023-11-28: qty 30, 15d supply, fill #0

## 2023-11-28 NOTE — Telephone Encounter (Signed)
Rx refill meloxicam sent to pt pharmacy.

## 2023-12-02 ENCOUNTER — Other Ambulatory Visit: Payer: Self-pay | Admitting: Medical

## 2023-12-03 MED ORDER — LISDEXAMFETAMINE DIMESYLATE 50 MG PO CAPS
50.0000 mg | ORAL_CAPSULE | Freq: Every day | ORAL | 0 refills | Status: DC
  Filled 2023-12-03: qty 30, 30d supply, fill #0

## 2023-12-03 NOTE — Telephone Encounter (Signed)
Rx refill sent to pt pharmacy 

## 2023-12-04 ENCOUNTER — Other Ambulatory Visit (HOSPITAL_BASED_OUTPATIENT_CLINIC_OR_DEPARTMENT_OTHER): Payer: Self-pay

## 2023-12-13 ENCOUNTER — Other Ambulatory Visit: Payer: Self-pay | Admitting: Medical

## 2023-12-13 ENCOUNTER — Other Ambulatory Visit (HOSPITAL_BASED_OUTPATIENT_CLINIC_OR_DEPARTMENT_OTHER): Payer: Self-pay

## 2023-12-13 ENCOUNTER — Other Ambulatory Visit: Payer: Self-pay

## 2023-12-13 MED ORDER — CLOBETASOL PROPIONATE 0.05 % EX SOLN
1.0000 | Freq: Two times a day (BID) | CUTANEOUS | 0 refills | Status: DC | PRN
  Filled 2023-12-13 – 2023-12-14 (×2): qty 50, 25d supply, fill #0

## 2023-12-13 MED ORDER — POTASSIUM CHLORIDE ER 10 MEQ PO TBCR
10.0000 meq | EXTENDED_RELEASE_TABLET | ORAL | 0 refills | Status: DC
  Filled 2023-12-13 – 2023-12-14 (×2): qty 12, 28d supply, fill #0

## 2023-12-13 MED ORDER — OMEPRAZOLE 20 MG PO CPDR
20.0000 mg | DELAYED_RELEASE_CAPSULE | Freq: Two times a day (BID) | ORAL | 6 refills | Status: AC
  Filled 2023-12-13 – 2023-12-14 (×2): qty 60, 30d supply, fill #0
  Filled 2024-01-12: qty 60, 30d supply, fill #1
  Filled 2024-02-11: qty 60, 30d supply, fill #2
  Filled 2024-03-14: qty 60, 30d supply, fill #3
  Filled 2024-04-08: qty 60, 30d supply, fill #4

## 2023-12-14 ENCOUNTER — Other Ambulatory Visit: Payer: Self-pay

## 2023-12-14 ENCOUNTER — Other Ambulatory Visit (HOSPITAL_BASED_OUTPATIENT_CLINIC_OR_DEPARTMENT_OTHER): Payer: Self-pay

## 2023-12-17 ENCOUNTER — Other Ambulatory Visit: Payer: Self-pay | Admitting: Medical

## 2023-12-17 ENCOUNTER — Other Ambulatory Visit (HOSPITAL_BASED_OUTPATIENT_CLINIC_OR_DEPARTMENT_OTHER): Payer: Self-pay

## 2023-12-17 ENCOUNTER — Other Ambulatory Visit: Payer: Self-pay

## 2023-12-17 MED ORDER — MELOXICAM 7.5 MG PO TABS
7.5000 mg | ORAL_TABLET | Freq: Every day | ORAL | 0 refills | Status: DC
  Filled 2023-12-17: qty 30, 15d supply, fill #0

## 2023-12-19 ENCOUNTER — Other Ambulatory Visit (HOSPITAL_BASED_OUTPATIENT_CLINIC_OR_DEPARTMENT_OTHER): Payer: Self-pay

## 2024-01-03 ENCOUNTER — Other Ambulatory Visit: Payer: Self-pay | Admitting: Medical

## 2024-01-05 MED ORDER — LISDEXAMFETAMINE DIMESYLATE 50 MG PO CAPS
50.0000 mg | ORAL_CAPSULE | Freq: Every day | ORAL | 0 refills | Status: DC
  Filled 2024-01-05: qty 30, 30d supply, fill #0

## 2024-01-05 NOTE — Telephone Encounter (Signed)
 Rx sent to pharmacy. Follow up appt early January to update contact and give uds.

## 2024-01-06 ENCOUNTER — Other Ambulatory Visit (HOSPITAL_BASED_OUTPATIENT_CLINIC_OR_DEPARTMENT_OTHER): Payer: Self-pay

## 2024-01-07 ENCOUNTER — Other Ambulatory Visit (HOSPITAL_BASED_OUTPATIENT_CLINIC_OR_DEPARTMENT_OTHER): Payer: Self-pay

## 2024-01-10 ENCOUNTER — Other Ambulatory Visit: Payer: Self-pay | Admitting: Medical

## 2024-01-10 MED ORDER — MELOXICAM 7.5 MG PO TABS
7.5000 mg | ORAL_TABLET | Freq: Every day | ORAL | 0 refills | Status: DC
  Filled 2024-01-10: qty 30, 15d supply, fill #0

## 2024-01-10 MED ORDER — POTASSIUM CHLORIDE ER 10 MEQ PO TBCR
10.0000 meq | EXTENDED_RELEASE_TABLET | ORAL | 0 refills | Status: DC
  Filled 2024-01-10: qty 12, 28d supply, fill #0

## 2024-01-11 ENCOUNTER — Other Ambulatory Visit (HOSPITAL_BASED_OUTPATIENT_CLINIC_OR_DEPARTMENT_OTHER): Payer: Self-pay

## 2024-01-12 ENCOUNTER — Other Ambulatory Visit: Payer: Self-pay | Admitting: Medical

## 2024-01-13 ENCOUNTER — Other Ambulatory Visit (HOSPITAL_BASED_OUTPATIENT_CLINIC_OR_DEPARTMENT_OTHER): Payer: Self-pay

## 2024-01-14 ENCOUNTER — Other Ambulatory Visit (HOSPITAL_BASED_OUTPATIENT_CLINIC_OR_DEPARTMENT_OTHER): Payer: Self-pay

## 2024-01-14 ENCOUNTER — Other Ambulatory Visit: Payer: Self-pay

## 2024-01-14 MED ORDER — DESVENLAFAXINE SUCCINATE ER 50 MG PO TB24
50.0000 mg | ORAL_TABLET | Freq: Every day | ORAL | 3 refills | Status: DC
  Filled 2024-01-14: qty 30, 30d supply, fill #0
  Filled 2024-02-09: qty 30, 30d supply, fill #1
  Filled 2024-03-14: qty 30, 30d supply, fill #2
  Filled 2024-04-08: qty 30, 30d supply, fill #3

## 2024-01-28 ENCOUNTER — Ambulatory Visit: Admitting: Medical

## 2024-02-04 ENCOUNTER — Other Ambulatory Visit: Payer: Self-pay | Admitting: Medical

## 2024-02-04 ENCOUNTER — Other Ambulatory Visit (HOSPITAL_BASED_OUTPATIENT_CLINIC_OR_DEPARTMENT_OTHER): Payer: Self-pay

## 2024-02-04 ENCOUNTER — Other Ambulatory Visit: Payer: Self-pay

## 2024-02-09 ENCOUNTER — Other Ambulatory Visit: Payer: Self-pay | Admitting: Medical

## 2024-02-09 MED ORDER — MELOXICAM 7.5 MG PO TABS
7.5000 mg | ORAL_TABLET | Freq: Every day | ORAL | 0 refills | Status: DC
  Filled 2024-02-09: qty 30, 15d supply, fill #0

## 2024-02-09 NOTE — Telephone Encounter (Signed)
 Rx refill sent to pharmacy.

## 2024-02-11 ENCOUNTER — Other Ambulatory Visit: Payer: Self-pay

## 2024-02-11 ENCOUNTER — Other Ambulatory Visit (HOSPITAL_BASED_OUTPATIENT_CLINIC_OR_DEPARTMENT_OTHER): Payer: Self-pay

## 2024-02-11 ENCOUNTER — Other Ambulatory Visit: Payer: Self-pay | Admitting: Medical

## 2024-02-11 MED ORDER — GABAPENTIN 300 MG PO CAPS
300.0000 mg | ORAL_CAPSULE | Freq: Three times a day (TID) | ORAL | 0 refills | Status: AC
  Filled 2024-02-11 (×2): qty 270, 90d supply, fill #0

## 2024-02-11 MED ORDER — LISDEXAMFETAMINE DIMESYLATE 50 MG PO CAPS
50.0000 mg | ORAL_CAPSULE | Freq: Every day | ORAL | 0 refills | Status: DC
  Filled 2024-02-11: qty 30, 30d supply, fill #0

## 2024-02-11 MED ORDER — POTASSIUM CHLORIDE ER 10 MEQ PO TBCR
10.0000 meq | EXTENDED_RELEASE_TABLET | ORAL | 0 refills | Status: AC
  Filled 2024-02-11: qty 36, 84d supply, fill #0

## 2024-02-11 NOTE — Telephone Encounter (Signed)
 Requesting: Vyvanse  50mg  Contract:04/12/23 UDS:04/03/23 Last Visit: 11/01/23 Next Visit: None Last Refill: 01/05/24 #30 and 0RF   Please Advise

## 2024-02-11 NOTE — Telephone Encounter (Signed)
 Rx vyvanse  refill sent. Have pt follow up with me in January for controlled med visit.

## 2024-02-12 ENCOUNTER — Other Ambulatory Visit (HOSPITAL_BASED_OUTPATIENT_CLINIC_OR_DEPARTMENT_OTHER): Payer: Self-pay

## 2024-02-12 ENCOUNTER — Ambulatory Visit: Admitting: Orthopaedic Surgery

## 2024-03-09 ENCOUNTER — Other Ambulatory Visit: Payer: Self-pay | Admitting: Medical

## 2024-03-10 ENCOUNTER — Other Ambulatory Visit (HOSPITAL_BASED_OUTPATIENT_CLINIC_OR_DEPARTMENT_OTHER): Payer: Self-pay

## 2024-03-10 MED ORDER — MELOXICAM 7.5 MG PO TABS
7.5000 mg | ORAL_TABLET | Freq: Every day | ORAL | 0 refills | Status: DC
  Filled 2024-03-10: qty 30, 15d supply, fill #0

## 2024-03-11 ENCOUNTER — Other Ambulatory Visit: Payer: Self-pay | Admitting: Medical

## 2024-03-11 ENCOUNTER — Other Ambulatory Visit (HOSPITAL_BASED_OUTPATIENT_CLINIC_OR_DEPARTMENT_OTHER): Payer: Self-pay

## 2024-03-11 ENCOUNTER — Telehealth: Payer: Self-pay

## 2024-03-11 MED ORDER — LISDEXAMFETAMINE DIMESYLATE 50 MG PO CAPS
50.0000 mg | ORAL_CAPSULE | Freq: Every day | ORAL | 0 refills | Status: DC
  Filled 2024-03-11: qty 30, 30d supply, fill #0

## 2024-03-11 NOTE — Telephone Encounter (Signed)
 Notified pt via vm that she needs to schedule an appointment for her yearly contract and uds in order to further receive medication

## 2024-03-11 NOTE — Telephone Encounter (Signed)
 Pt need to do uds and contract before 04-12-2023. Think best to do those with controlled med visit.

## 2024-03-14 ENCOUNTER — Other Ambulatory Visit (HOSPITAL_BASED_OUTPATIENT_CLINIC_OR_DEPARTMENT_OTHER): Payer: Self-pay

## 2024-03-17 ENCOUNTER — Other Ambulatory Visit (HOSPITAL_BASED_OUTPATIENT_CLINIC_OR_DEPARTMENT_OTHER): Payer: Self-pay | Admitting: Medical

## 2024-03-17 DIAGNOSIS — Z1231 Encounter for screening mammogram for malignant neoplasm of breast: Secondary | ICD-10-CM

## 2024-03-18 ENCOUNTER — Inpatient Hospital Stay (HOSPITAL_BASED_OUTPATIENT_CLINIC_OR_DEPARTMENT_OTHER): Admission: RE | Admit: 2024-03-18 | Admitting: Radiology

## 2024-03-18 ENCOUNTER — Other Ambulatory Visit: Payer: Self-pay

## 2024-03-18 DIAGNOSIS — Z1231 Encounter for screening mammogram for malignant neoplasm of breast: Secondary | ICD-10-CM

## 2024-04-01 ENCOUNTER — Ambulatory Visit: Admitting: Medical

## 2024-04-04 ENCOUNTER — Other Ambulatory Visit (HOSPITAL_BASED_OUTPATIENT_CLINIC_OR_DEPARTMENT_OTHER): Payer: Self-pay

## 2024-04-04 ENCOUNTER — Other Ambulatory Visit: Payer: Self-pay | Admitting: Medical

## 2024-04-04 MED ORDER — MELOXICAM 7.5 MG PO TABS
7.5000 mg | ORAL_TABLET | Freq: Every day | ORAL | 0 refills | Status: DC
  Filled 2024-04-04: qty 30, 15d supply, fill #0

## 2024-04-08 ENCOUNTER — Other Ambulatory Visit (HOSPITAL_BASED_OUTPATIENT_CLINIC_OR_DEPARTMENT_OTHER): Payer: Self-pay

## 2024-04-11 ENCOUNTER — Other Ambulatory Visit (HOSPITAL_BASED_OUTPATIENT_CLINIC_OR_DEPARTMENT_OTHER): Payer: Self-pay

## 2024-04-15 ENCOUNTER — Other Ambulatory Visit (HOSPITAL_BASED_OUTPATIENT_CLINIC_OR_DEPARTMENT_OTHER): Payer: Self-pay

## 2024-04-15 ENCOUNTER — Ambulatory Visit: Admitting: Medical

## 2024-04-15 ENCOUNTER — Other Ambulatory Visit: Payer: Self-pay

## 2024-04-15 VITALS — BP 138/70 | HR 100 | Temp 97.8°F | Resp 15 | Ht 63.0 in | Wt 242.4 lb

## 2024-04-15 DIAGNOSIS — L409 Psoriasis, unspecified: Secondary | ICD-10-CM

## 2024-04-15 DIAGNOSIS — Z1211 Encounter for screening for malignant neoplasm of colon: Secondary | ICD-10-CM

## 2024-04-15 DIAGNOSIS — F32A Depression, unspecified: Secondary | ICD-10-CM

## 2024-04-15 DIAGNOSIS — F988 Other specified behavioral and emotional disorders with onset usually occurring in childhood and adolescence: Secondary | ICD-10-CM

## 2024-04-15 DIAGNOSIS — E785 Hyperlipidemia, unspecified: Secondary | ICD-10-CM

## 2024-04-15 DIAGNOSIS — K219 Gastro-esophageal reflux disease without esophagitis: Secondary | ICD-10-CM

## 2024-04-15 DIAGNOSIS — Z79899 Other long term (current) drug therapy: Secondary | ICD-10-CM

## 2024-04-15 DIAGNOSIS — I10 Essential (primary) hypertension: Secondary | ICD-10-CM | POA: Diagnosis not present

## 2024-04-15 MED ORDER — OLMESARTAN MEDOXOMIL 20 MG PO TABS
20.0000 mg | ORAL_TABLET | Freq: Every day | ORAL | 3 refills | Status: AC
  Filled 2024-04-15: qty 90, 90d supply, fill #0

## 2024-04-15 MED ORDER — OMEPRAZOLE 20 MG PO CPDR
20.0000 mg | DELAYED_RELEASE_CAPSULE | Freq: Every day | ORAL | 3 refills | Status: AC
  Filled 2024-04-15: qty 90, 90d supply, fill #0

## 2024-04-15 MED ORDER — LISDEXAMFETAMINE DIMESYLATE 50 MG PO CAPS
50.0000 mg | ORAL_CAPSULE | Freq: Every day | ORAL | 0 refills | Status: AC
  Filled 2024-04-15: qty 30, 30d supply, fill #0

## 2024-04-15 MED ORDER — DESVENLAFAXINE SUCCINATE ER 50 MG PO TB24: 50.0000 mg | ORAL_TABLET | Freq: Every day | ORAL | 3 refills | Status: AC

## 2024-04-15 MED ORDER — CLOBETASOL PROPIONATE 0.05 % EX SOLN
1.0000 | Freq: Two times a day (BID) | CUTANEOUS | 0 refills | Status: AC | PRN
  Filled 2024-04-15: qty 50, 25d supply, fill #0

## 2024-04-15 NOTE — Patient Instructions (Signed)
 Psoriasis Chronic psoriasis affecting scalp, buttocks, and abdomen. Skyrizi  effective but discontinued due to TB concerns. Clobetasol   solutionused with limited frequency on scalp. Otezla  less effective. - Refilled clobetasol  for scalp use. - Discussed Skyrizi  with Quantiferon Gold testing for latent TB.(thru derm if you decide to restart)  Essential hypertension Blood pressure controlled on olmesartan  and chlorthalidone . Mild tachycardia likely from Vyvanse . - Continue olmesartan  and chlorthalidone . - Monitor heart rate during work hours for medication adjustment.  Major depressive disorder Well-managed on Pristiq  50 mg daily. - Continue Pristiq  50 mg daily.  Attention deficit disorder Managed with Vyvanse  50 mg daily. Mild tachycardia and sleep disturbances reported. - Continue Vyvanse  50 mg daily. - Monitor heart rate and sleep patterns.  Gastroesophageal reflux disease - Continue omeprazole  as prescribed.  Hyperlipidemia Previous mild hyperlipidemia noted in 2021. No current statin therapy. - Order lipid panel during next visit in August.  Colon cancer screening Due for colonoscopy. - Encouraged scheduling of colonoscopy.  Follow up august for wellness exam

## 2024-04-15 NOTE — Progress Notes (Signed)
" ° °  Subjective:    Patient ID: [name], female    DOB: 07-Jan-1961, 64 y.o.   MRN: [id number]  HPI  [name] is a 64 year old female with psoriasis who presents for a medication refill and management of her condition.  She has chronic psoriasis involving scalp, buttocks, and abdomen, with scalp symptoms most bothersome and described as intensely pruritic. She uses clobetasol  solution to the scalp in short courses about every two weeks and has only a small amount remaining, requesting a refill.  She was on Skyrizi  for about a year with complete clearance but stopped due to the need for ongoing Quantiferon Gold monitoring after a prior TB exposure. She also tried Otezla , which improved belly and scalp lesions but did not fully control scalp psoriasis.  She takes olmesartan  and chlorthalidone  for hypertension. She has mild tachycardia and uses Vyvanse  sparingly due to limited supply. Vyvanse  worsens her sleep in the setting of longstanding insomnia, prior night shift work, and menopause.  She takes Pristiq  50 mg daily for depression and works in a stressful environment. She denies suicidal or homicidal ideation. She also uses omeprazole  and meloxicam  as needed.  Thalia is well controlled. Need refill ppi.        Review of Systems See hpi      Objective:   Physical Exam  General Mental Status- Alert. General Appearance- Not in acute distress.   Skin General: Color- Normal Color. Moisture- Normal Moisture.  Neck Carotid Arteries- Normal color. Moisture- Normal Moisture. No carotid bruits. No JVD.  Chest and Lung Exam Auscultation: Breath Sounds:-Normal.  Cardiovascular Auscultation:Rythm- Regular. Murmurs & Other Heart Sounds:Auscultation of the heart reveals- No Murmurs.  Abdomen Inspection:-Inspeection Normal. Palpation/Percussion:Note:No mass. Palpation and Percussion of the abdomen reveal- Non Tender, Non Distended + BS, no rebound or  guarding.   Neurologic Cranial Nerve exam:- CN III-XII intact(No nystagmus), symmetric smile. Strength:- 5/5 equal and symmetric strength both upper and lower extremities.         Assessment & Plan:   Psoriasis Chronic psoriasis affecting scalp, buttocks, and abdomen. Skyrizi  effective but discontinued due to TB concerns. Clobetasol   solutionused with limited frequency on scalp. Otezla  less effective. - Refilled clobetasol  for scalp use. - Discussed Skyrizi  with Quantiferon Gold testing for latent TB.(thru derm if you decide to restart)  Essential hypertension Blood pressure controlled on olmesartan  and chlorthalidone . Mild tachycardia likely from Vyvanse . - Continue olmesartan  and chlorthalidone . - Monitor heart rate during work hours for medication adjustment.  Major depressive disorder Well-managed on Pristiq  50 mg daily. - Continue Pristiq  50 mg daily.  Attention deficit disorder Managed with Vyvanse  50 mg daily. Mild tachycardia and sleep disturbances reported. - Continue Vyvanse  50 mg daily. - Monitor heart rate and sleep patterns.  Gastroesophageal reflux disease - Continue omeprazole  as prescribed.  Hyperlipidemia Previous mild hyperlipidemia noted in 2021. No current statin therapy. - Order lipid panel during next visit in August.  Colon cancer screening Due for colonoscopy. - Encouraged scheduling of colonoscopy.  Follow up august for wellness exam   Dallas Maxwell, PA-C  "

## 2024-04-17 ENCOUNTER — Ambulatory Visit: Payer: Self-pay | Admitting: Medical

## 2024-04-17 LAB — DM TEMPLATE

## 2024-04-18 LAB — DRUG MONITORING PANEL 376104, URINE
Barbiturates: NEGATIVE ng/mL
Benzodiazepines: NEGATIVE ng/mL
Cocaine Metabolite: NEGATIVE ng/mL
Desmethyltramadol: NEGATIVE ng/mL
Methamphetamine: NEGATIVE ng/mL
Opiates: NEGATIVE ng/mL
Oxycodone: NEGATIVE ng/mL
Tramadol Comments: 1739 ng/mL — AB
Tramadol: NEGATIVE ng/mL
Tramadol: POSITIVE ng/mL — AB

## 2024-04-18 LAB — DM TEMPLATE

## 2024-04-21 ENCOUNTER — Other Ambulatory Visit: Payer: Self-pay

## 2024-04-27 ENCOUNTER — Other Ambulatory Visit: Payer: Self-pay | Admitting: Medical

## 2024-04-28 MED ORDER — MELOXICAM 7.5 MG PO TABS
7.5000 mg | ORAL_TABLET | Freq: Every day | ORAL | 0 refills | Status: AC
  Filled 2024-04-28: qty 30, 15d supply, fill #0

## 2024-04-29 ENCOUNTER — Other Ambulatory Visit: Payer: Self-pay | Admitting: Medical

## 2024-04-29 ENCOUNTER — Other Ambulatory Visit (HOSPITAL_BASED_OUTPATIENT_CLINIC_OR_DEPARTMENT_OTHER): Payer: Self-pay
